# Patient Record
Sex: Female | Born: 1990 | Race: Black or African American | Hispanic: No | Marital: Single | Smoking: Current every day smoker
Health system: Southern US, Community
[De-identification: ages and names within clinical notes are randomized; demographics above are authoritative.]

## PROBLEM LIST (undated history)

## (undated) ENCOUNTER — Inpatient Hospital Stay (HOSPITAL_COMMUNITY): Payer: Self-pay

## (undated) DIAGNOSIS — Z789 Other specified health status: Secondary | ICD-10-CM

## (undated) DIAGNOSIS — D649 Anemia, unspecified: Secondary | ICD-10-CM

## (undated) HISTORY — PX: NO PAST SURGERIES: SHX2092

---
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
## 2012-02-29 ENCOUNTER — Emergency Department (HOSPITAL_COMMUNITY)
Admission: EM | Admit: 2012-02-29 | Discharge: 2012-02-29 | Disposition: A | Discharge status: Home / Self Care | Payer: Medicaid Other | Attending: Emergency Medicine | Admitting: Emergency Medicine

## 2012-02-29 ENCOUNTER — Emergency Department (HOSPITAL_COMMUNITY): Payer: Medicaid Other

## 2012-02-29 ENCOUNTER — Encounter (HOSPITAL_COMMUNITY): Payer: Self-pay | Admitting: Family Medicine

## 2012-02-29 DIAGNOSIS — R05 Cough: Secondary | ICD-10-CM | POA: Insufficient documentation

## 2012-02-29 DIAGNOSIS — IMO0001 Reserved for inherently not codable concepts without codable children: Secondary | ICD-10-CM | POA: Insufficient documentation

## 2012-02-29 DIAGNOSIS — Z79899 Other long term (current) drug therapy: Secondary | ICD-10-CM | POA: Insufficient documentation

## 2012-02-29 DIAGNOSIS — J029 Acute pharyngitis, unspecified: Secondary | ICD-10-CM | POA: Insufficient documentation

## 2012-02-29 DIAGNOSIS — R6889 Other general symptoms and signs: Secondary | ICD-10-CM

## 2012-02-29 DIAGNOSIS — R059 Cough, unspecified: Secondary | ICD-10-CM | POA: Insufficient documentation

## 2012-02-29 LAB — RAPID STREP SCREEN (MED CTR MEBANE ONLY): Streptococcus, Group A Screen (Direct): NEGATIVE

## 2012-02-29 MED ORDER — NAPROXEN 500 MG PO TABS: 500.0000 mg | ORAL_TABLET | Freq: Two times a day (BID) | ORAL | Status: DC

## 2012-02-29 MED ORDER — HYDROCODONE-HOMATROPINE 5-1.5 MG/5ML PO SYRP: 5.0000 mL | ORAL_SOLUTION | Freq: Four times a day (QID) | ORAL | Status: DC | PRN

## 2012-02-29 NOTE — ED Provider Notes (Signed)
History    Scribed for Kristina Cooper III, MD, the patient was seen in room TR06C/TR06C . This chart was scribed by Lewanda Rife.  CSN: [id number]  Arrival date & time 02/29/12  1732   First MD Initiated Contact with Patient 02/29/12 1811      Chief Complaint  Patient presents with  . Cough  . Sore Throat    (Consider location/radiation/quality/duration/timing/severity/associated sxs/prior treatment) HPI Kristina Alvarado is a 22 y.o. female who presents to the Emergency Department complaining of moderate constant generalized myalgias, fatigue, sore throat, non-productive dry cough onset 4 days. Pt denies nausea, vomiting, fever, and diarrhea. Pt denies taking any medications at home to treat symptoms. Pt reports she did not get a flu shot this year.   History reviewed. No pertinent past medical history.  History reviewed. No pertinent past surgical history.  History reviewed. No pertinent family history.  History  Substance Use Topics  . Smoking status: Never Smoker   . Smokeless tobacco: Not on file  . Alcohol Use: No    OB History    Grav Para Term Preterm Abortions TAB SAB Ect Mult Living                  Review of Systems  Constitutional: Negative.  Negative for fever.  HENT: Positive for sore throat. Negative for neck stiffness.   Respiratory: Positive for cough.   Cardiovascular: Negative.  Negative for chest pain.  Gastrointestinal: Negative.  Negative for nausea, vomiting, abdominal pain and diarrhea.  Musculoskeletal: Positive for myalgias (generalized).  Skin: Negative.   Neurological: Negative.   Hematological: Negative.   Psychiatric/Behavioral: Negative.   All other systems reviewed and are negative.    Allergies  Review of patient's allergies indicates no known allergies.  Home Medications   Current Outpatient Rx  Name  Route  Sig  Dispense  Refill  . ACETAMINOPHEN 500 MG PO TABS   Oral   Take 500 mg by mouth every 6 (six) hours as  needed. For pain         . NORGESTIMATE-ETH ESTRADIOL 0.25-35 MG-MCG PO TABS   Oral   Take 1 tablet by mouth daily.         . NYQUIL PO   Oral   Take 30 mLs by mouth daily as needed. For symptoms           BP 121/60  Pulse 69  Temp 98 F (36.7 C)  Resp 18  SpO2 99%  LMP 02/10/2012  Physical Exam  Nursing note and vitals reviewed. Constitutional: She is oriented to person, place, and time. She appears well-developed and well-nourished. No distress.  HENT:  Head: Normocephalic and atraumatic. No trismus in the jaw.  Right Ear: Tympanic membrane, external ear and ear canal normal.  Left Ear: Tympanic membrane, external ear and ear canal normal.  Nose: Nose normal. No rhinorrhea. Right sinus exhibits no maxillary sinus tenderness and no frontal sinus tenderness. Left sinus exhibits no maxillary sinus tenderness and no frontal sinus tenderness.  Mouth/Throat: Uvula is midline and mucous membranes are normal. Normal dentition. No dental abscesses or uvula swelling. Posterior oropharyngeal edema present. No oropharyngeal exudate, posterior oropharyngeal erythema or tonsillar abscesses.       No submental edema, tongue not elevated, no trismus. No impending airway obstruction; Pt able to speak full sentences, swallow intact, no drooling, stridor, or tonsillar/uvula displacement. No palatal petechia  Eyes: Conjunctivae normal are normal.  Neck: Trachea normal, normal range of motion and full passive  range of motion without pain. Neck supple. No rigidity. Normal range of motion present. No Brudzinski's sign noted.       Flexion and extension of neck without pain or difficulty. Able to breath without difficulty in extension.  Cardiovascular: Normal rate and regular rhythm.   Pulmonary/Chest: Effort normal and breath sounds normal. No stridor. No respiratory distress. She has no wheezes.  Abdominal: Soft. There is no tenderness.       No obvious evidence of splenomegaly. Non ttp.     Musculoskeletal: Normal range of motion.  Lymphadenopathy:       Head (right side): No preauricular and no posterior auricular adenopathy present.       Head (left side): No preauricular and no posterior auricular adenopathy present.    She has no cervical adenopathy.  Neurological: She is alert and oriented to person, place, and time.  Skin: Skin is warm and dry. No rash noted. She is not diaphoretic.  Psychiatric: She has a normal mood and affect.    ED Course  Procedures (including critical care time)   Labs Reviewed  RAPID STREP SCREEN   No results found.   No diagnosis found.    MDM  Pt CXR negative for acute infiltrate. Patients symptoms are consistent with URI, likely viral etiology. Discussed that antibiotics are not indicated for viral infections. Pt will be discharged with symptomatic treatment.  Verbalizes understanding and is agreeable with plan. Pt is hemodynamically stable & in NAD prior to dc.       I personally performed the services described in this documentation, which was scribed in my presence. The recorded information has been reviewed and is accurate.      Jaci Carrel, [place] 02/29/12 1914

## 2012-02-29 NOTE — ED Notes (Signed)
Per pt sore throat, achy and cough.

## 2012-03-01 NOTE — ED Provider Notes (Signed)
Medical screening examination/treatment/procedure(s) were performed by non-physician practitioner and as supervising physician I was immediately available for consultation/collaboration.   [name], MD 03/01/12 [phone]

## 2012-09-19 ENCOUNTER — Emergency Department (HOSPITAL_COMMUNITY)
Admission: EM | Admit: 2012-09-19 | Discharge: 2012-09-19 | Disposition: A | Discharge status: Home / Self Care | Payer: Medicaid Other | Attending: Emergency Medicine | Admitting: Emergency Medicine

## 2012-09-19 ENCOUNTER — Encounter (HOSPITAL_COMMUNITY): Payer: Self-pay

## 2012-09-19 ENCOUNTER — Emergency Department (HOSPITAL_COMMUNITY): Payer: Medicaid Other

## 2012-09-19 DIAGNOSIS — M549 Dorsalgia, unspecified: Secondary | ICD-10-CM | POA: Insufficient documentation

## 2012-09-19 DIAGNOSIS — R0602 Shortness of breath: Secondary | ICD-10-CM | POA: Insufficient documentation

## 2012-09-19 DIAGNOSIS — J3489 Other specified disorders of nose and nasal sinuses: Secondary | ICD-10-CM | POA: Insufficient documentation

## 2012-09-19 DIAGNOSIS — R040 Epistaxis: Secondary | ICD-10-CM | POA: Insufficient documentation

## 2012-09-19 DIAGNOSIS — J40 Bronchitis, not specified as acute or chronic: Secondary | ICD-10-CM

## 2012-09-19 DIAGNOSIS — Z79899 Other long term (current) drug therapy: Secondary | ICD-10-CM | POA: Insufficient documentation

## 2012-09-19 DIAGNOSIS — J209 Acute bronchitis, unspecified: Secondary | ICD-10-CM | POA: Insufficient documentation

## 2012-09-19 MED ORDER — AZITHROMYCIN 250 MG PO TABS: 250.0000 mg | ORAL_TABLET | Freq: Every day | ORAL | Status: DC

## 2012-09-19 MED ORDER — BENZONATATE 100 MG PO CAPS: 100.0000 mg | ORAL_CAPSULE | Freq: Three times a day (TID) | ORAL | Status: DC

## 2012-09-19 MED ORDER — AZITHROMYCIN 250 MG PO TABS
500.0000 mg | ORAL_TABLET | Freq: Once | ORAL | Status: AC
  Administered 2012-09-19: 500 mg via ORAL
  Filled 2012-09-19: qty 2

## 2012-09-19 NOTE — ED Provider Notes (Signed)
CSN: [id number]     Arrival date & time 09/19/12  1646 History  This chart was scribed for Tommy Rainwater, non-physician practitioner working with Richardean Canal, MD by Leone Payor, ED Scribe. This patient was seen in room TR08C/TR08C and the patient's care was started at 1646.    Chief Complaint  Patient presents with  . Nasal Congestion  . Cough    The history is provided by the patient. No language interpreter was used.    HPI Comments: Kristina Alvarado is a 22 y.o. female who presents to the Emergency Department complaining of 7 days of gradual onset, gradually worsening, constant cough productive of white/yellow mucus. Pt has associated nasal congestion along with back pain and SOB while coughing. Pt also had a nosebleed that lasted about 3 minutes today. She has taken robitussin with minimal relief. She denies fevers. Pt denies history of asthma but both of her parents do. Pt denies smoking and alcohol use.   History reviewed. No pertinent past medical history. History reviewed. No pertinent past surgical history. History reviewed. No pertinent family history. History  Substance Use Topics  . Smoking status: Never Smoker   . Smokeless tobacco: Not on file  . Alcohol Use: No   OB History   Grav Para Term Preterm Abortions TAB SAB Ect Mult Living                 Review of Systems  Constitutional: Negative for fever.  HENT: Positive for nosebleeds (1 episode) and congestion.   Respiratory: Positive for cough and shortness of breath.   All other systems reviewed and are negative.    Allergies  Review of patient's allergies indicates no known allergies.  Home Medications   Current Outpatient Rx  Name  Route  Sig  Dispense  Refill  . camphor-phenol (CAMPHO-PHENIQUE) 10.8-4.7 % LIQD   Topical   Apply 1 application topically daily as needed (for cold sore).         . cetirizine (ZYRTEC) 10 MG tablet   Oral   Take 10 mg by mouth every other day.         .  ethynodiol-ethinyl estradiol (ZOVIA) 1-50 MG-MCG tablet   Oral   Take 1 tablet by mouth daily.         Marland Kitchen guaiFENesin (ROBITUSSIN) 100 MG/5ML SOLN   Oral   Take 200 mg by mouth every 4 (four) hours as needed (for cough).         Marland Kitchen ibuprofen (ADVIL,MOTRIN) 400 MG tablet   Oral   Take 400 mg by mouth 2 (two) times daily as needed for pain.          BP 143/60  Pulse 71  Temp(Src) 98.7 F (37.1 C) (Oral)  Resp 18  SpO2 100%  LMP 08/19/2012 Physical Exam  Nursing note and vitals reviewed. Constitutional: She is oriented to person, place, and time. She appears well-developed and well-nourished.  HENT:  Head: Normocephalic and atraumatic.  Right Ear: Tympanic membrane normal.  Left Ear: Tympanic membrane normal.  Mouth/Throat: Oropharynx is clear and moist.  Eyes: Conjunctivae and EOM are normal. Pupils are equal, round, and reactive to light.  Neck: Normal range of motion. Neck supple.  Cardiovascular: Normal rate, regular rhythm and normal heart sounds.   Pulmonary/Chest: Effort normal. She has no wheezes. She has rales (Diffuse rales bilaterally. ).  Actively coughing.  Abdominal: Soft. Bowel sounds are normal. She exhibits no distension.  Musculoskeletal: Normal range of motion.  Neurological: She is alert and oriented to person, place, and time.  Skin: Skin is warm and dry.  Psychiatric: She has a normal mood and affect.    ED Course   Procedures (including critical care time)  DIAGNOSTIC STUDIES: Oxygen Saturation is 100% on RA, normal by my interpretation.    COORDINATION OF CARE: 6:02 PM Discussed treatment plan with pt at bedside and pt agreed to plan.   Labs Reviewed - No data to display Dg Chest 2 View  09/19/2012   *RADIOLOGY REPORT*  Clinical Data: Cough, shortness of breath  CHEST - 2 VIEW  Comparison: 02/29/2012  Findings: Cardiomediastinal silhouette is within normal limits. The lungs are clear. No pleural effusion.  No pneumothorax.  No acute osseous  abnormality.  IMPRESSION: Normal chest.   Original Report Authenticated By: Christiana Pellant, M.D.   No diagnosis found. 1. Bronchitis  MDM  Will treat with abx given presentation and duration of symptoms. Otherwise, supportive care.   I personally performed the services described in this documentation, which was scribed in my presence. The recorded information has been reviewed and is accurate.     Arnoldo Hooker, PA-C 09/19/12 2305

## 2012-09-19 NOTE — Discharge Instructions (Signed)
Bronchitis  Bronchitis is a problem of the air tubes leading to your lungs. This problem makes it hard for air to get in and out of the lungs. You may cough a lot because your air tubes are narrow. Going without care can cause lasting (chronic) bronchitis.  HOME CARE    Drink enough fluids to keep your pee (urine) clear or pale yellow.   Use a cool mist humidifier.   Quit smoking if you smoke. If you keep smoking, the bronchitis might not get better.   Only take medicine as told by your doctor.  GET HELP RIGHT AWAY IF:    Coughing keeps you awake.   You start to wheeze.   You become more sick or weak.   You have a hard time breathing or get short of breath.   You cough up blood.   Coughing lasts more than 2 weeks.   You have a fever.   Your baby is older than 3 months with a rectal temperature of 102 F (38.9 C) or higher.   Your baby is 3 months old or younger with a rectal temperature of 100.4 F (38 C) or higher.  MAKE SURE YOU:   Understand these instructions.   Will watch your condition.   Will get help right away if you are not doing well or get worse.  Document Released: 07/16/2007 Document Revised: 04/21/2011 Document Reviewed: 12/29/2008  [employer] Patient Information 2014 [employer].

## 2012-09-19 NOTE — ED Notes (Signed)
Pt c/o productive cough with white/yellow colored mucus, nasal congestion, pian to her back and SOB when coughing, pt reports having a nosebleed today lasting 3 minutes

## 2012-09-20 NOTE — ED Provider Notes (Signed)
Medical screening examination/treatment/procedure(s) were performed by non-physician practitioner and as supervising physician I was immediately available for consultation/collaboration.   Richardean Canal, MD 09/20/12 0001

## 2013-01-13 ENCOUNTER — Ambulatory Visit: Payer: Self-pay | Admitting: Obstetrics & Gynecology

## 2013-01-19 ENCOUNTER — Ambulatory Visit: Payer: Self-pay | Admitting: Obstetrics & Gynecology

## 2014-02-06 ENCOUNTER — Encounter: Payer: Self-pay | Admitting: *Deleted

## 2014-12-03 ENCOUNTER — Emergency Department (HOSPITAL_COMMUNITY)
Admission: EM | Admit: 2014-12-03 | Discharge: 2014-12-03 | Disposition: A | Discharge status: Home / Self Care | Payer: Medicaid Other | Attending: Emergency Medicine | Admitting: Emergency Medicine

## 2014-12-03 ENCOUNTER — Encounter (HOSPITAL_COMMUNITY): Payer: Self-pay | Admitting: Emergency Medicine

## 2014-12-03 DIAGNOSIS — K0889 Other specified disorders of teeth and supporting structures: Secondary | ICD-10-CM

## 2014-12-03 DIAGNOSIS — Z792 Long term (current) use of antibiotics: Secondary | ICD-10-CM | POA: Insufficient documentation

## 2014-12-03 DIAGNOSIS — Z79899 Other long term (current) drug therapy: Secondary | ICD-10-CM | POA: Insufficient documentation

## 2014-12-03 DIAGNOSIS — Z3202 Encounter for pregnancy test, result negative: Secondary | ICD-10-CM | POA: Insufficient documentation

## 2014-12-03 LAB — POC URINE PREG, ED: PREG TEST UR: NEGATIVE

## 2014-12-03 MED ORDER — PENICILLIN V POTASSIUM 250 MG PO TABS: 250.0000 mg | ORAL_TABLET | Freq: Four times a day (QID) | ORAL | Status: AC

## 2014-12-03 NOTE — ED Notes (Signed)
Pt. Sytated, I've had toothache on both sides for 2 months.

## 2014-12-03 NOTE — ED Notes (Signed)
Declined W/C at D/C and was escorted to lobby by RN. 

## 2014-12-03 NOTE — ED Provider Notes (Signed)
CSN: [id number]     Arrival date & time 12/03/14  1118 History  By signing my name below, I, Placido SouLogan Joldersma, attest that this documentation has been prepared under the direction and in the presence of Teressa LowerVrinda Tyja Gortney, NP. Electronically Signed: Placido SouLogan Joldersma, ED Scribe. 12/03/2014. 12:34 PM.   Chief Complaint  Patient presents with  . Dental Problem   The history is provided by the patient. No language interpreter was used.    HPI Comments: Kristina Alvarado is a 24 y.o. female who presents to the Emergency Department complaining of moderate right lower and left upper dental pain with onset 1 month ago and worsening pain beginning yesterday. She notes worsening pain when chewing and denies having seen a dentist for these symptoms. Pt denies any known drug allergies and notes her LNMP was 2-3 months ago which she says is abnormal. She denies fever and trouble swallowing.   History reviewed. No pertinent past medical history. History reviewed. No pertinent past surgical history. No family history on file. Social History  Substance Use Topics  . Smoking status: Never Smoker   . Smokeless tobacco: None  . Alcohol Use: No   OB History    No data available     Review of Systems  HENT: Positive for dental problem.   All other systems reviewed and are negative.  Allergies  Review of patient's allergies indicates no known allergies.  Home Medications   Prior to Admission medications   Medication Sig Start Date End Date Taking? Authorizing Provider  azithromycin (ZITHROMAX Z-PAK) 250 MG tablet Take 1 tablet (250 mg total) by mouth daily. 09/19/12   Elpidio AnisShari Upstill, PA-C  benzonatate (TESSALON) 100 MG capsule Take 1 capsule (100 mg total) by mouth every 8 (eight) hours. 09/19/12   Elpidio AnisShari Upstill, PA-C  camphor-phenol (CAMPHO-PHENIQUE) 10.8-4.7 % LIQD Apply 1 application topically daily as needed (for cold sore).    Historical Provider, MD  cetirizine (ZYRTEC) 10 MG tablet Take 10 mg by  mouth every other day.    Historical Provider, MD  ethynodiol-ethinyl estradiol (ZOVIA) 1-50 MG-MCG tablet Take 1 tablet by mouth daily.    Historical Provider, MD  guaiFENesin (ROBITUSSIN) 100 MG/5ML SOLN Take 200 mg by mouth every 4 (four) hours as needed (for cough).    Historical Provider, MD  ibuprofen (ADVIL,MOTRIN) 400 MG tablet Take 400 mg by mouth 2 (two) times daily as needed for pain.    Historical Provider, MD   BP 122/76 mmHg  Pulse 67  Temp(Src) 98.7 F (37.1 C)  Resp 17  Ht 4' 11.5" (1.511 m)  Wt 201 lb (91.173 kg)  BMI 39.93 kg/m2  SpO2 98%  LMP 10/03/2014 Physical Exam  Constitutional: She is oriented to person, place, and time. She appears well-developed and well-nourished.  HENT:  Head: Normocephalic and atraumatic.  Right Ear: External ear normal.  Left Ear: External ear normal.  Mouth/Throat: No oropharyngeal exudate.  No swelling noted. Pt has multiple decayed or filled teeth  Neck: Normal range of motion. No tracheal deviation present.  Cardiovascular: Normal rate.   Pulmonary/Chest: Effort normal. No respiratory distress.  Abdominal: Soft. There is no tenderness.  Musculoskeletal: Normal range of motion.  Neurological: She is alert and oriented to person, place, and time.  Skin: Skin is warm and dry. She is not diaphoretic.  Psychiatric: She has a normal mood and affect. Her behavior is normal.  Nursing note and vitals reviewed.  ED Course  Procedures  DIAGNOSTIC STUDIES: Oxygen Saturation is 98% on  RA, normal by my interpretation.    COORDINATION OF CARE: 12:32 PM Pt presents today due to worsening right lower and left upper dental pain. Discussed treatment plan with pt at bedside including a UA, abx and a referral to a dental provider. Return precautions noted. Pt agreed to plan.  Labs Review Labs Reviewed - No data to display  Imaging Review No results found.   EKG Interpretation None      MDM   Final diagnoses:  Toothache    Pt  given pcn. No sign of ludwigs angina or abscess.  I personally performed the services described in this documentation, which was scribed in my presence. The recorded information has been reviewed and is accurate.    Teressa Lower, NP 12/03/14 1312  Richardean Canal, MD 12/03/14 1520

## 2015-08-12 ENCOUNTER — Emergency Department (HOSPITAL_COMMUNITY)
Admission: EM | Admit: 2015-08-12 | Discharge: 2015-08-12 | Disposition: A | Discharge status: Home / Self Care | Payer: Medicaid Other | Attending: Emergency Medicine | Admitting: Emergency Medicine

## 2015-08-12 ENCOUNTER — Encounter (HOSPITAL_COMMUNITY): Payer: Self-pay

## 2015-08-12 DIAGNOSIS — N644 Mastodynia: Secondary | ICD-10-CM | POA: Insufficient documentation

## 2015-08-12 LAB — POC URINE PREG, ED: Preg Test, Ur: NEGATIVE

## 2015-08-12 MED ORDER — DICLOFENAC SODIUM 1 % TD GEL: 2.0000 g | Freq: Four times a day (QID) | TRANSDERMAL | Status: DC

## 2015-08-12 MED ORDER — KETOROLAC TROMETHAMINE 60 MG/2ML IM SOLN
60.0000 mg | Freq: Once | INTRAMUSCULAR | Status: AC
  Administered 2015-08-12: 60 mg via INTRAMUSCULAR
  Filled 2015-08-12: qty 2

## 2015-08-12 NOTE — ED Notes (Signed)
Declined W/C at D/C and was escorted to lobby by RN. 

## 2015-08-12 NOTE — Discharge Instructions (Signed)
You have been seen today for breast tenderness. Your pregnancy test was negative. It is very important that you follow-up with OB/GYN as soon as possible. Call the number provided tomorrow to set up an appointment. Ibuprofen or naproxen for pain and inflammation. Alternatively, he may use the topical anti-inflammatory prescribed today. Do not use both topical anti-inflammatory and oral anti-inflammatory medications at the same time. Follow up with PCP as needed.

## 2015-08-12 NOTE — ED Provider Notes (Signed)
CSN: [id number]     Arrival date & time 08/12/15  0844 History   First MD Initiated Contact with Patient 08/12/15 [phone]     Chief Complaint  Patient presents with  . breast pain      (Consider location/radiation/quality/duration/timing/severity/associated sxs/prior Treatment) HPI   Kristina Alvarado is a 25 y.o. female, patient with no pertinent past medical history, presenting to the ED with Bilateral breast tenderness for the last 5 days. Patient states that it started with bilateral nipple sensitivity and spread to the rest of her breasts. Pain with lying prone and palpation. Patient has not tried anything for her symptoms. Denies fever/chills, nausea/vomiting, shortness of breath, nipple discharge, or any other complaints. LMP around May 25. Patient endorses irregular menses.   History reviewed. No pertinent past medical history. History reviewed. No pertinent past surgical history. No family history on file. Social History  Substance Use Topics  . Smoking status: Never Smoker   . Smokeless tobacco: None  . Alcohol Use: No   OB History    No data available     Review of Systems  Constitutional: Negative for fever and chills.  Respiratory: Negative for cough and shortness of breath.   Cardiovascular: Negative for chest pain.  Skin: Negative for color change, rash and wound.       Breast tenderness bilaterally.  All other systems reviewed and are negative.     Allergies  Review of patient's allergies indicates no known allergies.  Home Medications   Prior to Admission medications   Medication Sig Start Date End Date Taking? Authorizing Provider  azithromycin (ZITHROMAX Z-PAK) 250 MG tablet Take 1 tablet (250 mg total) by mouth daily. 09/19/12   Elpidio AnisShari Upstill, PA-C  benzonatate (TESSALON) 100 MG capsule Take 1 capsule (100 mg total) by mouth every 8 (eight) hours. 09/19/12   Elpidio AnisShari Upstill, PA-C  camphor-phenol (CAMPHO-PHENIQUE) 10.8-4.7 % LIQD Apply 1 application  topically daily as needed (for cold sore).    Historical Provider, MD  cetirizine (ZYRTEC) 10 MG tablet Take 10 mg by mouth every other day.    Historical Provider, MD  diclofenac sodium (VOLTAREN) 1 % GEL Apply 2 g topically 4 (four) times daily. 08/12/15   Mariadel Mruk C Abeer Deskins, PA-C  ethynodiol-ethinyl estradiol (ZOVIA) 1-50 MG-MCG tablet Take 1 tablet by mouth daily.    Historical Provider, MD  guaiFENesin (ROBITUSSIN) 100 MG/5ML SOLN Take 200 mg by mouth every 4 (four) hours as needed (for cough).    Historical Provider, MD  ibuprofen (ADVIL,MOTRIN) 400 MG tablet Take 400 mg by mouth 2 (two) times daily as needed for pain.    Historical Provider, MD   BP 129/71 mmHg  Pulse 70  Temp(Src) 99.2 F (37.3 C)  Resp 18  Ht 4\' 11"  (1.499 m)  Wt 87.408 kg  BMI 38.90 kg/m2  SpO2 99%  LMP 07/10/2015 Physical Exam  Constitutional: She appears well-developed and well-nourished. No distress.  HENT:  Head: Normocephalic and atraumatic.  Eyes: Conjunctivae are normal.  Neck: Neck supple.  Cardiovascular: Normal rate, regular rhythm, normal heart sounds and intact distal pulses.   Pulmonary/Chest: Effort normal and breath sounds normal. No respiratory distress.  Large, pendulous breasts. Relatively equal to one another. Diffuse, bilateral, exquisite breast tenderness. Tenderness seems to be limited to the breast tissue. Tenderness is elicited with light or deep palpation.  No obvious masses, areas of entrapment, or dimpling noted. No thickened skin, abnormal texture, or erythema noted. No nipple discharge. No specific area of fluctuance or induration.  Chest wall itself seems to be nontender. No tenderness in the axilla. RN, Magda PaganiniAudrey, served as chaperone during exam.  Abdominal: Soft. There is no tenderness. There is no guarding.  Musculoskeletal: She exhibits no edema.  Lymphadenopathy:    She has no cervical adenopathy.  Neurological: She is alert.  Skin: Skin is warm and dry. She is not diaphoretic.   Psychiatric: She has a normal mood and affect. Her behavior is normal.  Nursing note and vitals reviewed.   ED Course  Procedures (including critical care time) Labs Review Labs Reviewed  POC URINE PREG, ED    I have personally reviewed and evaluated these lab results as part of my medical decision-making.   EKG Interpretation None      MDM   Final diagnoses:  Breast pain    Kristina Alvarado presents with bilateral breast pain for the last 5 days.  Negative pregnancy test. No abnormalities other than tenderness found on exam. Patient advised to follow-up with OB/GYN as soon as possible for further testing. Resources given. Diclofenac gel for symptom control. Return precautions discussed. Patient voiced understanding instructions, accepts the plan, and is comfortable with discharge.      Anselm PancoastShawn C Leila Schuff, PA-C 08/12/15 [id number]  Mancel BaleElliott Wentz, MD 08/12/15 [phone]

## 2015-08-12 NOTE — ED Notes (Signed)
Patient complains of bilateral breast pain x 5 days, denies trauma, NAD

## 2016-07-16 ENCOUNTER — Encounter (HOSPITAL_COMMUNITY): Payer: Self-pay | Admitting: Emergency Medicine

## 2016-07-16 ENCOUNTER — Emergency Department (HOSPITAL_COMMUNITY)
Admission: EM | Admit: 2016-07-16 | Discharge: 2016-07-16 | Disposition: A | Discharge status: Home / Self Care | Payer: Medicaid Other | Attending: Emergency Medicine | Admitting: Emergency Medicine

## 2016-07-16 DIAGNOSIS — Z792 Long term (current) use of antibiotics: Secondary | ICD-10-CM | POA: Insufficient documentation

## 2016-07-16 DIAGNOSIS — Z79899 Other long term (current) drug therapy: Secondary | ICD-10-CM | POA: Insufficient documentation

## 2016-07-16 DIAGNOSIS — Z793 Long term (current) use of hormonal contraceptives: Secondary | ICD-10-CM | POA: Insufficient documentation

## 2016-07-16 DIAGNOSIS — L02411 Cutaneous abscess of right axilla: Secondary | ICD-10-CM | POA: Insufficient documentation

## 2016-07-16 MED ORDER — DOXYCYCLINE HYCLATE 100 MG PO CAPS: 100.0000 mg | ORAL_CAPSULE | Freq: Two times a day (BID) | ORAL | 0 refills | Status: DC

## 2016-07-16 MED ORDER — LIDOCAINE-EPINEPHRINE (PF) 2 %-1:200000 IJ SOLN
10.0000 mL | Freq: Once | INTRAMUSCULAR | Status: AC
  Administered 2016-07-16: 10 mL via INTRADERMAL
  Filled 2016-07-16: qty 20

## 2016-07-16 MED ORDER — IBUPROFEN 600 MG PO TABS: 600.0000 mg | ORAL_TABLET | Freq: Four times a day (QID) | ORAL | 0 refills | Status: DC | PRN

## 2016-07-16 NOTE — ED Provider Notes (Signed)
[facility]-EMERGENCY DEPT Provider Note   CSN: [id number] Arrival date & time: 07/16/16  1356   By signing my name below, I, Kristina Alvarado, attest that this documentation has been prepared under the direction and in the presence of Tank Difiore, PA-C. Electronically Signed: Freida Alvarado, Scribe. 07/16/2016. 2:40 PM.  History   Chief Complaint Chief Complaint  Patient presents with  . Abscess    The history is provided by the patient. No language interpreter was used.     HPI Comments:  Kristina Alvarado is a 26 y.o. female who presents to the Emergency Department complaining of a painful raised area to the right axilla that has been present for 1 week. She reports h/o same ~ 3 weeks ago that was smaller and popped on its own. She has applied warm compresses with minimal improvement.  She denies h/o I&D for this issue. Pt has no other acute complaints or associated symptoms at this time.   History reviewed. No pertinent past medical history.  There are no active problems to display for this patient.   History reviewed. No pertinent surgical history.  OB History    No data available       Home Medications    Prior to Admission medications   Medication Sig Start Date End Date Taking? Authorizing Provider  azithromycin (ZITHROMAX Z-PAK) 250 MG tablet Take 1 tablet (250 mg total) by mouth daily. 09/19/12   Elpidio Anis, PA-C  benzonatate (TESSALON) 100 MG capsule Take 1 capsule (100 mg total) by mouth every 8 (eight) hours. 09/19/12   Elpidio Anis, PA-C  camphor-phenol (CAMPHO-PHENIQUE) 10.8-4.7 % LIQD Apply 1 application topically daily as needed (for cold sore).    [provider]  cetirizine (ZYRTEC) 10 MG tablet Take 10 mg by mouth every other day.    [provider]  diclofenac sodium (VOLTAREN) 1 % GEL Apply 2 g topically 4 (four) times daily. 08/12/15   Joy, Shawn C, PA-C  ethynodiol-ethinyl estradiol (ZOVIA) 1-50 MG-MCG tablet Take 1 tablet by mouth  daily.    [provider]  guaiFENesin (ROBITUSSIN) 100 MG/5ML SOLN Take 200 mg by mouth every 4 (four) hours as needed (for cough).    [provider]  ibuprofen (ADVIL,MOTRIN) 400 MG tablet Take 400 mg by mouth 2 (two) times daily as needed for pain.    [provider]    Family History No family history on file.  Social History Social History  Substance Use Topics  . Smoking status: Never Smoker  . Smokeless tobacco: Not on file  . Alcohol use No     Allergies   Patient has no known allergies.   Review of Systems Review of Systems  Constitutional: Negative for fever.  Skin: Positive for rash.     Physical Exam Updated Vital Signs BP 114/81   Pulse 60   Temp 98.7 F (37.1 C) (Oral)   Resp 16   Ht 5' (1.524 m)   Wt 190 lb (86.2 kg)   LMP  (LMP Unknown)   SpO2 98%   BMI 37.11 kg/m   Physical Exam  Constitutional: She is oriented to person, place, and time. She appears well-developed and well-nourished. No distress.  HENT:  Head: Normocephalic and atraumatic.  Eyes: Conjunctivae are normal.  Cardiovascular: Normal rate.   Pulmonary/Chest: Effort normal.  Abdominal: She exhibits no distension.  Neurological: She is alert and oriented to person, place, and time.  Skin: Skin is warm and dry.  4 x 4 centimeter abscess  to the right axilla, skin is erythematous, fluctuant, tender to palpation. No drainage  Nursing note and vitals reviewed.    ED Treatments / Results  DIAGNOSTIC STUDIES:  Oxygen Saturation is 98% on RA, normal by my interpretation.    COORDINATION OF CARE:  2:39 PM Discussed treatment plan with pt at bedside and pt agreed to plan.  Labs (all labs ordered are listed, but only abnormal results are displayed) Labs Reviewed - No data to display  EKG  EKG Interpretation None       Radiology No results found.  Procedures Procedures (including critical care time)  INCISION AND DRAINAGE Performed by:  Jaynie CrumbleKIRICHENKO, Menachem Urbanek A Consent: Verbal consent obtained. Risks and benefits: risks, benefits and alternatives were discussed Type: abscess  Body area: right axilla  Anesthesia: local infiltration  Incision was made with a scalpel.  Local anesthetic: lidocaine 2% w epinephrine  Anesthetic total: 3 ml  Complexity: complex Blunt dissection to break up loculations  Drainage: purulent  Drainage amount: copious  Packing material: 1/4 in iodoform gauze  Patient tolerance: Patient tolerated the procedure well with no immediate complications.    Medications Ordered in ED Medications - No data to display   Initial Impression / Assessment and Plan / ED Course  I have reviewed the triage vital signs and the nursing notes.  Pertinent labs & imaging results that were available during my care of the patient were reviewed by me and considered in my medical decision making (see chart for details).      Patient in the emergency department with large abscess to the right axilla. Incised and drained. Will start on doxycycline. Ibuprofen for pain. Follow-up as needed. Patient is otherwise afebrile, nontoxic appearing. No evidence of systemic infection.  Vitals:   07/16/16 1412  BP: 114/81  Pulse: 60  Resp: 16  Temp: 98.7 F (37.1 C)  TempSrc: Oral  SpO2: 98%  Weight: 86.2 kg (190 lb)  Height: 5' (1.524 m)      Final Clinical Impressions(s) / ED Diagnoses   Final diagnoses:  Abscess of right axilla    New Prescriptions New Prescriptions   DOXYCYCLINE (VIBRAMYCIN) 100 MG CAPSULE    Take 1 capsule (100 mg total) by mouth 2 (two) times daily.   IBUPROFEN (ADVIL,MOTRIN) 600 MG TABLET    Take 1 tablet (600 mg total) by mouth every 6 (six) hours as needed.   I personally performed the services described in this documentation, which was scribed in my presence. The recorded information has been reviewed and is accurate.    Jaynie CrumbleKirichenko, Bracken Moffa, PA-C 07/16/16 1528      Nira Connardama, Pedro Eduardo, MD 07/19/16 [phone]

## 2016-07-16 NOTE — Discharge Instructions (Signed)
Warm compresses or soaks several times a day. Take ibuprofen for pain. Doxycycline for infection until all gone. Pull packing out in 2 days. Return for recheck or follow up as needed.

## 2016-07-16 NOTE — ED Notes (Signed)
Lidocaine at bedside.

## 2016-07-16 NOTE — ED Triage Notes (Signed)
Pt states shes had two abscess under her R armpit for one month, painful to move her arm. Smaller abscess popped on its own. No fevers.

## 2016-08-14 ENCOUNTER — Encounter (HOSPITAL_COMMUNITY): Payer: Self-pay

## 2016-08-14 ENCOUNTER — Emergency Department (HOSPITAL_COMMUNITY)
Admission: EM | Admit: 2016-08-14 | Discharge: 2016-08-14 | Disposition: A | Discharge status: Home / Self Care | Payer: Medicaid Other | Attending: Emergency Medicine | Admitting: Emergency Medicine

## 2016-08-14 DIAGNOSIS — L02211 Cutaneous abscess of abdominal wall: Secondary | ICD-10-CM | POA: Insufficient documentation

## 2016-08-14 DIAGNOSIS — L0291 Cutaneous abscess, unspecified: Secondary | ICD-10-CM

## 2016-08-14 LAB — POC URINE PREG, ED: Preg Test, Ur: NEGATIVE

## 2016-08-14 MED ORDER — LIDOCAINE HCL 2 % IJ SOLN
20.0000 mL | Freq: Once | INTRAMUSCULAR | Status: AC
  Administered 2016-08-14: 400 mg
  Filled 2016-08-14: qty 20

## 2016-08-14 MED ORDER — SULFAMETHOXAZOLE-TRIMETHOPRIM 800-160 MG PO TABS: 1.0000 | ORAL_TABLET | Freq: Two times a day (BID) | ORAL | 0 refills | Status: AC

## 2016-08-14 NOTE — ED Provider Notes (Signed)
[facility] Provider Note   CSN: [id number] Arrival date & time: 08/14/16  1047  By signing my name below, I, Kristina Alvarado, attest that this documentation has been prepared under the direction and in the presence of [employer], PA-C. Electronically Signed: Linna Alvarado, Scribe. 08/14/2016. 12:34 PM.  History   Chief Complaint Chief Complaint  Patient presents with  . Abscess   The history is provided by the patient. No language interpreter was used.    HPI Comments: Kristina Alvarado is a 26 y.o. female who presents to the Emergency Department complaining of a moderate, gradually worsening area of pain and swelling just above the umbilicus beginning four days ago. Patient believes she may have been bitten by an insect but has not visualized any insects on her person recently. There are no alleviating factors noted. Her pain is worse with applied pressure. Patient had a right axillary abscess lanced about one month ago but otherwise has no prior h/o abscesses. She endorses some possibility of pregnancy and notes that her LMP was last April. No IV drug use or h/o MRSA. NKDA. She denies fevers, chills, or any other associated symptoms.  History reviewed. No pertinent past medical history.  There are no active problems to display for this patient.   History reviewed. No pertinent surgical history.  OB History    No data available       Home Medications    Prior to Admission medications   Medication Sig Start Date End Date Taking? Authorizing Provider  azithromycin (ZITHROMAX Z-PAK) 250 MG tablet Take 1 tablet (250 mg total) by mouth daily. 09/19/12   Elpidio Anis, PA-C  benzonatate (TESSALON) 100 MG capsule Take 1 capsule (100 mg total) by mouth every 8 (eight) hours. 09/19/12   Elpidio Anis, PA-C  camphor-phenol (CAMPHO-PHENIQUE) 10.8-4.7 % LIQD Apply 1 application topically daily as needed (for cold sore).    [provider]  cetirizine (ZYRTEC) 10 MG tablet  Take 10 mg by mouth every other day.    [provider]  diclofenac sodium (VOLTAREN) 1 % GEL Apply 2 g topically 4 (four) times daily. 08/12/15   Joy, Shawn C, PA-C  doxycycline (VIBRAMYCIN) 100 MG capsule Take 1 capsule (100 mg total) by mouth 2 (two) times daily. 07/16/16   Kirichenko, Lemont Fillers, PA-C  ethynodiol-ethinyl estradiol (ZOVIA) 1-50 MG-MCG tablet Take 1 tablet by mouth daily.    [provider]  guaiFENesin (ROBITUSSIN) 100 MG/5ML SOLN Take 200 mg by mouth every 4 (four) hours as needed (for cough).    [provider]  ibuprofen (ADVIL,MOTRIN) 600 MG tablet Take 1 tablet (600 mg total) by mouth every 6 (six) hours as needed. 07/16/16   Kirichenko, Tatyana, PA-C  sulfamethoxazole-trimethoprim (BACTRIM DS,SEPTRA DS) 800-160 MG tablet Take 1 tablet by mouth 2 (two) times daily. 08/14/16 08/21/16  Eyvonne Mechanic, PA-C    Family History No family history on file.  Social History Social History  Substance Use Topics  . Smoking status: Never Smoker  . Smokeless tobacco: Never Used  . Alcohol use No     Allergies   Patient has no known allergies.   Review of Systems Review of Systems  Constitutional: Negative for chills and fever.  Skin: Positive for wound.  All other systems reviewed and are negative.  Physical Exam Updated Vital Signs BP 120/83 (BP Location: Left Arm)   Pulse 81   Temp 98.6 F (37 C) (Oral)   Resp 18   Ht 5' (1.524 m)  Wt 85.3 kg (188 lb)   LMP 05/15/2016   SpO2 100%   BMI 36.72 kg/m   Physical Exam  Constitutional: She is oriented to person, place, and time. She appears well-developed and well-nourished. No distress.  HENT:  Head: Normocephalic and atraumatic.  Eyes: Conjunctivae and EOM are normal.  Neck: Neck supple. No tracheal deviation present.  Cardiovascular: Normal rate.   Pulmonary/Chest: Effort normal. No respiratory distress.  Musculoskeletal: Normal range of motion.  Neurological: She is alert and oriented  to person, place, and time.  Skin: Skin is warm and dry.  1.5 cm abscess to the superior periumbilical region with fluctuance noted. Minor surrounding redness.  Psychiatric: She has a normal mood and affect. Her behavior is normal.  Nursing note and vitals reviewed.  ED Treatments / Results  Labs (all labs ordered are listed, but only abnormal results are displayed) Labs Reviewed  POC URINE PREG, ED    EKG  EKG Interpretation None       Radiology No results found.  Procedures .Marland Kitchen.Incision and Drainage Date/Time: 08/14/2016 1:24 PM Performed by: Curlene DolphinHEDGES, Therisa Mennella Authorized by: Curlene DolphinHEDGES, Fayrene Towner   Consent:    Consent obtained:  Verbal   Consent given by:  Patient Universal protocol:    Procedure explained and questions answered to patient or proxy's satisfaction: yes   Location:    Type:  Abscess   Location:  Trunk   Trunk location:  Abdomen Pre-procedure details:    Skin preparation:  Betadine Anesthesia (see MAR for exact dosages):    Anesthesia method:  Local infiltration   Local anesthetic:  Lidocaine 2% w/o epi Procedure type:    Complexity:  Simple Procedure details:    Incision types:  Single straight   Incision depth:  Dermal   Wound management:  Probed and deloculated   Drainage:  Purulent   Drainage amount:  Copious   Wound treatment:  Wound left open   Packing materials:  None Post-procedure details:    Patient tolerance of procedure:  Tolerated well, no immediate complications   (including critical care time)  EMERGENCY DEPARTMENT US SOFT TISSUE INTERPRETATION "Study: Limited Soft Tissue Ultrasound"  INDICATIONS: Soft tissue infection Multiple views of the body part were obtained in real-time with a multi-frequency linear probe  PERFORMED BY: Myself IMAGES ARCHIVED?: Yes SIDE:Midline BODY PART:Abdominal wall INTERPRETATION:  Abcess present    DIAGNOSTIC STUDIES: Oxygen Saturation is 100% on RA, normal by my interpretation.    COORDINATION  OF CARE: 12:20 PM Discussed treatment plan with pt at bedside and pt agreed to plan.  Medications Ordered in ED Medications  lidocaine (XYLOCAINE) 2 % (with pres) injection 400 mg (400 mg Infiltration Given 08/14/16 1243)     Initial Impression / Assessment and Plan / ED Course  I have reviewed the triage vital signs and the nursing notes.  Pertinent labs & imaging results that were available during my care of the patient were reviewed by me and considered in my medical decision making (see chart for details).     Abscess I&D here without complication.  Small surrounding cellulitis, patient placed on antibiotics, care instructions given return precautions given.  She verbalized understanding and agreement to today's plan.  Final Clinical Impressions(s) / ED Diagnoses   Final diagnoses:  Abscess    New Prescriptions New Prescriptions   SULFAMETHOXAZOLE-TRIMETHOPRIM (BACTRIM DS,SEPTRA DS) 800-160 MG TABLET    Take 1 tablet by mouth 2 (two) times daily.   I personally performed the services described in this documentation,  which was scribed in my presence. The recorded information has been reviewed and is accurate.   Eyvonne Mechanic, PA-C 08/14/16 1347    Jerelyn Scott, MD 08/14/16 [phone]

## 2016-08-14 NOTE — Discharge Instructions (Signed)
Please read attached information. If you experience any new or worsening signs or symptoms please return to the emergency room for evaluation. Please follow-up with your primary care provider or specialist as discussed. Please use medication prescribed only as directed and discontinue taking if you have any concerning signs or symptoms.   °

## 2016-08-14 NOTE — ED Triage Notes (Signed)
Pt reports she has a "spider bite"above her umbilicus that she noticed 4 days ago.

## 2017-02-10 DIAGNOSIS — Z8759 Personal history of other complications of pregnancy, childbirth and the puerperium: Secondary | ICD-10-CM

## 2017-02-10 HISTORY — DX: Personal history of other complications of pregnancy, childbirth and the puerperium: Z87.59

## 2017-09-10 ENCOUNTER — Emergency Department (HOSPITAL_COMMUNITY): Payer: Medicaid Other

## 2017-09-10 ENCOUNTER — Other Ambulatory Visit: Payer: Self-pay

## 2017-09-10 ENCOUNTER — Encounter (HOSPITAL_COMMUNITY): Payer: Self-pay | Admitting: Emergency Medicine

## 2017-09-10 ENCOUNTER — Ambulatory Visit (HOSPITAL_COMMUNITY)
Admission: AD | Admit: 2017-09-10 | Discharge: 2017-09-10 | Disposition: A | Discharge status: Home / Self Care | Payer: Medicaid Other | Attending: Family Medicine | Admitting: Family Medicine

## 2017-09-10 ENCOUNTER — Encounter (HOSPITAL_COMMUNITY): Admission: AD | Disposition: A | Discharge status: Home / Self Care | Payer: Self-pay | Source: Home / Self Care

## 2017-09-10 ENCOUNTER — Inpatient Hospital Stay (HOSPITAL_COMMUNITY): Payer: Medicaid Other | Admitting: Certified Registered Nurse Anesthetist

## 2017-09-10 DIAGNOSIS — O00201 Right ovarian pregnancy without intrauterine pregnancy: Secondary | ICD-10-CM

## 2017-09-10 DIAGNOSIS — Z3A01 Less than 8 weeks gestation of pregnancy: Secondary | ICD-10-CM | POA: Diagnosis not present

## 2017-09-10 DIAGNOSIS — K661 Hemoperitoneum: Secondary | ICD-10-CM | POA: Diagnosis not present

## 2017-09-10 DIAGNOSIS — O00101 Right tubal pregnancy without intrauterine pregnancy: Secondary | ICD-10-CM

## 2017-09-10 DIAGNOSIS — Z6836 Body mass index (BMI) 36.0-36.9, adult: Secondary | ICD-10-CM | POA: Diagnosis not present

## 2017-09-10 DIAGNOSIS — E669 Obesity, unspecified: Secondary | ICD-10-CM | POA: Insufficient documentation

## 2017-09-10 DIAGNOSIS — F17210 Nicotine dependence, cigarettes, uncomplicated: Secondary | ICD-10-CM | POA: Insufficient documentation

## 2017-09-10 DIAGNOSIS — R102 Pelvic and perineal pain: Secondary | ICD-10-CM

## 2017-09-10 DIAGNOSIS — O009 Unspecified ectopic pregnancy without intrauterine pregnancy: Secondary | ICD-10-CM | POA: Diagnosis not present

## 2017-09-10 DIAGNOSIS — O3481 Maternal care for other abnormalities of pelvic organs, first trimester: Secondary | ICD-10-CM | POA: Diagnosis not present

## 2017-09-10 DIAGNOSIS — D649 Anemia, unspecified: Secondary | ICD-10-CM | POA: Insufficient documentation

## 2017-09-10 HISTORY — DX: Anemia, unspecified: D64.9

## 2017-09-10 HISTORY — DX: Other specified health status: Z78.9

## 2017-09-10 HISTORY — PX: DIAGNOSTIC LAPAROSCOPY WITH REMOVAL OF ECTOPIC PREGNANCY: SHX6449

## 2017-09-10 LAB — I-STAT BETA HCG BLOOD, ED (MC, WL, AP ONLY): HCG, QUANTITATIVE: 237.6 m[IU]/mL — AB (ref <5)

## 2017-09-10 LAB — URINALYSIS, ROUTINE W REFLEX MICROSCOPIC
BACTERIA UA: NONE SEEN
Bilirubin Urine: NEGATIVE
Glucose, UA: NEGATIVE mg/dL
Ketones, ur: 5 mg/dL — AB
Leukocytes, UA: NEGATIVE
NITRITE: NEGATIVE
PROTEIN: 30 mg/dL — AB
Specific Gravity, Urine: 1.031 — ABNORMAL HIGH (ref 1.005–1.030)
pH: 7 (ref 5.0–8.0)

## 2017-09-10 LAB — COMPREHENSIVE METABOLIC PANEL
ALK PHOS: 51 U/L (ref 38–126)
ALT: 27 U/L (ref 0–44)
AST: 22 U/L (ref 15–41)
Albumin: 4 g/dL (ref 3.5–5.0)
Anion gap: 9 (ref 5–15)
BILIRUBIN TOTAL: 0.5 mg/dL (ref 0.3–1.2)
BUN: 11 mg/dL (ref 6–20)
CALCIUM: 9.2 mg/dL (ref 8.9–10.3)
CO2: 24 mmol/L (ref 22–32)
CREATININE: 0.91 mg/dL (ref 0.44–1.00)
Chloride: 106 mmol/L (ref 98–111)
GFR calc Af Amer: >60 mL/min (ref >60)
Glucose, Bld: 94 mg/dL (ref 70–99)
Potassium: 3.8 mmol/L (ref 3.5–5.1)
Sodium: 139 mmol/L (ref 135–145)
TOTAL PROTEIN: 7.3 g/dL (ref 6.5–8.1)

## 2017-09-10 LAB — CBC
HEMATOCRIT: 31.9 % — AB (ref 36.0–46.0)
HEMATOCRIT: 35.5 % — AB (ref 36.0–46.0)
HEMOGLOBIN: 10.8 g/dL — AB (ref 12.0–15.0)
Hemoglobin: 11.6 g/dL — ABNORMAL LOW (ref 12.0–15.0)
MCH: 28.4 pg (ref 26.0–34.0)
MCH: 29.2 pg (ref 26.0–34.0)
MCHC: 32.7 g/dL (ref 30.0–36.0)
MCHC: 33.9 g/dL (ref 30.0–36.0)
MCV: 86.8 fL (ref 78.0–100.0)
MCV: 86.2 fL (ref 78.0–100.0)
PLATELETS: 250 10*3/uL (ref 150–400)
Platelets: 218 10*3/uL (ref 150–400)
RBC: 4.09 MIL/uL (ref 3.87–5.11)
RBC: 3.7 MIL/uL — AB (ref 3.87–5.11)
RDW: 13.9 % (ref 11.5–15.5)
RDW: 14.4 % (ref 11.5–15.5)
WBC: 10.5 10*3/uL (ref 4.0–10.5)
WBC: 9.2 10*3/uL (ref 4.0–10.5)

## 2017-09-10 LAB — TYPE AND SCREEN
ABO/RH(D): O POS
ANTIBODY SCREEN: NEGATIVE

## 2017-09-10 LAB — ABO/RH
ABO/RH(D): O POS
ABO/RH(D): O POS

## 2017-09-10 LAB — LIPASE, BLOOD: Lipase: 27 U/L (ref 11–51)

## 2017-09-10 LAB — WET PREP, GENITAL
Sperm: NONE SEEN
Trich, Wet Prep: NONE SEEN
Yeast Wet Prep HPF POC: NONE SEEN

## 2017-09-10 LAB — HCG, QUANTITATIVE, PREGNANCY: HCG, BETA CHAIN, QUANT, S: 240 m[IU]/mL — AB (ref <5)

## 2017-09-10 SURGERY — LAPAROSCOPY, WITH ECTOPIC PREGNANCY SURGICAL TREATMENT
Anesthesia: General

## 2017-09-10 MED ORDER — IBUPROFEN 600 MG PO TABS: 600.0000 mg | ORAL_TABLET | Freq: Four times a day (QID) | ORAL | 2 refills | Status: DC | PRN

## 2017-09-10 MED ORDER — OXYCODONE HCL 5 MG/5ML PO SOLN: 5.0000 mg | Freq: Once | ORAL | Status: DC | PRN

## 2017-09-10 MED ORDER — SUGAMMADEX SODIUM 200 MG/2ML IV SOLN
INTRAVENOUS | Status: DC | PRN
  Administered 2017-09-10: 175 mg via INTRAVENOUS

## 2017-09-10 MED ORDER — LIDOCAINE HCL (CARDIAC) PF 100 MG/5ML IV SOSY
PREFILLED_SYRINGE | INTRAVENOUS | Status: AC
  Filled 2017-09-10: qty 5

## 2017-09-10 MED ORDER — PROMETHAZINE HCL 25 MG/ML IJ SOLN: 6.2500 mg | INTRAMUSCULAR | Status: DC | PRN

## 2017-09-10 MED ORDER — DOCUSATE SODIUM 100 MG PO CAPS: 100.0000 mg | ORAL_CAPSULE | Freq: Two times a day (BID) | ORAL | 2 refills | Status: DC | PRN

## 2017-09-10 MED ORDER — DEXAMETHASONE SODIUM PHOSPHATE 4 MG/ML IJ SOLN
INTRAMUSCULAR | Status: AC
  Filled 2017-09-10: qty 1

## 2017-09-10 MED ORDER — MIDAZOLAM HCL 2 MG/2ML IJ SOLN
INTRAMUSCULAR | Status: DC | PRN
  Administered 2017-09-10: 2 mg via INTRAVENOUS

## 2017-09-10 MED ORDER — LIDOCAINE HCL (CARDIAC) PF 100 MG/5ML IV SOSY
PREFILLED_SYRINGE | INTRAVENOUS | Status: DC | PRN
  Administered 2017-09-10: 100 mg via INTRAVENOUS

## 2017-09-10 MED ORDER — ROCURONIUM BROMIDE 100 MG/10ML IV SOLN
INTRAVENOUS | Status: DC | PRN
  Administered 2017-09-10: 40 mg via INTRAVENOUS

## 2017-09-10 MED ORDER — LACTATED RINGERS IV SOLN
INTRAVENOUS | Status: DC
  Administered 2017-09-10 (×2): via INTRAVENOUS

## 2017-09-10 MED ORDER — PROMETHAZINE HCL 25 MG/ML IJ SOLN
12.5000 mg | Freq: Once | INTRAMUSCULAR | Status: AC
  Administered 2017-09-10: 12.5 mg via INTRAVENOUS
  Filled 2017-09-10: qty 1

## 2017-09-10 MED ORDER — SOD CITRATE-CITRIC ACID 500-334 MG/5ML PO SOLN
ORAL | Status: AC
  Administered 2017-09-10: 30 mL
  Filled 2017-09-10: qty 15

## 2017-09-10 MED ORDER — ONDANSETRON HCL 4 MG/2ML IJ SOLN
INTRAMUSCULAR | Status: DC | PRN
  Administered 2017-09-10: 4 mg via INTRAVENOUS

## 2017-09-10 MED ORDER — KETOROLAC TROMETHAMINE 30 MG/ML IJ SOLN
INTRAMUSCULAR | Status: AC
  Filled 2017-09-10: qty 1

## 2017-09-10 MED ORDER — BUPIVACAINE HCL (PF) 0.5 % IJ SOLN
INTRAMUSCULAR | Status: DC | PRN
  Administered 2017-09-10: 7 mL
  Administered 2017-09-10: 9 mL

## 2017-09-10 MED ORDER — PROPOFOL 10 MG/ML IV BOLUS
INTRAVENOUS | Status: AC
  Filled 2017-09-10: qty 20

## 2017-09-10 MED ORDER — SODIUM CHLORIDE 0.9 % IR SOLN
Status: DC | PRN
  Administered 2017-09-10: 3000 mL

## 2017-09-10 MED ORDER — ACETAMINOPHEN 10 MG/ML IV SOLN: 1000.0000 mg | Freq: Once | INTRAVENOUS | Status: DC | PRN

## 2017-09-10 MED ORDER — OXYCODONE HCL 5 MG PO TABS: 5.0000 mg | ORAL_TABLET | Freq: Once | ORAL | Status: DC | PRN

## 2017-09-10 MED ORDER — FENTANYL CITRATE (PF) 100 MCG/2ML IJ SOLN
INTRAMUSCULAR | Status: DC | PRN
  Administered 2017-09-10 (×2): 100 ug via INTRAVENOUS
  Administered 2017-09-10: 50 ug via INTRAVENOUS

## 2017-09-10 MED ORDER — HYDROMORPHONE HCL 1 MG/ML IJ SOLN: 0.2500 mg | INTRAMUSCULAR | Status: DC | PRN

## 2017-09-10 MED ORDER — OXYCODONE-ACETAMINOPHEN 5-325 MG PO TABS: 1.0000 | ORAL_TABLET | ORAL | 0 refills | Status: DC | PRN

## 2017-09-10 MED ORDER — ONDANSETRON HCL 4 MG/2ML IJ SOLN
INTRAMUSCULAR | Status: AC
  Filled 2017-09-10: qty 2

## 2017-09-10 MED ORDER — SUGAMMADEX SODIUM 200 MG/2ML IV SOLN
INTRAVENOUS | Status: AC
  Filled 2017-09-10: qty 2

## 2017-09-10 MED ORDER — DEXAMETHASONE SODIUM PHOSPHATE 10 MG/ML IJ SOLN
INTRAMUSCULAR | Status: DC | PRN
  Administered 2017-09-10: 4 mg via INTRAVENOUS

## 2017-09-10 MED ORDER — LACTATED RINGERS IV BOLUS: 1000.0000 mL | Freq: Once | INTRAVENOUS | Status: DC

## 2017-09-10 MED ORDER — SUCCINYLCHOLINE CHLORIDE 20 MG/ML IJ SOLN
INTRAMUSCULAR | Status: DC | PRN
  Administered 2017-09-10: 100 mg via INTRAVENOUS

## 2017-09-10 MED ORDER — ROCURONIUM BROMIDE 100 MG/10ML IV SOLN
INTRAVENOUS | Status: AC
  Filled 2017-09-10: qty 1

## 2017-09-10 MED ORDER — PROPOFOL 10 MG/ML IV BOLUS
INTRAVENOUS | Status: DC | PRN
  Administered 2017-09-10: 200 mg via INTRAVENOUS

## 2017-09-10 MED ORDER — SODIUM CHLORIDE 0.9 % IV BOLUS
1000.0000 mL | Freq: Once | INTRAVENOUS | Status: AC
  Administered 2017-09-10: 1000 mL via INTRAVENOUS

## 2017-09-10 MED ORDER — FENTANYL CITRATE (PF) 250 MCG/5ML IJ SOLN
INTRAMUSCULAR | Status: AC
  Filled 2017-09-10: qty 5

## 2017-09-10 MED ORDER — MORPHINE SULFATE (PF) 4 MG/ML IV SOLN
4.0000 mg | Freq: Once | INTRAVENOUS | Status: AC
  Administered 2017-09-10: 4 mg via INTRAVENOUS
  Filled 2017-09-10: qty 1

## 2017-09-10 MED ORDER — MIDAZOLAM HCL 2 MG/2ML IJ SOLN
INTRAMUSCULAR | Status: AC
  Filled 2017-09-10: qty 2

## 2017-09-10 SURGICAL SUPPLY — 27 items
CABLE HIGH FREQUENCY MONO STRZ (ELECTRODE) IMPLANT
CATH ROBINSON RED A/P 16FR (CATHETERS) ×3 IMPLANT
DERMABOND ADVANCED (GAUZE/BANDAGES/DRESSINGS) ×2
DERMABOND ADVANCED .7 DNX12 (GAUZE/BANDAGES/DRESSINGS) ×1 IMPLANT
DURAPREP 26ML APPLICATOR (WOUND CARE) ×3 IMPLANT
GLOVE BIO SURGEON STRL SZ 6.5 (GLOVE) ×2 IMPLANT
GLOVE BIO SURGEONS STRL SZ 6.5 (GLOVE) ×1
GLOVE BIOGEL PI IND STRL 7.0 (GLOVE) ×3 IMPLANT
GLOVE BIOGEL PI INDICATOR 7.0 (GLOVE) ×6
GLOVE ECLIPSE 7.0 STRL STRAW (GLOVE) ×3 IMPLANT
GOWN STRL REUS W/TWL LRG LVL3 (GOWN DISPOSABLE) ×6 IMPLANT
NS IRRIG 1000ML POUR BTL (IV SOLUTION) ×3 IMPLANT
PACK LAPAROSCOPY BASIN (CUSTOM PROCEDURE TRAY) ×3 IMPLANT
PACK TRENDGUARD 450 HYBRID PRO (MISCELLANEOUS) IMPLANT
POUCH SPECIMEN RETRIEVAL 10MM (ENDOMECHANICALS) ×6 IMPLANT
PROTECTOR NERVE ULNAR (MISCELLANEOUS) ×6 IMPLANT
SET IRRIG TUBING LAPAROSCOPIC (IRRIGATION / IRRIGATOR) IMPLANT
SHEARS HARMONIC ACE PLUS 36CM (ENDOMECHANICALS) ×3 IMPLANT
SLEEVE XCEL OPT CAN 5 100 (ENDOMECHANICALS) ×6 IMPLANT
SUT MNCRL AB 4-0 PS2 18 (SUTURE) ×3 IMPLANT
SUT VICRYL 0 UR6 27IN ABS (SUTURE) IMPLANT
TOWEL OR 17X24 6PK STRL BLUE (TOWEL DISPOSABLE) ×6 IMPLANT
TRAY FOLEY W/BAG SLVR 14FR (SET/KITS/TRAYS/PACK) IMPLANT
TRENDGUARD 450 HYBRID PRO PACK (MISCELLANEOUS)
TROCAR XCEL NON-BLD 11X100MML (ENDOMECHANICALS) ×3 IMPLANT
TROCAR XCEL NON-BLD 5MMX100MML (ENDOMECHANICALS) ×3 IMPLANT
TUBING INSUF HEATED (TUBING) ×3 IMPLANT

## 2017-09-10 NOTE — Transfer of Care (Signed)
Immediate Anesthesia Transfer of Care Note  Patient: Kristina Alvarado  Procedure(s) Performed: DIAGNOSTIC LAPAROSCOPY WITH REMOVAL OF ECTOPIC PREGNANCY RIGHT (N/A )  Patient Location: PACU  Anesthesia Type:General  Level of Consciousness: awake, alert  and oriented  Airway & Oxygen Therapy: Patient Spontanous Breathing and Patient connected to nasal cannula oxygen  Post-op Assessment: Report given to RN and Post -op Vital signs reviewed and stable  Post vital signs: Reviewed and stable  Last Vitals:  Vitals Value Taken Time  BP 127/78 09/10/2017 11:17 AM  Temp    Pulse 113 09/10/2017 11:19 AM  Resp 17 09/10/2017 11:19 AM  SpO2 100 % 09/10/2017 11:19 AM  Vitals shown include unvalidated device data.  Last Pain:  Vitals:   09/10/17 0935  TempSrc: Oral  PainSc:          Complications: No apparent anesthesia complications

## 2017-09-10 NOTE — ED Provider Notes (Signed)
[facility] EMERGENCY DEPARTMENT Provider Note   CSN: [id number] Arrival date & time: 09/10/17  0016     History   Chief Complaint Chief Complaint  Patient presents with  . Abdominal Pain    HPI Kristina Alvarado is a 27 y.o. female.  Patient presents with right-sided abdominal pain that has been intermittent for the past 2 weeks.  Pain Has become more constant over the course of today.  She had 2 episodes of vomiting yesterday and 3 today.  Emesis is nonbloody and nonbilious.  Denies any diarrhea.  States she just had some vaginal bleeding started when she came to the hospital.  Her last menstrual cycle was at the end of June.  Denies any diarrhea.  Denies any pain with urination or blood in the urine.  States she had a pregnancy test 2 weeks ago that was negative.  No previous abdominal surgeries.  The history is provided by the patient.  Abdominal Pain   Associated symptoms include nausea and vomiting. Pertinent negatives include diarrhea, dysuria, hematuria, headaches, arthralgias and myalgias.    History reviewed. No pertinent past medical history.  There are no active problems to display for this patient.   History reviewed. No pertinent surgical history.   OB History   None      Home Medications    Prior to Admission medications   Medication Sig Start Date End Date Taking? Authorizing Provider  azithromycin (ZITHROMAX Z-PAK) 250 MG tablet Take 1 tablet (250 mg total) by mouth daily. 09/19/12   Elpidio Anis, PA-C  benzonatate (TESSALON) 100 MG capsule Take 1 capsule (100 mg total) by mouth every 8 (eight) hours. 09/19/12   Elpidio Anis, PA-C  camphor-phenol (CAMPHO-PHENIQUE) 10.8-4.7 % LIQD Apply 1 application topically daily as needed (for cold sore).    [provider]  cetirizine (ZYRTEC) 10 MG tablet Take 10 mg by mouth every other day.    [provider]  diclofenac sodium (VOLTAREN) 1 % GEL Apply 2 g topically 4 (four)  times daily. 08/12/15   Joy, Shawn C, PA-C  doxycycline (VIBRAMYCIN) 100 MG capsule Take 1 capsule (100 mg total) by mouth 2 (two) times daily. 07/16/16   Kirichenko, Lemont Fillers, PA-C  ethynodiol-ethinyl estradiol (ZOVIA) 1-50 MG-MCG tablet Take 1 tablet by mouth daily.    [provider]  guaiFENesin (ROBITUSSIN) 100 MG/5ML SOLN Take 200 mg by mouth every 4 (four) hours as needed (for cough).    [provider]  ibuprofen (ADVIL,MOTRIN) 600 MG tablet Take 1 tablet (600 mg total) by mouth every 6 (six) hours as needed. 07/16/16   Jaynie Crumble, PA-C    Family History No family history on file.  Social History Social History   Tobacco Use  . Smoking status: Current Every Day Smoker    Types: Cigars  . Smokeless tobacco: Never Used  Substance Use Topics  . Alcohol use: No  . Drug use: Never     Allergies   Motrin [ibuprofen]   Review of Systems Review of Systems  Constitutional: Negative for activity change and appetite change.  HENT: Negative for congestion and rhinorrhea.   Eyes: Negative for visual disturbance.  Respiratory: Negative for cough, chest tightness and shortness of breath.   Cardiovascular: Negative for chest pain.  Gastrointestinal: Positive for abdominal pain, nausea and vomiting. Negative for diarrhea.  Genitourinary: Positive for vaginal bleeding. Negative for dysuria and hematuria.  Musculoskeletal: Negative for arthralgias, back pain and myalgias.  Skin: Negative for rash.  Neurological:  Negative for weakness and headaches.    all other systems are negative except as noted in the HPI and PMH.    Physical Exam Updated Vital Signs BP (!) 142/73 (BP Location: Left Arm)   Pulse 98   Temp 99.2 F (37.3 C) (Oral)   Resp 20   Ht 5' (1.524 m)   Wt 85.3 kg (188 lb)   LMP 07/25/2016 (Approximate)   SpO2 100%   BMI 36.72 kg/m   Physical Exam  Constitutional: She is oriented to person, place, and time. She appears well-developed and  well-nourished. No distress.  Tearful and anxious  HENT:  Head: Normocephalic and atraumatic.  Mouth/Throat: Oropharynx is clear and moist. No oropharyngeal exudate.  Eyes: Pupils are equal, round, and reactive to light. Conjunctivae and EOM are normal.  Neck: Normal range of motion. Neck supple.  No meningismus.  Cardiovascular: Normal rate, regular rhythm, normal heart sounds and intact distal pulses.  No murmur heard. Pulmonary/Chest: Effort normal and breath sounds normal. No respiratory distress.  Abdominal: Soft. There is tenderness. There is guarding. There is no rebound.  Suprapubic and right lower quadrant tenderness with voluntary guarding.  Genitourinary:  Genitourinary Comments: Chaperone present.  Normal external genitalia.  Dark blood in vaginal vault.  Cervix is closed.  There is no CMT.  There is midline uterine and right adnexal tenderness.  Musculoskeletal: Normal range of motion. She exhibits no edema or tenderness.  No CVA tenderness  Neurological: She is alert and oriented to person, place, and time. No cranial nerve deficit. She exhibits normal muscle tone. Coordination normal.  No ataxia on finger to nose bilaterally. No pronator drift. 5/5 strength throughout. CN 2-12 intact.Equal grip strength. Sensation intact.   Skin: Skin is warm.  Psychiatric: She has a normal mood and affect. Her behavior is normal.  Nursing note and vitals reviewed.    ED Treatments / Results  Labs (all labs ordered are listed, but only abnormal results are displayed) Labs Reviewed  WET PREP, GENITAL - Abnormal; Notable for the following components:      Result Value   Clue Cells Wet Prep HPF POC PRESENT (*)    WBC, Wet Prep HPF POC MANY (*)    All other components within normal limits  CBC - Abnormal; Notable for the following components:   Hemoglobin 11.6 (*)    HCT 35.5 (*)    All other components within normal limits  URINALYSIS, ROUTINE W REFLEX MICROSCOPIC - Abnormal; Notable  for the following components:   APPearance HAZY (*)    Specific Gravity, Urine 1.031 (*)    Hgb urine dipstick LARGE (*)    Ketones, ur 5 (*)    Protein, ur 30 (*)    All other components within normal limits  HCG, QUANTITATIVE, PREGNANCY - Abnormal; Notable for the following components:   hCG, Beta Chain, Quant, S 240 (*)    All other components within normal limits  I-STAT BETA HCG BLOOD, ED ([facility], [facility], [facility] ONLY) - Abnormal; Notable for the following components:   I-stat hCG, quantitative 237.6 (*)    All other components within normal limits  LIPASE, BLOOD  COMPREHENSIVE METABOLIC PANEL  ABO/RH  GC/CHLAMYDIA PROBE AMP ([place]) NOT AT [facility]    EKG None  Radiology US Ob Comp < 14 Wks  Result Date: 09/10/2017 CLINICAL DATA:  Initial evaluation for acute vaginal bleeding, right lower quadrant pain for 2 weeks. Early pregnancy. EXAM: OBSTETRIC <14 WK [place] AND TRANSVAGINAL OB US DOPPLER ULTRASOUND  OF OVARIES TECHNIQUE: Both transabdominal and transvaginal ultrasound examinations were performed for complete evaluation of the gestation as well as the maternal uterus, adnexal regions, and pelvic cul-de-sac. Transvaginal technique was performed to assess early pregnancy. Color and duplex Doppler ultrasound was utilized to evaluate blood flow to the ovaries. COMPARISON:  None. FINDINGS: Intrauterine gestational sac: None visualized. Endometrial stripe measures 25 mm in thickness. Yolk sac:  Negative. Embryo:  Negative. Cardiac Activity: N/A Heart Rate: N/A bpm Subchorionic hemorrhage:  None visualized. Maternal uterus/adnexae: Both ovaries seen within the adnexa and demonstrate appropriate arterial and venous Doppler flow. On the right, there is a somewhat ill-defined hyperechoic area measuring 3.4 x 2.9 x 3.1 cm, either exophytic from the right ovary or possibly positioned immediately adjacent to the ovary. No significant associated vascularity or internal flow. On the left, complex cystic lesion  measuring 2.0 x 1.4 x 1.8 cm positioned within the left ovary, most consistent with a corpus luteal cyst. Moderate volume free fluid present within the pelvis. Pulsed Doppler evaluation of both ovaries demonstrates normal appearing low-resistance arterial and venous waveforms. IMPRESSION: 1. Early pregnancy with no discrete IUP identified. Finding is consistent with a pregnancy of unknown anatomic location. Differential considerations include IUP to early to visualize, recent SAB, or possibly occult ectopic pregnancy. Close clinical monitoring with serial beta HCGs and close interval follow-up ultrasound recommended as clinically warranted. 2. 3.4 cm hyperechoic lesion positioned at/just adjacent to the right ovary within the right adnexa. Finding is indeterminate, with differential considerations including possible teratoma or hemorrhagic cyst with hyperechoic blood products. This does not have a typical appearance for ectopic pregnancy. A short interval follow-up ultrasound in 6-12 weeks recommended for further evaluation. 3. 2.0 cm left ovarian corpus luteal cyst. 4. No evidence for ovarian torsion. 5. Moderate volume free fluid within the pelvis. Electronically Signed   By: Rise Mu M.D.   On: 09/10/2017 05:45   US Ob Transvaginal  Result Date: 09/10/2017 CLINICAL DATA:  Initial evaluation for acute vaginal bleeding, right lower quadrant pain for 2 weeks. Early pregnancy. EXAM: OBSTETRIC <14 WK [place] AND TRANSVAGINAL OB US DOPPLER ULTRASOUND OF OVARIES TECHNIQUE: Both transabdominal and transvaginal ultrasound examinations were performed for complete evaluation of the gestation as well as the maternal uterus, adnexal regions, and pelvic cul-de-sac. Transvaginal technique was performed to assess early pregnancy. Color and duplex Doppler ultrasound was utilized to evaluate blood flow to the ovaries. COMPARISON:  None. FINDINGS: Intrauterine gestational sac: None visualized. Endometrial stripe measures  25 mm in thickness. Yolk sac:  Negative. Embryo:  Negative. Cardiac Activity: N/A Heart Rate: N/A bpm Subchorionic hemorrhage:  None visualized. Maternal uterus/adnexae: Both ovaries seen within the adnexa and demonstrate appropriate arterial and venous Doppler flow. On the right, there is a somewhat ill-defined hyperechoic area measuring 3.4 x 2.9 x 3.1 cm, either exophytic from the right ovary or possibly positioned immediately adjacent to the ovary. No significant associated vascularity or internal flow. On the left, complex cystic lesion measuring 2.0 x 1.4 x 1.8 cm positioned within the left ovary, most consistent with a corpus luteal cyst. Moderate volume free fluid present within the pelvis. Pulsed Doppler evaluation of both ovaries demonstrates normal appearing low-resistance arterial and venous waveforms. IMPRESSION: 1. Early pregnancy with no discrete IUP identified. Finding is consistent with a pregnancy of unknown anatomic location. Differential considerations include IUP to early to visualize, recent SAB, or possibly occult ectopic pregnancy. Close clinical monitoring with serial beta HCGs and close interval follow-up ultrasound recommended as  clinically warranted. 2. 3.4 cm hyperechoic lesion positioned at/just adjacent to the right ovary within the right adnexa. Finding is indeterminate, with differential considerations including possible teratoma or hemorrhagic cyst with hyperechoic blood products. This does not have a typical appearance for ectopic pregnancy. A short interval follow-up ultrasound in 6-12 weeks recommended for further evaluation. 3. 2.0 cm left ovarian corpus luteal cyst. 4. No evidence for ovarian torsion. 5. Moderate volume free fluid within the pelvis. Electronically Signed   By: Rise Mu M.D.   On: 09/10/2017 05:45   US Pelvic Doppler (torsion R/o Or Mass Arterial Flow)  Result Date: 09/10/2017 CLINICAL DATA:  Initial evaluation for acute vaginal bleeding, right  lower quadrant pain for 2 weeks. Early pregnancy. EXAM: OBSTETRIC <14 WK [place] AND TRANSVAGINAL OB US DOPPLER ULTRASOUND OF OVARIES TECHNIQUE: Both transabdominal and transvaginal ultrasound examinations were performed for complete evaluation of the gestation as well as the maternal uterus, adnexal regions, and pelvic cul-de-sac. Transvaginal technique was performed to assess early pregnancy. Color and duplex Doppler ultrasound was utilized to evaluate blood flow to the ovaries. COMPARISON:  None. FINDINGS: Intrauterine gestational sac: None visualized. Endometrial stripe measures 25 mm in thickness. Yolk sac:  Negative. Embryo:  Negative. Cardiac Activity: N/A Heart Rate: N/A bpm Subchorionic hemorrhage:  None visualized. Maternal uterus/adnexae: Both ovaries seen within the adnexa and demonstrate appropriate arterial and venous Doppler flow. On the right, there is a somewhat ill-defined hyperechoic area measuring 3.4 x 2.9 x 3.1 cm, either exophytic from the right ovary or possibly positioned immediately adjacent to the ovary. No significant associated vascularity or internal flow. On the left, complex cystic lesion measuring 2.0 x 1.4 x 1.8 cm positioned within the left ovary, most consistent with a corpus luteal cyst. Moderate volume free fluid present within the pelvis. Pulsed Doppler evaluation of both ovaries demonstrates normal appearing low-resistance arterial and venous waveforms. IMPRESSION: 1. Early pregnancy with no discrete IUP identified. Finding is consistent with a pregnancy of unknown anatomic location. Differential considerations include IUP to early to visualize, recent SAB, or possibly occult ectopic pregnancy. Close clinical monitoring with serial beta HCGs and close interval follow-up ultrasound recommended as clinically warranted. 2. 3.4 cm hyperechoic lesion positioned at/just adjacent to the right ovary within the right adnexa. Finding is indeterminate, with differential considerations  including possible teratoma or hemorrhagic cyst with hyperechoic blood products. This does not have a typical appearance for ectopic pregnancy. A short interval follow-up ultrasound in 6-12 weeks recommended for further evaluation. 3. 2.0 cm left ovarian corpus luteal cyst. 4. No evidence for ovarian torsion. 5. Moderate volume free fluid within the pelvis. Electronically Signed   By: Rise Mu M.D.   On: 09/10/2017 05:45    Procedures Procedures (including critical care time)  Medications Ordered in ED Medications  morphine 4 MG/ML injection 4 mg (4 mg Intravenous Given 09/10/17 0324)  promethazine (PHENERGAN) injection 12.5 mg (12.5 mg Intravenous Given 09/10/17 0323)     Initial Impression / Assessment and Plan / ED Course  I have reviewed the triage vital signs and the nursing notes.  Pertinent labs & imaging results that were available during my care of the patient were reviewed by me and considered in my medical decision making (see chart for details).    Intermittent right-sided abdominal pain with nausea and vomiting.  Screening pregnancy test in triage is positive.  Patient found to have new vaginal bleeding with severe right-sided pelvic and suprapubic pain. Hemoglobin and blood pressure are stable.  Ultrasound  findings discussed with Dr. Shawnie PonsPratt of OB.  There is no IUP seen.  There is a large irregularity to the right ovary that is concerning for teratoma versus cyst versus possible ectopic pregnancy.  There is free fluid in the pelvis.  Cannot rule out ectopic pregnancy at this point.    Dr. Shawnie PonsPratt accepts patient to [facility].  Patient remains hemodynamically stable.  She is Rh+.  No RhoGam indicated.  Patient updated.  Vital signs remained stable.  She is awaiting transfer to [facility]  CRITICAL CARE Performed by: Glynn OctaveANCOUR, Robt Okuda Total critical care time: 45 minutes Critical care time was exclusive of separately billable procedures and treating other patients. Critical  care was necessary to treat or prevent imminent or life-threatening deterioration. Critical care was time spent personally by me on the following activities: development of treatment plan with patient and/or surrogate as well as nursing, discussions with consultants, evaluation of patient's response to treatment, examination of patient, obtaining history from patient or surrogate, ordering and performing treatments and interventions, ordering and review of laboratory studies, ordering and review of radiographic studies, pulse oximetry and re-evaluation of patient's condition.   Final Clinical Impressions(s) / ED Diagnoses   Final diagnoses:  Pelvic pain  Right ovarian pregnancy without intrauterine pregnancy    ED Discharge Orders    None       Masha Orbach, Jeannett SeniorStephen, MD 09/10/17 [phone]

## 2017-09-10 NOTE — Anesthesia Preprocedure Evaluation (Addendum)
Anesthesia Evaluation  Patient identified by MRN, date of birth, ID band Patient awake    Reviewed: Allergy & Precautions, NPO status , Patient's Chart, lab work & pertinent test results  Airway Mallampati: II  TM Distance: >3 FB Neck ROM: Full    Dental no notable dental hx.    Pulmonary Current Smoker,    Pulmonary exam normal breath sounds clear to auscultation       Cardiovascular negative cardio ROS Normal cardiovascular exam Rhythm:Regular Rate:Normal     Neuro/Psych negative neurological ROS  negative psych ROS   GI/Hepatic negative GI ROS, Neg liver ROS,   Endo/Other  negative endocrine ROS  Renal/GU negative Renal ROS     Musculoskeletal negative musculoskeletal ROS (+)   Abdominal (+) + obese,   Peds  Hematology  (+) anemia ,   Anesthesia Other Findings Ectopic Pregnancy  Reproductive/Obstetrics                            Anesthesia Physical Anesthesia Plan  ASA: II and emergent  Anesthesia Plan: General   Post-op Pain Management:    Induction: Intravenous  PONV Risk Score and Plan: 3 and Midazolam, Dexamethasone, Ondansetron, Treatment may vary due to age or medical condition and Scopolamine patch - Pre-op  Airway Management Planned: Oral ETT  Additional Equipment:   Intra-op Plan:   Post-operative Plan: Extubation in OR  Informed Consent: I have reviewed the patients History and Physical, chart, labs and discussed the procedure including the risks, benefits and alternatives for the proposed anesthesia with the patient or authorized representative who has indicated his/her understanding and acceptance.   Dental advisory given  Plan Discussed with: CRNA  Anesthesia Plan Comments:         Anesthesia Quick Evaluation

## 2017-09-10 NOTE — [facility]U Note (Signed)
anes at bedside

## 2017-09-10 NOTE — Discharge Instructions (Signed)
DISCHARGE INSTRUCTIONS: Laparoscopy  The following instructions have been prepared to help you care for yourself upon your return home today.  Wound care:  Do not get the incision wet for the first 24 hours. The incision should be kept clean and dry.  The Band-Aids or dressings may be removed the day after surgery.  Should the incision become sore, red, and swollen after the first week, check with your doctor.  Personal hygiene:  Shower the day after your procedure.  Activity and limitations:  Do NOT drive or operate any equipment today.  Do NOT lift anything more than 15 pounds for 2-3 weeks after surgery.  Do NOT rest in bed all day.  Walking is encouraged. Walk each day, starting slowly with 5-minute walks 3 or 4 times a day. Slowly increase the length of your walks.  Walk up and down stairs slowly.  Do NOT do strenuous activities, such as golfing, playing tennis, bowling, running, biking, weight lifting, gardening, mowing, or vacuuming for 2-4 weeks. Ask your doctor when it is okay to start.  Diet: Eat a light meal as desired this evening. You may resume your usual diet tomorrow.  Return to work: This is dependent on the type of work you do. For the most part you can return to a desk job within a week of surgery. If you are more active at work, please discuss this with your doctor.  What to expect after your surgery: You may have a slight burning sensation when you urinate on the first day. You may have a very small amount of blood in the urine. Expect to have a small amount of vaginal discharge/light bleeding for 1-2 weeks. It is not unusual to have abdominal soreness and bruising for up to 2 weeks. You may be tired and need more rest for about 1 week. You may experience shoulder pain for 24-72 hours. Lying flat in bed may relieve it.  Call your doctor for any of the following:  Develop a fever of 100.4 or greater  Inability to urinate 6 hours after discharge from  hospital  Severe pain not relieved by pain medications  Persistent of heavy bleeding at incision site  Redness or swelling around incision site after a week  Increasing nausea or vomiting  Patient Signature________________________________________ Nurse Signature_________________________________________Laparoscopic Surgery - Care After Laparoscopy is a surgical procedure. It is used to diagnose and treat diseases inside the belly(abdomen). It is usually a brief, common, and relatively simple procedure. The laparoscopeis a thin, lighted, pencil-sized instrument. It is like a telescope. It is inserted into your abdomen through a small cut (incision). Your caregiver can look at the organs inside your body through this instrument.  She can see if there is anything abnormal. Laparoscopy can be done either in a hospital or outpatient clinic. You may be given a mild sedative to help you relax before the procedure. Once in the operating room, you will be given a drug to make you sleep (general anesthesia). Laparoscopy usually lasts about 1 hour. After the procedure, you will be monitored in a recovery area until you are stable and doing well. Once you are home, it may take 3 to 7 days to fully recover.   Laparoscopy has relatively few risks. Your caregiver will discuss the risks with you before the procedure. Some problems that can occur include: RISKS AND COMPLICATIONS   Allergies to medicines.  Difficulty breathing.  Bleeding.  Infection.  Damage to other surrounding structures HOME CARE INSTRUCTIONS   Infection.  Bleeding.  Damage to other organs.  Anesthetic side effects.   Need for additional procedures such as open procedures/laparotomy PROCEDURE Once you receive anesthesia, your surgeon inflates the abdomen with a harmless gas (carbon dioxide). This makes the organs easier to see. The laparoscope is inserted into the abdomen through a small incision. This allows your surgeon to  see into the abdomen. Other small instruments are also inserted into the abdomen through other small openings. Many surgeons attach a video camera to the laparoscope to enlarge the view. During a laparoscopy, the surgeon may be looking for inflammation, infection, or cancer.  The surgeon may also need to take out certain organs or take tissue samples (biopsies). The specimens are sent to a specialist in looking at cells and tissue samples (pathologist). The pathologist examines them under a microscope to help to diagnose or confirm a disease. AFTER THE PROCEDURE   The incisions are closed with stitches (sutures) and Dermabond. Because these incisions are small (usually less than 1/2 inch), there is usually minimal discomfort after the procedure. There may also be discomfort from the instrument placement incisions in the abdomen. You will be given pain medicine to ease any discomfort.  You will rest in a recovery room for 1-2 hours until you are stable and doing well.  You may have some mild discomfort in the throat. This is from the tube placed in your throat while you were sleeping.  You may experience discomfort in the shoulder area from some trapped air between the liver and diaphragm. This sensation is normal and will slowly go away on its own.  The recovery time is shortened as long as there are no complications.  You will rest in a recovery room until stable and doing well. As long as there are no complications, you may be allowed to go home. Someone will need to drive you home and be with you for at least 24 hours once home. FINDING OUT THE RESULTS You will be called with the results of the pathology and will discuss these results with  your caregiver during your postoperative appointment. Do not assume everything is normal if you have not heard from your caregiver or the medical facility. It is important for you to follow up on all of your results. HOME CARE INSTRUCTIONS   Take all  medicines as directed.  Only take over-the-counter or prescription medicines for pain, discomfort, or fever as directed by your caregiver.  Resume daily activities as directed.  Showers are preferred over baths.  You may resume sexual activities in 1 week or as directed.  Do not drive while taking narcotics. SEEK MEDICAL CARE IF:  There is increasing abdominal pain.  You feel lightheaded or faint.  You have the chills.  You have an oral temperature above 102 F (38.9 C).  There is pus-like (purulent) drainage from any of the wounds.  You are unable to pass gas or have a bowel movement.  You feel sick to your stomach (nauseous) or throw up (vomit). MAKE SURE YOU:   Understand these instructions.  Will watch your condition.  Will get help right away if you are not doing well or get worse.  ExitCare Patient Information 2013 [employer] [place].     Salpingectomy, Care After This sheet gives you information about how to care for yourself after your procedure. Your health care provider may also give you more specific instructions. If you have problems or questions, contact your health care provider. What can I expect after  the procedure? After your procedure, it is common to have:  Pain in your abdomen.  Some occasional vaginal bleeding (spotting).  Tiredness.  Follow these instructions at home: Incision care   Keep your incision area and your bandage (dressing) clean and dry.  Follow instructions from your health care provider about how to take care of your incision. Make sure you: ? Wash your hands with soap and water before you change your dressing. If soap and water are not available, use hand sanitizer. ? Change your dressing astold by your health care provider. ? Leave stitches (sutures), staples, skin glue, or adhesive strips in place. These skin closures may need to stay in place for 2 weeks or longer. If adhesive strip edges start to loosen and curl up, you  may trim the loose edges. Do not remove adhesive strips completely unless your health care provider tells you to do that.  Check your incision area every day for signs of infection. Check for: ? More redness, swelling, or pain. ? More fluid or blood. ? Warmth. ? Pus or a bad smell. Activity   Do not drive or use heavy machinery while taking prescription pain medicine.  Do not drive for 24 hours if you received a medicine to help you relax (sedative).  Rest as directed by your health care provider. Ask your health care provider what activities are safe for you. You should avoid: ? Lifting anything that is heavier than 10 lb (4.5 kg) until your health care provider approves. ? Activities that require a lot of energy.  Until your health care provider approves: ? Do not douche. ? Do not use tampons. ? Do not have sexual intercourse. General instructions  Take over-the-counter and prescription medicines only as told by your health care provider.  To prevent or treat constipation while you are taking prescription pain medicine, your health care provider may recommend that you: ? Drink enough fluid to keep your urine clear or pale yellow. ? Take over-the-counter or prescription medicines. ? Eat foods that are high in fiber, such as fresh fruits and vegetables, whole grains, and beans. ? Limit foods that are high in fat and processed sugars, such as fried and sweet foods.  Do not take baths, swim, or use a hot tub until your health care provider approves. You may take showers.  Wear compression stockings as told by your health care provider. These stockings help to prevent blood clots and reduce swelling in your legs.  Keep all follow-up visits as told by your health care provider. This is important. Contact a health care provider if:  You have: ? Pain when you urinate. ? More redness, swelling, or pain around your incision. ? More fluid or blood coming from your incision. ? Pus or  a bad smell coming from your incision. ? A fever. ? Abdominal pain that gets worse or does not get better with medicine.  Your incision feels warm to the touch.  Your incision starts to break open.  You develop a rash.  You develop nausea and vomiting.  You feel light-headed. Get help right away if:  You develop pain in your chest or leg.  You develop shortness of breath.  You faint.  You have increased vaginal bleeding. This information is not intended to replace advice given to you by your health care provider. Make sure you discuss any questions you have with your health care provider. Document Released: 05/03/2010 Document Revised: 09/26/2015 Document Reviewed: 09/27/2015 Elsevier Interactive Patient Education  2018 [employer].

## 2017-09-10 NOTE — Anesthesia Postprocedure Evaluation (Signed)
Anesthesia Post Note  Patient: Kristina Alvarado  Procedure(s) Performed: DIAGNOSTIC LAPAROSCOPY WITH REMOVAL OF ECTOPIC PREGNANCY RIGHT SALPENGECTOMY RIGHT (N/A )     Patient location during evaluation: PACU Anesthesia Type: General Level of consciousness: awake and alert Pain management: pain level controlled Vital Signs Assessment: post-procedure vital signs reviewed and stable Respiratory status: spontaneous breathing, nonlabored ventilation, respiratory function stable and patient connected to nasal cannula oxygen Cardiovascular status: blood pressure returned to baseline and stable Postop Assessment: no apparent nausea or vomiting Anesthetic complications: no    Last Vitals:  Vitals:   09/10/17 1236 09/10/17 1315  BP:  132/77  Pulse: (!) 101 87  Resp: (!) 21 18  Temp: 37.2 C   SpO2: 100% 100%    Last Pain:  Vitals:   09/10/17 1236  TempSrc: Oral  PainSc:    Pain Goal:                 Catheryn Baconyan P Ellender

## 2017-09-10 NOTE — ED Triage Notes (Signed)
Pt endorses lower R side abdominal pain for 2 weeks, pt does have appendix. Denies N/V/D.

## 2017-09-10 NOTE — MAU Note (Signed)
Saline lock flushed, good blood return, LR infusion started.  Pt being prepped for OR.  [name] explaining reasons and risks for surgery.

## 2017-09-10 NOTE — Op Note (Signed)
Sandria ManlyQutisha Bouska PROCEDURE DATE: 09/10/2017  PREOPERATIVE DIAGNOSIS: Ruptured ectopic pregnancy POSTOPERATIVE DIAGNOSIS: Ruptured right fallopian tube ectopic pregnancy PROCEDURE: Laparoscopic right salpingectomy and removal of ectopic pregnancy SURGEON:  Dr. Jaynie CollinsUgonna Brandye Inthavong ANESTHESIOLOGY TEAM: Anesthesiologist: Leonides GrillsEllender, Ryan P, MD CRNA: Junious SilkGilbert, Melinda, CRNA; Elgie CongoMalinova, Nataliya H, CRNA  INDICATIONS: 27 y.o. G1P0 here with the preoperative diagnoses as listed above.  Please refer to preoperative notes for more details. Patient was counseled regarding need for laparoscopic salpingectomy. Risks of surgery including bleeding which may require transfusion or reoperation, infection, injury to bowel or other surrounding organs, need for additional procedures including laparotomy and other postoperative/anesthesia complications were explained to patient.  Written informed consent was obtained.  FINDINGS: Moderatel amount of hemoperitoneum estimated to be about 100 ml of blood and clots.  Dilated right fallopian tube containing ectopic gestation. Small normal appearing uterus, normal left fallopian tube, right ovary and left ovary.  ANESTHESIA: General ESTIMATED BLOOD LOSS: 100 ml from hemoperitoneum SPECIMENS: Right fallopian tube containing ectopic gestation COMPLICATIONS: None immediate  PROCEDURE IN DETAIL:  The patient was taken to the operating room where general anesthesia was administered and was found to be adequate.  She was placed in the dorsal lithotomy position, and was prepped and draped in a sterile manner.  A Foley catheter was inserted into her bladder and attached to constant drainage and a uterine manipulator was then advanced into the uterus .    After an adequate timeout was performed, attention was turned to the abdomen where an umbilical incision was made with the scalpel.  The Optiview 11-mm trocar and sleeve were then advanced without difficulty with the laparoscope under  direct visualization into the abdomen.  The abdomen was then insufflated with carbon dioxide gas and adequate pneumoperitoneum was obtained.  A survey of the patient's pelvis and abdomen revealed the findings above.  Bilateral 5-mm lower quadrant ports were then placed under direct visualization.  The Nezhat suction irrigator was then used to suction the hemoperitoneum and irrigate the pelvis.  Attention was then turned to the right fallopian tube which was grasped and ligated from the underlying mesosalpinx and uterine attachment using the Harmonic instrument.  Good hemostasis was noted.  The specimen was placed in an EndoCatch bag and removed from the abdomen intact.  The abdomen was desufflated, and all instruments were removed.  The fascial incision of the 11-mm site was reapproximated with a 0 Vicryl figure-of-eight stitch; and all skin incisions were closed with 4-0 Monocryl and Dermabond. The patient tolerated the procedure well.  Sponge, lap, and needle counts were correct times two.  The patient was then taken to the recovery room awake, extubated and in stable condition.   The patient will be discharged to home as per PACU criteria.  Routine postoperative instructions given.  She was prescribed Percocet, Ibuprofen and Colace.  She will follow up in the clinic in about 3 weeks for postoperative evaluation.  Jaynie CollinsUGONNA  Jaysiah Marchetta, MD, FACOG Obstetrician & Gynecologist, [facility], [facility]

## 2017-09-10 NOTE — MAU Note (Signed)
House coverage and anes notified of OR decision

## 2017-09-10 NOTE — MAU Note (Signed)
[facility].  Pain in RLQ started 2 wks ago.  Neg preg test ~1.5wks ago.  [facility]  [facility]

## 2017-09-10 NOTE — ED Notes (Signed)
[facility] called @ [phone]-per Katie, RN-called by Marylene LandAngela

## 2017-09-10 NOTE — H&P (Signed)
Preoperative History and Physical  Kristina Alvarado is a 27 y.o. G1P0 here for surgical evaluation and possible management of suspected ectopic pregnancy.  Presented to [facility] ED early this morning with right sided abdominal pain. Was noted to be pregnant with HCG of 240, Rh+, Hgb of 11.6, but ultrasound showed possible right adnexal ectopic pregnancy and moderate free fluid in pelvis.  She was hemodynamically stable, but there was concern about possible ruptured ectopic pregnancy.  The decision was made to bring her to [facility] for surgical evaluation. Has been NPO since 1900 last night. On arrival here, patient reported minimal pain s/p morphine administration at [facility] ER. Understands need for surgical evaluation.   No significant preoperative concerns.  Proposed surgery:  Laparoscopy, possible salpingectomy and removal of ectopic pregnancy.  Past Medical History:  Diagnosis Date  . Anemia   . Medical history non-contributory    Past Surgical History:  Procedure Laterality Date  . NO PAST SURGERIES     OB History  Gravida Para Term Preterm AB Living  1            SAB TAB Ectopic Multiple Live Births               # Outcome Date GA Lbr Len/2nd Weight Sex Delivery Anes PTL Lv  1 Current           Patient denies any other pertinent gynecologic issues.   No current facility-administered medications on file prior to encounter.    Current Outpatient Medications on File Prior to Encounter  Medication Sig Dispense Refill  . azithromycin (ZITHROMAX Z-PAK) 250 MG tablet Take 1 tablet (250 mg total) by mouth daily. 4 tablet 0  . benzonatate (TESSALON) 100 MG capsule Take 1 capsule (100 mg total) by mouth every 8 (eight) hours. 21 capsule 0  . camphor-phenol (CAMPHO-PHENIQUE) 10.8-4.7 % LIQD Apply 1 application topically daily as needed (for cold sore).    . cetirizine (ZYRTEC) 10 MG tablet Take 10 mg by mouth every other day.    . diclofenac sodium (VOLTAREN) 1 % GEL Apply 2 g topically 4 (four) times  daily. 100 g 0  . doxycycline (VIBRAMYCIN) 100 MG capsule Take 1 capsule (100 mg total) by mouth 2 (two) times daily. 20 capsule 0  . ethynodiol-ethinyl estradiol (ZOVIA) 1-50 MG-MCG tablet Take 1 tablet by mouth daily.    Marland Kitchen guaiFENesin (ROBITUSSIN) 100 MG/5ML SOLN Take 200 mg by mouth every 4 (four) hours as needed (for cough).    Marland Kitchen ibuprofen (ADVIL,MOTRIN) 600 MG tablet Take 1 tablet (600 mg total) by mouth every 6 (six) hours as needed. 30 tablet 0   Allergies  Allergen Reactions  . Motrin [Ibuprofen] Hives    As child, has taken since without problems    Social History:   reports that she has been smoking cigars.  She has never used smokeless tobacco. She reports that she does not drink alcohol or use drugs.  Family History  Problem Relation Age of Onset  . Asthma Mother   . Hypertension Mother   . Diabetes Mother   . Heart disease Maternal Aunt   . Heart disease Maternal Grandmother     Review of Systems: Pertinent items noted in HPI and remainder of comprehensive ROS otherwise negative.  PHYSICAL EXAM: Blood pressure 118/68, pulse 61, temperature 98.6 F (37 C), temperature source Oral, resp. rate 16, height 5' (1.524 m), weight 188 lb (85.3 kg), last menstrual period 07/25/2016, SpO2 100 %. CONSTITUTIONAL: Well-developed, well-nourished female in  no acute distress.  HENT:  Normocephalic, atraumatic, External right and left ear normal. Oropharynx is clear and moist EYES: Conjunctivae and EOM are normal. Pupils are equal, round, and reactive to light. No scleral icterus.  NECK: Normal range of motion, supple, no masses SKIN: Skin is warm and dry. No rash noted. Not diaphoretic. No erythema. No pallor. NEUROLOGIC: Alert and oriented to person, place, and time. Normal reflexes, muscle tone coordination. No cranial nerve deficit noted. PSYCHIATRIC: Normal mood and affect. Normal behavior. Normal judgment and thought content. CARDIOVASCULAR: Normal heart rate noted, regular  rhythm RESPIRATORY: Effort and breath sounds normal, no problems with respiration noted ABDOMEN: Soft, mild diffuse tenderness, nondistended. PELVIC: Deferred MUSCULOSKELETAL: Normal range of motion. No edema and no tenderness. 2+ distal pulses.  Labs: Results for orders placed or performed during the hospital encounter of 09/10/17 (from the past 336 hour(s))  ABO/Rh   Collection Time: 09/10/17 12:16 AM  Result Value Ref Range   ABO/RH(D) O POS    No rh immune globuloin      NOT A RH IMMUNE GLOBULIN CANDIDATE, PT RH POSITIVE Performed at [facility], [address] [place], [place] [address]   Urinalysis, Routine w reflex microscopic   Collection Time: 09/10/17 12:35 AM  Result Value Ref Range   Color, Urine YELLOW YELLOW   APPearance HAZY (A) CLEAR   Specific Gravity, Urine 1.031 (H) 1.005 - 1.030   pH 7.0 5.0 - 8.0   Glucose, UA NEGATIVE NEGATIVE mg/dL   Hgb urine dipstick LARGE (A) NEGATIVE   Bilirubin Urine NEGATIVE NEGATIVE   Ketones, ur 5 (A) NEGATIVE mg/dL   Protein, ur 30 (A) NEGATIVE mg/dL   Nitrite NEGATIVE NEGATIVE   Leukocytes, UA NEGATIVE NEGATIVE   RBC / HPF 0-5 0 - 5 RBC/hpf   WBC, UA 0-5 0 - 5 WBC/hpf   Bacteria, UA NONE SEEN NONE SEEN   Squamous Epithelial / LPF 21-50 0 - 5   Mucus PRESENT   Lipase, blood   Collection Time: 09/10/17 12:40 AM  Result Value Ref Range   Lipase 27 11 - 51 U/L  Comprehensive metabolic panel   Collection Time: 09/10/17 12:40 AM  Result Value Ref Range   Sodium 139 135 - 145 mmol/L   Potassium 3.8 3.5 - 5.1 mmol/L   Chloride 106 98 - 111 mmol/L   CO2 24 22 - 32 mmol/L   Glucose, Bld 94 70 - 99 mg/dL   BUN 11 6 - 20 mg/dL   Creatinine, Ser 6.04 0.44 - 1.00 mg/dL   Calcium 9.2 8.9 - 54.0 mg/dL   Total Protein 7.3 6.5 - 8.1 g/dL   Albumin 4.0 3.5 - 5.0 g/dL   AST 22 15 - 41 U/L   ALT 27 0 - 44 U/L   Alkaline Phosphatase 51 38 - 126 U/L   Total Bilirubin 0.5 0.3 - 1.2 mg/dL   GFR calc non Af Amer >60 >60 mL/min    GFR calc Af Amer >60 >60 mL/min   Anion gap 9 5 - 15  CBC   Collection Time: 09/10/17 12:40 AM  Result Value Ref Range   WBC 10.5 4.0 - 10.5 K/uL   RBC 4.09 3.87 - 5.11 MIL/uL   Hemoglobin 11.6 (L) 12.0 - 15.0 g/dL   HCT 98.1 (L) 19.1 - 47.8 %   MCV 86.8 78.0 - 100.0 fL   MCH 28.4 26.0 - 34.0 pg   MCHC 32.7 30.0 - 36.0 g/dL   RDW  13.9 11.5 - 15.5 %   Platelets 250 150 - 400 K/uL  I-Stat beta hCG blood, ED   Collection Time: 09/10/17 12:53 AM  Result Value Ref Range   I-stat hCG, quantitative 237.6 (H) <5 mIU/mL   Comment 3          hCG, quantitative, pregnancy   Collection Time: 09/10/17  3:13 AM  Result Value Ref Range   hCG, Beta Chain, Quant, S 240 (H) <5 mIU/mL  Wet prep, genital   Collection Time: 09/10/17  5:33 AM  Result Value Ref Range   Yeast Wet Prep HPF POC NONE SEEN NONE SEEN   Trich, Wet Prep NONE SEEN NONE SEEN   Clue Cells Wet Prep HPF POC PRESENT (A) NONE SEEN   WBC, Wet Prep HPF POC MANY (A) NONE SEEN   Sperm NONE SEEN   CBC   Collection Time: 09/10/17  9:29 AM  Result Value Ref Range   WBC 9.2 4.0 - 10.5 K/uL   RBC 3.70 (L) 3.87 - 5.11 MIL/uL   Hemoglobin 10.8 (L) 12.0 - 15.0 g/dL   HCT 40.3 (L) 47.4 - 25.9 %   MCV 86.2 78.0 - 100.0 fL   MCH 29.2 26.0 - 34.0 pg   MCHC 33.9 30.0 - 36.0 g/dL   RDW 56.3 87.5 - 64.3 %   Platelets 218 150 - 400 K/uL    Imaging Studies: US Ob Comp < 14 Wks  Result Date: 09/10/2017 CLINICAL DATA:  Initial evaluation for acute vaginal bleeding, right lower quadrant pain for 2 weeks. Early pregnancy. EXAM: OBSTETRIC <14 WK [place] AND TRANSVAGINAL OB US DOPPLER ULTRASOUND OF OVARIES TECHNIQUE: Both transabdominal and transvaginal ultrasound examinations were performed for complete evaluation of the gestation as well as the maternal uterus, adnexal regions, and pelvic cul-de-sac. Transvaginal technique was performed to assess early pregnancy. Color and duplex Doppler ultrasound was utilized to evaluate blood flow to the ovaries.  COMPARISON:  None. FINDINGS: Intrauterine gestational sac: None visualized. Endometrial stripe measures 25 mm in thickness. Yolk sac:  Negative. Embryo:  Negative. Cardiac Activity: N/A Heart Rate: N/A bpm Subchorionic hemorrhage:  None visualized. Maternal uterus/adnexae: Both ovaries seen within the adnexa and demonstrate appropriate arterial and venous Doppler flow. On the right, there is a somewhat ill-defined hyperechoic area measuring 3.4 x 2.9 x 3.1 cm, either exophytic from the right ovary or possibly positioned immediately adjacent to the ovary. No significant associated vascularity or internal flow. On the left, complex cystic lesion measuring 2.0 x 1.4 x 1.8 cm positioned within the left ovary, most consistent with a corpus luteal cyst. Moderate volume free fluid present within the pelvis. Pulsed Doppler evaluation of both ovaries demonstrates normal appearing low-resistance arterial and venous waveforms. IMPRESSION: 1. Early pregnancy with no discrete IUP identified. Finding is consistent with a pregnancy of unknown anatomic location. Differential considerations include IUP to early to visualize, recent SAB, or possibly occult ectopic pregnancy. Close clinical monitoring with serial beta HCGs and close interval follow-up ultrasound recommended as clinically warranted. 2. 3.4 cm hyperechoic lesion positioned at/just adjacent to the right ovary within the right adnexa. Finding is indeterminate, with differential considerations including possible teratoma or hemorrhagic cyst with hyperechoic blood products. This does not have a typical appearance for ectopic pregnancy. A short interval follow-up ultrasound in 6-12 weeks recommended for further evaluation. 3. 2.0 cm left ovarian corpus luteal cyst. 4. No evidence for ovarian torsion. 5. Moderate volume free fluid within the pelvis. Electronically Signed   By: Sharlet Salina  Phill MyronMcClintock M.D.   On: 09/10/2017 05:45   [place] Ob Transvaginal  Result Date:  09/10/2017 CLINICAL DATA:  Initial evaluation for acute vaginal bleeding, right lower quadrant pain for 2 weeks. Early pregnancy. EXAM: OBSTETRIC <14 WK US AND TRANSVAGINAL OB US DOPPLER ULTRASOUND OF OVARIES TECHNIQUE: Both transabdominal and transvaginal ultrasound examinations were performed for complete evaluation of the gestation as well as the maternal uterus, adnexal regions, and pelvic cul-de-sac. Transvaginal technique was performed to assess early pregnancy. Color and duplex Doppler ultrasound was utilized to evaluate blood flow to the ovaries. COMPARISON:  None. FINDINGS: Intrauterine gestational sac: None visualized. Endometrial stripe measures 25 mm in thickness. Yolk sac:  Negative. Embryo:  Negative. Cardiac Activity: N/A Heart Rate: N/A bpm Subchorionic hemorrhage:  None visualized. Maternal uterus/adnexae: Both ovaries seen within the adnexa and demonstrate appropriate arterial and venous Doppler flow. On the right, there is a somewhat ill-defined hyperechoic area measuring 3.4 x 2.9 x 3.1 cm, either exophytic from the right ovary or possibly positioned immediately adjacent to the ovary. No significant associated vascularity or internal flow. On the left, complex cystic lesion measuring 2.0 x 1.4 x 1.8 cm positioned within the left ovary, most consistent with a corpus luteal cyst. Moderate volume free fluid present within the pelvis. Pulsed Doppler evaluation of both ovaries demonstrates normal appearing low-resistance arterial and venous waveforms. IMPRESSION: 1. Early pregnancy with no discrete IUP identified. Finding is consistent with a pregnancy of unknown anatomic location. Differential considerations include IUP to early to visualize, recent SAB, or possibly occult ectopic pregnancy. Close clinical monitoring with serial beta HCGs and close interval follow-up ultrasound recommended as clinically warranted. 2. 3.4 cm hyperechoic lesion positioned at/just adjacent to the right ovary within the  right adnexa. Finding is indeterminate, with differential considerations including possible teratoma or hemorrhagic cyst with hyperechoic blood products. This does not have a typical appearance for ectopic pregnancy. A short interval follow-up ultrasound in 6-12 weeks recommended for further evaluation. 3. 2.0 cm left ovarian corpus luteal cyst. 4. No evidence for ovarian torsion. 5. Moderate volume free fluid within the pelvis. Electronically Signed   By: Rise MuBenjamin  McClintock M.D.   On: 09/10/2017 05:45   [place] Pelvic Doppler (torsion R/o Or Mass Arterial Flow)  Result Date: 09/10/2017 CLINICAL DATA:  Initial evaluation for acute vaginal bleeding, right lower quadrant pain for 2 weeks. Early pregnancy. EXAM: OBSTETRIC <14 WK US AND TRANSVAGINAL OB US DOPPLER ULTRASOUND OF OVARIES TECHNIQUE: Both transabdominal and transvaginal ultrasound examinations were performed for complete evaluation of the gestation as well as the maternal uterus, adnexal regions, and pelvic cul-de-sac. Transvaginal technique was performed to assess early pregnancy. Color and duplex Doppler ultrasound was utilized to evaluate blood flow to the ovaries. COMPARISON:  None. FINDINGS: Intrauterine gestational sac: None visualized. Endometrial stripe measures 25 mm in thickness. Yolk sac:  Negative. Embryo:  Negative. Cardiac Activity: N/A Heart Rate: N/A bpm Subchorionic hemorrhage:  None visualized. Maternal uterus/adnexae: Both ovaries seen within the adnexa and demonstrate appropriate arterial and venous Doppler flow. On the right, there is a somewhat ill-defined hyperechoic area measuring 3.4 x 2.9 x 3.1 cm, either exophytic from the right ovary or possibly positioned immediately adjacent to the ovary. No significant associated vascularity or internal flow. On the left, complex cystic lesion measuring 2.0 x 1.4 x 1.8 cm positioned within the left ovary, most consistent with a corpus luteal cyst. Moderate volume free fluid present within the  pelvis. Pulsed Doppler evaluation of both ovaries demonstrates normal appearing low-resistance  arterial and venous waveforms. IMPRESSION: 1. Early pregnancy with no discrete IUP identified. Finding is consistent with a pregnancy of unknown anatomic location. Differential considerations include IUP to early to visualize, recent SAB, or possibly occult ectopic pregnancy. Close clinical monitoring with serial beta HCGs and close interval follow-up ultrasound recommended as clinically warranted. 2. 3.4 cm hyperechoic lesion positioned at/just adjacent to the right ovary within the right adnexa. Finding is indeterminate, with differential considerations including possible teratoma or hemorrhagic cyst with hyperechoic blood products. This does not have a typical appearance for ectopic pregnancy. A short interval follow-up ultrasound in 6-12 weeks recommended for further evaluation. 3. 2.0 cm left ovarian corpus luteal cyst. 4. No evidence for ovarian torsion. 5. Moderate volume free fluid within the pelvis. Electronically Signed   By: Rise Mu M.D.   On: 09/10/2017 05:45    Assessment: Patient Active Problem List   Diagnosis Date Noted  . Encounter for assessment for suspected ectopic pregnancy 09/10/2017    Plan: Patient will undergo surgical management with laparoscopy, possible salpingectomy and removal of ectopic pregnancy.   The risks of surgery were discussed in detail with the patient including but not limited to: bleeding which may require transfusion or reoperation; infection which may require antibiotics; injury to surrounding organs which may involve bowel, bladder, ureters ; need for additional procedures including laparotomy; thromboembolic phenomenon, surgical site problems and other postoperative/anesthesia complications. Likelihood of success in alleviating the patient's condition was discussed. She was counseled about likelihood of not seeing anything that can be removed and  continued concern about ectopic pregnancy; patient declines methotrexate in this case as she desires pregnancy. Understands need for close surveillance (HCG checks in office every 2 days, follow up ultrasounds etc) if there is no surgical pathology. Routine postoperative instructions will be reviewed with the patient and her family in detail after surgery.  The patient concurred with the proposed plan, giving informed written consent for the surgery.  Patient has been NPO since last night and she will remain NPO for procedure.  Anesthesia and OR aware.  Preoperative prophylactic antibiotics and SCDs ordered on call to the OR.  To OR when ready.    Jaynie Collins, MD, FACOG Obstetrician & Gynecologist, [facility] for [employer], [facility]

## 2017-09-10 NOTE — Anesthesia Procedure Notes (Signed)
Procedure Name: Intubation Date/Time: 09/10/2017 10:03 AM Performed by: Hewitt Blade, CRNA Pre-anesthesia Checklist: Patient identified, Emergency Drugs available, Suction available and Patient being monitored Patient Re-evaluated:Patient Re-evaluated prior to induction Oxygen Delivery Method: Circle system utilized Preoxygenation: Pre-oxygenation with 100% oxygen Induction Type: IV induction, Rapid sequence and Cricoid Pressure applied Laryngoscope Size: Mac and 3 Grade View: Grade I Tube type: Oral Tube size: 7.0 mm Number of attempts: 1 Airway Equipment and Method: Stylet Placement Confirmation: ETT inserted through vocal cords under direct vision,  positive ETCO2 and breath sounds checked- equal and bilateral Secured at: 21 cm Tube secured with: Tape Dental Injury: Teeth and Oropharynx as per pre-operative assessment

## 2017-09-10 NOTE — ED Notes (Signed)
[facility] called @ [phone]-per Maggie, RN-called by Marylene LandAngela

## 2017-09-10 NOTE — ED Notes (Signed)
[facility] at bedside to transport patient. 

## 2017-09-11 ENCOUNTER — Encounter (HOSPITAL_COMMUNITY): Payer: Self-pay | Admitting: Obstetrics & Gynecology

## 2017-09-11 LAB — GC/CHLAMYDIA PROBE AMP (~~LOC~~) NOT AT ARMC
Chlamydia: POSITIVE — AB
NEISSERIA GONORRHEA: NEGATIVE

## 2017-09-14 ENCOUNTER — Other Ambulatory Visit: Payer: Self-pay | Admitting: Obstetrics & Gynecology

## 2017-09-14 DIAGNOSIS — K661 Hemoperitoneum: Secondary | ICD-10-CM

## 2017-09-14 DIAGNOSIS — O00101 Right tubal pregnancy without intrauterine pregnancy: Principal | ICD-10-CM

## 2017-09-14 NOTE — Progress Notes (Signed)
09/10/17 Pathology Diagnosis Fallopian tube, ectopic pregnancy, right - HEMATOSALPINX - NO CHORIONIC VILLI IDENTIFIED Gross Received in formalin is a 6 cm in length x up to 1.2 cm in diameter fimbriated fallopian tube, with separately received 5 x 3.5 x 2.5 cm aggregate of gelatinous red brown soft tissue within the container. The serosa is smooth to hyperemic, focally roughened. Sectioning reveals dilated cut surfaces partially filled with gelatinous red brown soft tissue. Discrete villi are not seen Comment: Dr. Macon LargeAnyanwu was notified of these results on September 11, 2017.  Patient was called with these results.  During recent surgery for presumed right tubal ectopic ectopic pregancy, there was a large clot surrounding the right fallopian tube, it is likely the pregnancy was extruded into this. Also tube contained clot and blood, and was markedly dilated compared to left tube.  Patient was advised of need to come in soon for HCG check to confirm trend. She will come [facility]15/17 [facility], appointment booked with registration staff. Order placed. Will follow up results and manage accordingly.   Kristina CollinsUGONNA  [name], MD, FACOG Obstetrician & Gynecologist, [facility] [employer], [facility]

## 2017-09-15 ENCOUNTER — Other Ambulatory Visit: Payer: Medicaid Other

## 2017-09-15 DIAGNOSIS — O00101 Right tubal pregnancy without intrauterine pregnancy: Principal | ICD-10-CM

## 2017-09-15 DIAGNOSIS — K661 Hemoperitoneum: Secondary | ICD-10-CM

## 2017-09-16 LAB — BETA HCG QUANT (REF LAB): HCG QUANT: 14 m[IU]/mL

## 2017-09-21 ENCOUNTER — Telehealth: Payer: Self-pay | Admitting: *Deleted

## 2017-09-21 NOTE — Telephone Encounter (Signed)
-----   Message from Tereso NewcomerUgonna A [name], MD sent at 09/16/2017  4:55 PM EDT ----- HCG has decreased very well. Needs another draw in 1-2 weeks to ensure resolution. Please call to inform patient of results and recommendations.

## 2017-09-21 NOTE — Telephone Encounter (Signed)
I called Sandria ManlyQutisha and notified her of results and recommendations per Dr. Macon LargeAnyanwu and she agreed to bhcg on 09/29/17.

## 2017-09-28 ENCOUNTER — Other Ambulatory Visit: Payer: Self-pay

## 2017-09-28 DIAGNOSIS — O00101 Right tubal pregnancy without intrauterine pregnancy: Principal | ICD-10-CM

## 2017-09-28 DIAGNOSIS — K661 Hemoperitoneum: Secondary | ICD-10-CM

## 2017-09-29 ENCOUNTER — Other Ambulatory Visit: Payer: Medicaid Other

## 2017-09-29 ENCOUNTER — Ambulatory Visit (INDEPENDENT_AMBULATORY_CARE_PROVIDER_SITE_OTHER): Payer: Self-pay

## 2017-09-29 DIAGNOSIS — Z5189 Encounter for other specified aftercare: Secondary | ICD-10-CM

## 2017-09-29 DIAGNOSIS — O00101 Right tubal pregnancy without intrauterine pregnancy: Secondary | ICD-10-CM | POA: Diagnosis not present

## 2017-09-29 DIAGNOSIS — K661 Hemoperitoneum: Secondary | ICD-10-CM | POA: Diagnosis not present

## 2017-09-29 NOTE — Progress Notes (Signed)
I have reviewed the chart and agree with nursing staff's documentation of this patient's encounter.  Catalina AntiguaPeggy Senon Nixon, MD 09/29/2017 5:04 PM

## 2017-09-29 NOTE — Progress Notes (Signed)
Pt was seen for a wound check, no swelling, nor d/c, no redness, no odor. Advised pt to make sure nothing is rubbing against incisions. If she has any of the above to go to [facility]U.Pt verbalized understanding.

## 2017-09-30 ENCOUNTER — Telehealth: Payer: Self-pay

## 2017-09-30 LAB — BETA HCG QUANT (REF LAB): hCG Quant: <1 m[IU]/mL

## 2017-09-30 NOTE — Telephone Encounter (Addendum)
-----   Message from Catalina AntiguaPeggy Constant, MD sent at 09/30/2017  6:16 AM EDT ----- Please inform patient of complete resolution of pregnancy. She should keep her follow appointment on 8/29  Notified pt providers advisement.  Pt stated thank you with no further questions.

## 2017-10-08 ENCOUNTER — Ambulatory Visit (INDEPENDENT_AMBULATORY_CARE_PROVIDER_SITE_OTHER): Payer: Medicaid Other | Admitting: Obstetrics and Gynecology

## 2017-10-08 ENCOUNTER — Encounter: Payer: Self-pay | Admitting: Obstetrics and Gynecology

## 2017-10-08 VITALS — BP 114/71 | HR 59 | Ht 60.0 in | Wt 180.3 lb

## 2017-10-08 DIAGNOSIS — A749 Chlamydial infection, unspecified: Secondary | ICD-10-CM

## 2017-10-08 DIAGNOSIS — K661 Hemoperitoneum: Secondary | ICD-10-CM

## 2017-10-08 DIAGNOSIS — O00101 Right tubal pregnancy without intrauterine pregnancy: Secondary | ICD-10-CM

## 2017-10-08 MED ORDER — AZITHROMYCIN 500 MG PO TABS: 1000.0000 mg | ORAL_TABLET | Freq: Once | ORAL | 1 refills | Status: AC

## 2017-10-08 NOTE — Patient Instructions (Addendum)
Health Maintenance, Female Adopting a healthy lifestyle and getting preventive care can go a long way to promote health and wellness. Talk with your health care provider about what schedule of regular examinations is right for you. This is a good chance for you to check in with your provider about disease prevention and staying healthy. In between checkups, there are plenty of things you can do on your own. Experts have done a lot of research about which lifestyle changes and preventive measures are most likely to keep you healthy. Ask your health care provider for more information. Weight and diet Eat a healthy diet  Be sure to include plenty of vegetables, fruits, low-fat dairy products, and lean protein.  Do not eat a lot of foods high in solid fats, added sugars, or salt.  Get regular exercise. This is one of the most important things you can do for your health. ? Most adults should exercise for at least 150 minutes each week. The exercise should increase your heart rate and make you sweat (moderate-intensity exercise). ? Most adults should also do strengthening exercises at least twice a week. This is in addition to the moderate-intensity exercise.  Maintain a healthy weight  Body mass index (BMI) is a measurement that can be used to identify possible weight problems. It estimates body fat based on height and weight. Your health care provider can help determine your BMI and help you achieve or maintain a healthy weight.  For females 20 years of age and older: ? A BMI below 18.5 is considered underweight. ? A BMI of 18.5 to 24.9 is normal. ? A BMI of 25 to 29.9 is considered overweight. ? A BMI of 30 and above is considered obese.  Watch levels of cholesterol and blood lipids  You should start having your blood tested for lipids and cholesterol at 25 years of age, then have this test every 5 years.  You may need to have your cholesterol levels checked more often if: ? Your lipid or  cholesterol levels are high. ? You are older than 25 years of age. ? You are at high risk for heart disease.  Cancer screening Lung Cancer  Lung cancer screening is recommended for adults 55-80 years old who are at high risk for lung cancer because of a history of smoking.  A yearly low-dose CT scan of the lungs is recommended for people who: ? Currently smoke. ? Have quit within the past 15 years. ? Have at least a 30-pack-year history of smoking. A pack year is smoking an average of one pack of cigarettes a day for 1 year.  Yearly screening should continue until it has been 15 years since you quit.  Yearly screening should stop if you develop a health problem that would prevent you from having lung cancer treatment.  Breast Cancer  Practice breast self-awareness. This means understanding how your breasts normally appear and feel.  It also means doing regular breast self-exams. Let your health care provider know about any changes, no matter how small.  If you are in your 20s or 30s, you should have a clinical breast exam (CBE) by a health care provider every 1-3 years as part of a regular health exam.  If you are 40 or older, have a CBE every year. Also consider having a breast X-ray (mammogram) every year.  If you have a family history of breast cancer, talk to your health care provider about genetic screening.  If you are at high risk   for breast cancer, talk to your health care provider about having an MRI and a mammogram every year.  Breast cancer gene (BRCA) assessment is recommended for women who have family members with BRCA-related cancers. BRCA-related cancers include: ? Breast. ? Ovarian. ? Tubal. ? Peritoneal cancers.  Results of the assessment will determine the need for genetic counseling and BRCA1 and BRCA2 testing.  Cervical Cancer Your health care provider may recommend that you be screened regularly for cancer of the pelvic organs (ovaries, uterus, and  vagina). This screening involves a pelvic examination, including checking for microscopic changes to the surface of your cervix (Pap test). You may be encouraged to have this screening done every 3 years, beginning at age 22.  For women ages 56-65, health care providers may recommend pelvic exams and Pap testing every 3 years, or they may recommend the Pap and pelvic exam, combined with testing for human papilloma virus (HPV), every 5 years. Some types of HPV increase your risk of cervical cancer. Testing for HPV may also be done on women of any age with unclear Pap test results.  Other health care providers may not recommend any screening for nonpregnant women who are considered low risk for pelvic cancer and who do not have symptoms. Ask your health care provider if a screening pelvic exam is right for you.  If you have had past treatment for cervical cancer or a condition that could lead to cancer, you need Pap tests and screening for cancer for at least 20 years after your treatment. If Pap tests have been discontinued, your risk factors (such as having a new sexual partner) need to be reassessed to determine if screening should resume. Some women have medical problems that increase the chance of getting cervical cancer. In these cases, your health care provider may recommend more frequent screening and Pap tests.  Colorectal Cancer  This type of cancer can be detected and often prevented.  Routine colorectal cancer screening usually begins at 25 years of age and continues through 25 years of age.  Your health care provider may recommend screening at an earlier age if you have risk factors for colon cancer.  Your health care provider may also recommend using home test kits to check for hidden blood in the stool.  A small camera at the end of a tube can be used to examine your colon directly (sigmoidoscopy or colonoscopy). This is done to check for the earliest forms of colorectal  cancer.  Routine screening usually begins at age 33.  Direct examination of the colon should be repeated every 5-10 years through 25 years of age. However, you may need to be screened more often if early forms of precancerous polyps or small growths are found.  Skin Cancer  Check your skin from head to toe regularly.  Tell your health care provider about any new moles or changes in moles, especially if there is a change in a mole's shape or color.  Also tell your health care provider if you have a mole that is larger than the size of a pencil eraser.  Always use sunscreen. Apply sunscreen liberally and repeatedly throughout the day.  Protect yourself by wearing long sleeves, pants, a wide-brimmed hat, and sunglasses whenever you are outside.  Heart disease, diabetes, and high blood pressure  High blood pressure causes heart disease and increases the risk of stroke. High blood pressure is more likely to develop in: ? People who have blood pressure in the high end of  the normal range (130-139/85-89 mm Hg). ? People who are overweight or obese. ? People who are African American.  If you are 21-29 years of age, have your blood pressure checked every 3-5 years. If you are 3 years of age or older, have your blood pressure checked every year. You should have your blood pressure measured twice-once when you are at a hospital or clinic, and once when you are not at a hospital or clinic. Record the average of the two measurements. To check your blood pressure when you are not at a hospital or clinic, you can use: ? An automated blood pressure machine at a pharmacy. ? A home blood pressure monitor.  If you are between 17 years and 37 years old, ask your health care provider if you should take aspirin to prevent strokes.  Have regular diabetes screenings. This involves taking a blood sample to check your fasting blood sugar level. ? If you are at a normal weight and have a low risk for diabetes,  have this test once every three years after 25 years of age. ? If you are overweight and have a high risk for diabetes, consider being tested at a younger age or more often. Preventing infection Hepatitis B  If you have a higher risk for hepatitis B, you should be screened for this virus. You are considered at high risk for hepatitis B if: ? You were born in a country where hepatitis B is common. Ask your health care provider which countries are considered high risk. ? Your parents were born in a high-risk country, and you have not been immunized against hepatitis B (hepatitis B vaccine). ? You have HIV or AIDS. ? You use needles to inject street drugs. ? You live with someone who has hepatitis B. ? You have had sex with someone who has hepatitis B. ? You get hemodialysis treatment. ? You take certain medicines for conditions, including cancer, organ transplantation, and autoimmune conditions.  Hepatitis C  Blood testing is recommended for: ? Everyone born from [birth year] through 1963. ? Anyone with known risk factors for hepatitis C.  Sexually transmitted infections (STIs)  You should be screened for sexually transmitted infections (STIs) including gonorrhea and chlamydia if: ? You are sexually active and are younger than 25 years of age. ? You are older than 25 years of age and your health care provider tells you that you are at risk for this type of infection. ? Your sexual activity has changed since you were last screened and you are at an increased risk for chlamydia or gonorrhea. Ask your health care provider if you are at risk.  If you do not have HIV, but are at risk, it may be recommended that you take a prescription medicine daily to prevent HIV infection. This is called pre-exposure prophylaxis (PrEP). You are considered at risk if: ? You are sexually active and do not regularly use condoms or know the HIV status of your partner(s). ? You take drugs by injection. ? You are  sexually active with a partner who has HIV.  Talk with your health care provider about whether you are at high risk of being infected with HIV. If you choose to begin PrEP, you should first be tested for HIV. You should then be tested every 3 months for as long as you are taking PrEP. Pregnancy  If you are premenopausal and you may become pregnant, ask your health care provider about preconception counseling.  If you may become  pregnant, take 400 to 800 micrograms (mcg) of folic acid every day.  If you want to prevent pregnancy, talk to your health care provider about birth control (contraception). Osteoporosis and menopause  Osteoporosis is a disease in which the bones lose minerals and strength with aging. This can result in serious bone fractures. Your risk for osteoporosis can be identified using a bone density scan.  If you are 10 years of age or older, or if you are at risk for osteoporosis and fractures, ask your health care provider if you should be screened.  Ask your health care provider whether you should take a calcium or vitamin D supplement to lower your risk for osteoporosis.  Menopause may have certain physical symptoms and risks.  Hormone replacement therapy may reduce some of these symptoms and risks. Talk to your health care provider about whether hormone replacement therapy is right for you. Follow these instructions at home:  Schedule regular health, dental, and eye exams.  Stay current with your immunizations.  Do not use any tobacco products including cigarettes, chewing tobacco, or electronic cigarettes.  If you are pregnant, do not drink alcohol.  If you are breastfeeding, limit how much and how often you drink alcohol.  Limit alcohol intake to no more than 1 drink per day for nonpregnant women. One drink equals 12 ounces of beer, 5 ounces of wine, or 1 ounces of hard liquor.  Do not use street drugs.  Do not share needles.  Ask your health care  provider for help if you need support or information about quitting drugs.  Tell your health care provider if you often feel depressed.  Tell your health care provider if you have ever been abused or do not feel safe at home. This information is not intended to replace advice given to you by your health care provider. Make sure you discuss any questions you have with your health care provider. Document Released: 08/12/2010 Document Revised: 07/05/2015 Document Reviewed: 10/31/2014 Elsevier Interactive Patient Education  2018 [employer].  Chlamydia, Female Chlamydia is an STD (sexually transmitted disease). This is an infection that spreads through sexual contact. If it is not treated, it can cause serious problems. It must be treated with antibiotic medicine. Sometimes, you may not have symptoms (asymptomatic). When you have symptoms, they can include:  Burning when you pee (urinate).  Peeing often.  Fluid (discharge) coming from the vagina.  Redness, soreness, and swelling (inflammation) of the butt (rectum).  Bleeding or fluid coming from the butt.  Belly (abdominal) pain.  Pain during sex.  Bleeding between periods.  Itching, burning, or redness in the eyes.  Fluid coming from the eyes.  Follow these instructions at home: Medicines  Take over-the-counter and prescription medicines only as told by your doctor.  Take your antibiotic medicine as told by your doctor. Do not stop taking the antibiotic even if you start to feel better. Sexual activity  Tell sex partners about your infection. Sex partners are people you had oral, anal, or vaginal sex with within 60 days of when you started getting sick. They need treatment, too.  Do not have sex until: ? You and your sex partners have been treated. ? Your doctor says it is okay.  If you have a single dose treatment, wait 7 days before having sex. General instructions  It is up to you to get your test results. Ask your  doctor when your results will be ready.  Get a lot of rest.  Eat  healthy foods.  Drink enough fluid to keep your pee (urine) clear or pale yellow.  Keep all follow-up visits as told by your doctor. You may need tests after 3 months. Preventing chlamydia  The only way to prevent chlamydia is not to have sex. To lower your risk: ? Use latex condoms correctly. Do this every time you have sex. ? Avoid having many sex partners. ? Ask if your partner has been tested for STDs and if he or she had negative results. Contact a doctor if:  You get new symptoms.  You do not get better with treatment.  You have a fever or chills.  You have pain during sex. Get help right away if:  Your pain gets worse and does not get better with medicine.  You get flu-like symptoms, such as: ? Night sweats. ? Sore throat. ? Muscle aches.  You feel sick to your stomach (nauseous).  You throw up (vomit).  You have trouble swallowing.  You have bleeding: ? Between periods. ? After sex.  You have irregular periods.  You have belly pain that does not get better with medicine.  You have lower back pain that does not get better with medicine.  You feel weak or dizzy.  You pass out (faint).  You are pregnant and you get symptoms of chlamydia. Summary  Chlamydia is an infection that spreads through sexual contact.  Sometimes, chlamydia can cause no symptoms (asymptomatic).  Do not have sex until your doctor says it is okay.  All sex partners will have to be treated for chlamydia. This information is not intended to replace advice given to you by your health care provider. Make sure you discuss any questions you have with your health care provider. Document Released: 11/06/2007 Document Revised: 01/17/2016 Document Reviewed: 01/17/2016 Elsevier Interactive Patient Education  2017 [employer].

## 2017-10-08 NOTE — Progress Notes (Signed)
Ms Kristina Alvarado presents for post op follow up from Dx laparoscopy for ruptured ectopic on 09/10/17. BHCG < 1 on 09/29/17. Denies any bowel or bladder dysfunction. Undecided about contraception Last pap unknown.  PE AF VSS Lungs clear Heart RRR Abd soft + BS incisions well healed  A/P Post op visit        + Chlamydia Pt informed of + chlamydia. Medication sent to pharmacy. Pt advised to reframe for IC until TOC. Partner present and advised to be evaluated as well. Return to normal ADL's. Condoms for contraception. F/U in 3-4 weeks for yearly GYN exam and TOC.

## 2017-11-02 ENCOUNTER — Telehealth: Payer: Self-pay | Admitting: General Practice

## 2017-11-02 NOTE — Telephone Encounter (Signed)
Patient called and left message on nurse voicemail line stating her cycle stopped last Monday and yesterday she started bleeding again almost like a period. Called patient and confirmed this is her first period post surgery. Patient has had recent chlamydia infection but has been treated. Patient reports she has not started birth control yet. Discussed with patient the irregular bleeding is likely her body trying to get back on track after everything that has happened with the surgery & infection. Reassured patient that hopefully the bleeding will stop in a few days and we will follow up with her at 10/17 visit. Discussed she may call us back if this continues to concern her or if she has other questions. Patient verbalized understanding and had no other questions.

## 2017-11-26 ENCOUNTER — Other Ambulatory Visit: Payer: Self-pay | Admitting: *Deleted

## 2017-11-26 ENCOUNTER — Ambulatory Visit: Payer: Medicaid Other | Admitting: Obstetrics and Gynecology

## 2017-11-26 ENCOUNTER — Other Ambulatory Visit: Payer: Self-pay | Admitting: Obstetrics and Gynecology

## 2017-11-26 DIAGNOSIS — Z30011 Encounter for initial prescription of contraceptive pills: Secondary | ICD-10-CM

## 2017-11-26 MED ORDER — DESOGESTREL-ETHINYL ESTRADIOL 0.15-30 MG-MCG PO TABS: 1.0000 | ORAL_TABLET | Freq: Every day | ORAL | 11 refills | Status: DC

## 2017-12-17 ENCOUNTER — Other Ambulatory Visit (HOSPITAL_COMMUNITY)
Admission: RE | Admit: 2017-12-17 | Discharge: 2017-12-17 | Disposition: A | Discharge status: Home / Self Care | Payer: Medicaid Other | Source: Ambulatory Visit | Attending: Obstetrics and Gynecology | Admitting: Obstetrics and Gynecology

## 2017-12-17 ENCOUNTER — Encounter: Payer: Self-pay | Admitting: Obstetrics and Gynecology

## 2017-12-17 ENCOUNTER — Ambulatory Visit (INDEPENDENT_AMBULATORY_CARE_PROVIDER_SITE_OTHER): Payer: Medicaid Other | Admitting: Obstetrics and Gynecology

## 2017-12-17 VITALS — BP 113/69 | HR 64 | Ht 60.0 in | Wt 176.2 lb

## 2017-12-17 DIAGNOSIS — Z309 Encounter for contraceptive management, unspecified: Secondary | ICD-10-CM | POA: Insufficient documentation

## 2017-12-17 DIAGNOSIS — Z01419 Encounter for gynecological examination (general) (routine) without abnormal findings: Secondary | ICD-10-CM | POA: Diagnosis not present

## 2017-12-17 DIAGNOSIS — Z3041 Encounter for surveillance of contraceptive pills: Secondary | ICD-10-CM

## 2017-12-17 DIAGNOSIS — Z0000 Encounter for general adult medical examination without abnormal findings: Secondary | ICD-10-CM

## 2017-12-17 DIAGNOSIS — Z113 Encounter for screening for infections with a predominantly sexual mode of transmission: Secondary | ICD-10-CM | POA: Insufficient documentation

## 2017-12-17 DIAGNOSIS — A749 Chlamydial infection, unspecified: Secondary | ICD-10-CM

## 2017-12-17 NOTE — Patient Instructions (Signed)

## 2017-12-17 NOTE — Progress Notes (Signed)
Kristina Alvarado is a 27 y.o. [id number] female here for a routine annual gynecologic exam and TOC for chlamydia.  Has not started her OCP's yet.  Denies abnormal vaginal bleeding, discharge, pelvic pain, problems with intercourse or other gynecologic concerns.    Gynecologic History Patient's last menstrual period was 11/24/2017 (exact date). Contraception: OCP (estrogen/progesterone) Last Pap: NA. Results were: NA Last mammogram: NA. Results were: NA  Obstetric History OB History  Gravida Para Term Preterm AB Living  2       1    SAB TAB Ectopic Multiple Live Births      1        # Outcome Date GA Lbr Len/2nd Weight Sex Delivery Anes PTL Lv  2 Gravida           1 Ectopic             Past Medical History:  Diagnosis Date  . Anemia   . Medical history non-contributory     Past Surgical History:  Procedure Laterality Date  . DIAGNOSTIC LAPAROSCOPY WITH REMOVAL OF ECTOPIC PREGNANCY N/A 09/10/2017   Procedure: DIAGNOSTIC LAPAROSCOPY WITH REMOVAL OF ECTOPIC PREGNANCY RIGHT SALPENGECTOMY RIGHT;  Surgeon: Tereso Newcomer, MD;  Location: [facility];  Service: Gynecology;  Laterality: N/A;  . NO PAST SURGERIES      Current Outpatient Medications on File Prior to Visit  Medication Sig Dispense Refill  . desogestrel-ethinyl estradiol (APRI) 0.15-30 MG-MCG tablet Take 1 tablet by mouth daily. (Patient not taking: Reported on 12/17/2017) 1 Package 11   No current facility-administered medications on file prior to visit.     No Known Allergies  Social History   Socioeconomic History  . Marital status: Single    Spouse name: Not on file  . Number of children: Not on file  . Years of education: Not on file  . Highest education level: Not on file  Occupational History  . Not on file  Social Needs  . Financial resource strain: Not on file  . Food insecurity:    Worry: Not on file    Inability: Not on file  . Transportation needs:    Medical: Not on file    Non-medical: Not on  file  Tobacco Use  . Smoking status: Current Every Day Smoker    Types: Cigars  . Smokeless tobacco: Never Used  . Tobacco comment: 1/d  Substance and Sexual Activity  . Alcohol use: No  . Drug use: Never  . Sexual activity: Not Currently    Birth control/protection: None  Lifestyle  . Physical activity:    Days per week: Not on file    Minutes per session: Not on file  . Stress: Not on file  Relationships  . Social connections:    Talks on phone: Not on file    Gets together: Not on file    Attends religious service: Not on file    Active member of club or organization: Not on file    Attends meetings of clubs or organizations: Not on file    Relationship status: Not on file  . Intimate partner violence:    Fear of current or ex partner: Not on file    Emotionally abused: Not on file    Physically abused: Not on file    Forced sexual activity: Not on file  Other Topics Concern  . Not on file  Social History Narrative  . Not on file    Family History  Problem Relation Age of Onset  .  Asthma Mother   . Hypertension Mother   . Diabetes Mother   . Heart disease Maternal Aunt   . Heart disease Maternal Grandmother     The following portions of the patient's history were reviewed and updated as appropriate: allergies, current medications, past family history, past medical history, past social history, past surgical history and problem list.  Review of Systems Pertinent items noted in HPI and remainder of comprehensive ROS otherwise negative.   Objective:  BP 113/69   Pulse 64   Ht 5' (1.524 m)   Wt 176 lb 3.2 oz (79.9 kg)   LMP 11/24/2017 (Exact Date)   Breastfeeding? Unknown   BMI 34.41 kg/m  CONSTITUTIONAL: Well-developed, well-nourished female in no acute distress.  HENT:  Normocephalic, atraumatic, External right and left ear normal. Oropharynx is clear and moist EYES: Conjunctivae and EOM are normal. Pupils are equal, round, and reactive to light. No  scleral icterus.  NECK: Normal range of motion, supple, no masses.  Normal thyroid.  SKIN: Skin is warm and dry. No rash noted. Not diaphoretic. No erythema. No pallor. NEUROLGIC: Alert and oriented to person, place, and time. Normal reflexes, muscle tone coordination. No cranial nerve deficit noted. PSYCHIATRIC: Normal mood and affect. Normal behavior. Normal judgment and thought content. CARDIOVASCULAR: Normal heart rate noted, regular rhythm RESPIRATORY: Clear to auscultation bilaterally. Effort and breath sounds normal, no problems with respiration noted. BREASTS: Symmetric in size. No masses, skin changes, nipple drainage, or lymphadenopathy. ABDOMEN: Soft, normal bowel sounds, no distention noted.  No tenderness, rebound or guarding.  PELVIC: Normal appearing external genitalia; normal appearing vaginal mucosa and cervix.  No abnormal discharge noted.  Pap smear obtained.  Normal uterine size, no other palpable masses, no uterine or adnexal tenderness. MUSCULOSKELETAL: Normal range of motion. No tenderness.  No cyanosis, clubbing, or edema.  2+ distal pulses.   Assessment:  Annual gynecologic examination with pap smear Contraceptive management Plan:  Will follow up results of pap smear and manage accordingly. Pt instructed on OCP use. R/B/Back up method reviewed with pt Routine preventative health maintenance measures emphasized. Please refer to After Visit Summary for other counseling recommendations.    Hermina Staggers, MD, FACOG Attending Obstetrician & Gynecologist Center for [facility], [facility]

## 2017-12-22 LAB — CYTOLOGY - PAP
Chlamydia: NEGATIVE
Diagnosis: NEGATIVE
Neisseria Gonorrhea: NEGATIVE

## 2018-02-10 ENCOUNTER — Emergency Department (HOSPITAL_COMMUNITY): Payer: Self-pay

## 2018-02-10 ENCOUNTER — Encounter: Payer: Self-pay | Admitting: Emergency Medicine

## 2018-02-10 ENCOUNTER — Emergency Department (HOSPITAL_COMMUNITY)
Admission: EM | Admit: 2018-02-10 | Discharge: 2018-02-10 | Disposition: A | Discharge status: Home / Self Care | Payer: Self-pay | Attending: Emergency Medicine | Admitting: Emergency Medicine

## 2018-02-10 ENCOUNTER — Other Ambulatory Visit: Payer: Self-pay

## 2018-02-10 DIAGNOSIS — F17290 Nicotine dependence, other tobacco product, uncomplicated: Secondary | ICD-10-CM | POA: Insufficient documentation

## 2018-02-10 DIAGNOSIS — J111 Influenza due to unidentified influenza virus with other respiratory manifestations: Secondary | ICD-10-CM | POA: Insufficient documentation

## 2018-02-10 MED ORDER — IBUPROFEN 200 MG PO TABS
600.0000 mg | ORAL_TABLET | Freq: Once | ORAL | Status: AC
  Administered 2018-02-10: 600 mg via ORAL
  Filled 2018-02-10: qty 1

## 2018-02-10 NOTE — ED Provider Notes (Addendum)
[facility]   CSN: [id number] Arrival date & time: 02/10/18  1254     History   Chief Complaint Chief Complaint  Patient presents with  . Influenza    HPI Kristina Alvarado is a 28 y.o. female.  Patient is a 28 year old female who presents with flulike symptoms. She started having coughing and cold symptoms about 5 days ago.  She has had runny nose congestion and coughing with subjective fevers and chills.  She states over the last 2 days she has had increased myalgias and reports some chest pain which is worse with coughing.  It is in the center of her chest.  She also reports some shortness of breath today.  She was at work and sent here for further evaluation.  She has had some associated nausea and vomiting but she says she is able to keep down fluids.     Past Medical History:  Diagnosis Date  . Anemia   . Medical history non-contributory     Patient Active Problem List   Diagnosis Date Noted  . Visit for routine gyn exam 12/17/2017  . Contraception management 12/17/2017  . Chlamydia 10/08/2017    Past Surgical History:  Procedure Laterality Date  . DIAGNOSTIC LAPAROSCOPY WITH REMOVAL OF ECTOPIC PREGNANCY N/A 09/10/2017   Procedure: DIAGNOSTIC LAPAROSCOPY WITH REMOVAL OF ECTOPIC PREGNANCY RIGHT SALPENGECTOMY RIGHT;  Surgeon: Tereso NewcomerAnyanwu, Ugonna A, MD;  Location: [facility];  Service: Gynecology;  Laterality: N/A;  . NO PAST SURGERIES       OB History    Gravida  2   Para      Term      Preterm      AB  1   Living        SAB      TAB      Ectopic  1   Multiple      Live Births               Home Medications    Prior to Admission medications   Medication Sig Start Date End Date Taking? Authorizing Provider  desogestrel-ethinyl estradiol (APRI) 0.15-30 MG-MCG tablet Take 1 tablet by mouth daily. Patient not taking: Reported on 12/17/2017 11/26/17   Hermina StaggersErvin, Michael L, MD    Family  History Family History  Problem Relation Age of Onset  . Asthma Mother   . Hypertension Mother   . Diabetes Mother   . Heart disease Maternal Aunt   . Heart disease Maternal Grandmother     Social History Social History   Tobacco Use  . Smoking status: Current Every Day Smoker    Types: Cigars  . Smokeless tobacco: Never Used  . Tobacco comment: 1/d  Substance Use Topics  . Alcohol use: No  . Drug use: Never     Allergies   Patient has no known allergies.   Review of Systems Review of Systems  Constitutional: Positive for chills, fatigue and fever. Negative for diaphoresis.  HENT: Positive for congestion and rhinorrhea. Negative for sneezing.   Eyes: Negative.   Respiratory: Positive for cough and shortness of breath. Negative for chest tightness.   Cardiovascular: Positive for chest pain. Negative for leg swelling.  Gastrointestinal: Positive for nausea and vomiting. Negative for abdominal pain, blood in stool and diarrhea.  Genitourinary: Negative for difficulty urinating, flank pain, frequency and hematuria.  Musculoskeletal: Positive for myalgias. Negative for arthralgias and back pain.  Skin: Negative for rash.  Neurological: Positive for  headaches. Negative for dizziness, speech difficulty, weakness and numbness.     Physical Exam Updated Vital Signs BP 123/74   Pulse 94   Temp 98.1 F (36.7 C)   Resp (!) 24   Ht 5' (1.524 m)   Wt 79.8 kg   LMP 01/16/2018 (Exact Date)   SpO2 100%   BMI 34.37 kg/m   Physical Exam Constitutional:      Appearance: She is well-developed.  HENT:     Head: Normocephalic and atraumatic.     Right Ear: Tympanic membrane normal.     Left Ear: Tympanic membrane normal.     Mouth/Throat:     Mouth: Mucous membranes are moist.     Pharynx: No oropharyngeal exudate or posterior oropharyngeal erythema.  Eyes:     Pupils: Pupils are equal, round, and reactive to light.  Neck:     Musculoskeletal: Normal range of motion and  neck supple.  Cardiovascular:     Rate and Rhythm: Normal rate and regular rhythm.     Heart sounds: Normal heart sounds.  Pulmonary:     Effort: Pulmonary effort is normal. No respiratory distress.     Breath sounds: Normal breath sounds. No wheezing or rales.  Chest:     Chest wall: No tenderness.  Abdominal:     General: Bowel sounds are normal.     Palpations: Abdomen is soft.     Tenderness: There is no abdominal tenderness. There is no guarding or rebound.  Musculoskeletal: Normal range of motion.  Lymphadenopathy:     Cervical: No cervical adenopathy.  Skin:    General: Skin is warm and dry.     Findings: No rash.  Neurological:     Mental Status: She is alert and oriented to person, place, and time.      ED Treatments / Results  Labs (all labs ordered are listed, but only abnormal results are displayed) Labs Reviewed - No data to display  EKG None  ED ECG REPORT   Date: 02/10/2018  Rate: 78  Rhythm: normal sinus rhythm  QRS Axis: normal  Intervals: normal  ST/T Wave abnormalities: normal  Conduction Disutrbances:none  Narrative Interpretation:   Old EKG Reviewed: none available  I have personally reviewed the EKG tracing and agree with the computerized printout as noted.   Radiology Dg Chest 2 View  Result Date: 02/10/2018 CLINICAL DATA:  Cough and shortness of breath. EXAM: CHEST - 2 VIEW COMPARISON:  09/19/2012 FINDINGS: Both lungs are clear. Negative for a pneumothorax. Heart and mediastinum are within normal limits. Trachea is midline. Bone structures are unremarkable. IMPRESSION: No active cardiopulmonary disease. Electronically Signed   By: Richarda OverlieAdam  Henn M.D.   On: 02/10/2018 13:58    Procedures Procedures (including critical care time)  Medications Ordered in ED Medications  ibuprofen (ADVIL,MOTRIN) tablet 600 mg (600 mg Oral Given 02/10/18 1329)     Initial Impression / Assessment and Plan / ED Course  I have reviewed the triage vital signs and  the nursing notes.  Pertinent labs & imaging results that were available during my care of the patient were reviewed by me and considered in my medical decision making (see chart for details).     Patient is a 28 year old female who presents with flulike symptoms.  She is out of the window for Tamiflu.  Chest x-ray is clear without evidence of pneumonia.  She has no hypoxia.  No increased work of breathing.  Her lungs are clear on exam.  She  feels much better after ibuprofen.  She was discharged home in good condition.  Symptomatic care instructions were given.  Return precautions were given.  Final Clinical Impressions(s) / ED Diagnoses   Final diagnoses:  Influenza    ED Discharge Orders    None       Rolan Bucco, MD 02/10/18 1437    Rolan Bucco, MD 02/10/18 1438

## 2018-02-10 NOTE — ED Notes (Signed)
Patient transported to X-ray 

## 2018-02-10 NOTE — ED Triage Notes (Signed)
Pt arrived by POV for flu like symptoms since Friday.  Pt is tearful. Crying and making tears.

## 2018-02-10 NOTE — ED Triage Notes (Signed)
Pt here from work with c/o flu like symptoms times 24 hrs

## 2018-04-29 ENCOUNTER — Encounter (HOSPITAL_COMMUNITY): Payer: Self-pay

## 2018-04-29 ENCOUNTER — Ambulatory Visit (HOSPITAL_COMMUNITY)
Admission: EM | Admit: 2018-04-29 | Discharge: 2018-04-29 | Disposition: A | Discharge status: Home / Self Care | Payer: Self-pay | Attending: Family Medicine | Admitting: Family Medicine

## 2018-04-29 ENCOUNTER — Other Ambulatory Visit: Payer: Self-pay

## 2018-04-29 DIAGNOSIS — R220 Localized swelling, mass and lump, head: Secondary | ICD-10-CM

## 2018-04-29 DIAGNOSIS — K047 Periapical abscess without sinus: Secondary | ICD-10-CM

## 2018-04-29 MED ORDER — AMOXICILLIN-POT CLAVULANATE 875-125 MG PO TABS: 1.0000 | ORAL_TABLET | Freq: Two times a day (BID) | ORAL | 0 refills | Status: DC

## 2018-04-29 MED ORDER — TRAMADOL HCL 50 MG PO TABS: 50.0000 mg | ORAL_TABLET | Freq: Four times a day (QID) | ORAL | 0 refills | Status: DC | PRN

## 2018-04-29 MED ORDER — IBUPROFEN 800 MG PO TABS: 800.0000 mg | ORAL_TABLET | Freq: Three times a day (TID) | ORAL | 0 refills | Status: DC

## 2018-04-29 NOTE — ED Triage Notes (Signed)
Patient presents to Urgent Care with complaints of right lower jaw pain and swelling since last night. Patient states she has had problems with this area swelling in the past but the tooth has since fallen out, this was about 2 years ago. Pt denies difficulty swallowing or breathing.

## 2018-04-29 NOTE — Discharge Instructions (Addendum)
We are treating you for a dental infection Augmentin twice a day for 7 days for infection Ibuprofen 800 mg every 8 hours for mild to moderate pain Tramadol every 6 hours as needed for moderate to severe pain Dental resources handout with discharge instructions Strict return precautions given  Follow up as needed for continued or worsening symptoms

## 2018-04-29 NOTE — ED Provider Notes (Signed)
[facility]    CSN: [id number] Arrival date & time: 04/29/18  1251     History   Chief Complaint Chief Complaint  Patient presents with  . Dental Pain    HPI Kristina Alvarado is a 28 y.o. female.   Pt is a 28 year old female that presents with right lower dental pain, gingival and facial swelling. This has been present and worsening over the past 3 days. Describes the pain as throbbing and aching.  She has been taking ibuprofen without much relief. She has had some drainage from the tooth.  She denies any fever, chills, trouble swallowing, breathing or trismus. She has had issues with tooth before. She does not have a dentist.   ROS per HPI      Past Medical History:  Diagnosis Date  . Anemia   . Medical history non-contributory     Patient Active Problem List   Diagnosis Date Noted  . Visit for routine gyn exam 12/17/2017  . Contraception management 12/17/2017  . Chlamydia 10/08/2017    Past Surgical History:  Procedure Laterality Date  . DIAGNOSTIC LAPAROSCOPY WITH REMOVAL OF ECTOPIC PREGNANCY N/A 09/10/2017   Procedure: DIAGNOSTIC LAPAROSCOPY WITH REMOVAL OF ECTOPIC PREGNANCY RIGHT SALPENGECTOMY RIGHT;  Surgeon: Tereso Newcomer, MD;  Location: [facility];  Service: Gynecology;  Laterality: N/A;  . NO PAST SURGERIES      OB History    Gravida  2   Para      Term      Preterm      AB  1   Living        SAB      TAB      Ectopic  1   Multiple      Live Births               Home Medications    Prior to Admission medications   Medication Sig Start Date End Date Taking? Authorizing Provider  amoxicillin-clavulanate (AUGMENTIN) 875-125 MG tablet Take 1 tablet by mouth every 12 (twelve) hours. 04/29/18   Dahlia Byes A, NP  ibuprofen (ADVIL,MOTRIN) 800 MG tablet Take 1 tablet (800 mg total) by mouth 3 (three) times daily. 04/29/18   Dahlia Byes A, NP  traMADol (ULTRAM) 50 MG tablet Take 1 tablet (50 mg total) by mouth every 6 (six)  hours as needed. 04/29/18   Janace Aris, NP    Family History Family History  Problem Relation Age of Onset  . Asthma Mother   . Hypertension Mother   . Diabetes Mother   . Heart disease Maternal Aunt   . Heart disease Maternal Grandmother     Social History Social History   Tobacco Use  . Smoking status: Current Every Day Smoker    Types: Cigars  . Smokeless tobacco: Never Used  . Tobacco comment: 1/d  Substance Use Topics  . Alcohol use: No  . Drug use: Never     Allergies   Patient has no known allergies.   Review of Systems Review of Systems   Physical Exam Triage Vital Signs ED Triage Vitals  Enc Vitals Group     BP 04/29/18 1316 118/70     Pulse Rate 04/29/18 1316 67     Resp 04/29/18 1316 17     Temp 04/29/18 1316 98.5 F (36.9 C)     Temp Source 04/29/18 1316 Oral     SpO2 04/29/18 1316 100 %     Weight 04/29/18 1318  174 lb (78.9 kg)     Height 04/29/18 1318 5' (1.524 m)     Head Circumference --      Peak Flow --      Pain Score 04/29/18 1318 10     Pain Loc --      Pain Edu? --      Excl. in GC? --    No data found.  Updated Vital Signs BP 118/70 (BP Location: Left Arm)   Pulse 67   Temp 98.5 F (36.9 C) (Oral)   Resp 17   Ht 5' (1.524 m)   Wt 174 lb (78.9 kg)   SpO2 100%   BMI 33.98 kg/m   Visual Acuity Right Eye Distance:   Left Eye Distance:   Bilateral Distance:    Right Eye Near:   Left Eye Near:    Bilateral Near:     Physical Exam Vitals signs and nursing note reviewed.  Constitutional:      Comments: Appears in pain   HENT:     Head: Normocephalic and atraumatic.     Comments: Right sided facial swelling Right lower gingival swelling  Tender to touch.  Missing tooth and one tooth with caries.  No trismus     Nose: Nose normal.     Mouth/Throat:     Pharynx: Oropharynx is clear.   Eyes:     Conjunctiva/sclera: Conjunctivae normal.  Neck:     Musculoskeletal: Normal range of motion.  Pulmonary:      Effort: Pulmonary effort is normal.  Musculoskeletal: Normal range of motion.  Skin:    General: Skin is warm and dry.  Neurological:     Mental Status: She is alert.  Psychiatric:        Mood and Affect: Mood normal.      UC Treatments / Results  Labs (all labs ordered are listed, but only abnormal results are displayed) Labs Reviewed - No data to display  EKG None  Radiology No results found.  Procedures Procedures (including critical care time)  Medications Ordered in UC Medications - No data to display  Initial Impression / Assessment and Plan / UC Course  I have reviewed the triage vital signs and the nursing notes.  Pertinent labs & imaging results that were available during my care of the patient were reviewed by me and considered in my medical decision making (see chart for details).     Treating for dental infection Augmentin twice a day for 7 days Ibuprofen every 8 hours for mild to moderate pain Tramadol every 6 hours for moderate to severe pain Dental resources given Strict return precautions given Follow up as needed for continued or worsening symptoms   Final Clinical Impressions(s) / UC Diagnoses   Final diagnoses:  Dental abscess     Discharge Instructions     We are treating you for a dental infection Augmentin twice a day for 7 days for infection Ibuprofen 800 mg every 8 hours for mild to moderate pain Tramadol every 6 hours as needed for moderate to severe pain Dental resources handout with discharge instructions Strict return precautions given  Follow up as needed for continued or worsening symptoms      ED Prescriptions    Medication Sig Dispense Auth. Provider   amoxicillin-clavulanate (AUGMENTIN) 875-125 MG tablet Take 1 tablet by mouth every 12 (twelve) hours. 14 tablet Bronte Sabado A, NP   traMADol (ULTRAM) 50 MG tablet Take 1 tablet (50 mg total) by mouth every 6 (  six) hours as needed. 15 tablet Verle Brillhart A, NP    ibuprofen (ADVIL,MOTRIN) 800 MG tablet Take 1 tablet (800 mg total) by mouth 3 (three) times daily. 21 tablet Dahlia Byes A, NP     Controlled Substance Prescriptions [place] Controlled Substance Registry consulted? Yes, I have consulted the [place] Controlled Substances Registry for this patient, and feel the risk/benefit ratio today is favorable for proceeding with this prescription for a controlled substance.   Dahlia Byes A, NP 04/30/18 [phone]

## 2018-05-20 ENCOUNTER — Other Ambulatory Visit: Payer: Self-pay

## 2018-05-20 DIAGNOSIS — N898 Other specified noninflammatory disorders of vagina: Secondary | ICD-10-CM

## 2018-05-20 MED ORDER — TERCONAZOLE 0.8 % VA CREA: 1.0000 | TOPICAL_CREAM | Freq: Every day | VAGINAL | 0 refills | Status: DC

## 2018-05-20 NOTE — Progress Notes (Signed)
Terazol rx ordered per protocol

## 2018-07-17 ENCOUNTER — Ambulatory Visit (HOSPITAL_COMMUNITY)
Admission: EM | Admit: 2018-07-17 | Discharge: 2018-07-17 | Disposition: A | Discharge status: Home / Self Care | Payer: Medicaid Other | Attending: Family Medicine | Admitting: Family Medicine

## 2018-07-17 ENCOUNTER — Other Ambulatory Visit: Payer: Self-pay

## 2018-07-17 ENCOUNTER — Encounter (HOSPITAL_COMMUNITY): Payer: Self-pay | Admitting: Emergency Medicine

## 2018-07-17 DIAGNOSIS — Z8759 Personal history of other complications of pregnancy, childbirth and the puerperium: Secondary | ICD-10-CM | POA: Diagnosis not present

## 2018-07-17 DIAGNOSIS — Z3201 Encounter for pregnancy test, result positive: Secondary | ICD-10-CM | POA: Diagnosis not present

## 2018-07-17 LAB — POCT URINALYSIS DIP (DEVICE)
Bilirubin Urine: NEGATIVE
Glucose, UA: NEGATIVE mg/dL
Leukocytes,Ua: NEGATIVE
Nitrite: NEGATIVE
Protein, ur: NEGATIVE mg/dL
Specific Gravity, Urine: >=1.03 (ref 1.005–1.030)
Urobilinogen, UA: 0.2 mg/dL (ref 0.0–1.0)
pH: 6.5 (ref 5.0–8.0)

## 2018-07-17 LAB — POCT PREGNANCY, URINE: Preg Test, Ur: POSITIVE — AB

## 2018-07-17 NOTE — Discharge Instructions (Addendum)
Start prenatal vitamin daily. Follow up with women's.

## 2018-07-17 NOTE — ED Triage Notes (Signed)
Reports pregnancy tests at home were positive.  lmp was 4/10 and periods are irregular

## 2018-07-17 NOTE — ED Provider Notes (Signed)
[facility]    CSN: [id number] Arrival date & time: 07/17/18  1559     History   Chief Complaint Chief Complaint  Patient presents with  . Possible Pregnancy    HPI Kristina Alvarado is a 28 y.o. female with history of anemia and ectopic pregnancy presenting for concern of pregnancy.  Patient states she took home pregnancy test, was positive.  LMP 05/21/2018 sites irregular periods at baseline.  Patient unsure of how far along she may be.  Patient looking to establish care with OB/GYN.  Patient not currently taking prenatal vitamin.  Patient denies fever, chills, malaise, fatigue, chest pain, shortness of breath, cough, abdominal or pelvic pain.  Denies vaginal discomfort, discharge, bleeding.    Past Medical History:  Diagnosis Date  . Anemia   . Medical history non-contributory     Patient Active Problem List   Diagnosis Date Noted  . Visit for routine gyn exam 12/17/2017  . Contraception management 12/17/2017  . Chlamydia 10/08/2017    Past Surgical History:  Procedure Laterality Date  . DIAGNOSTIC LAPAROSCOPY WITH REMOVAL OF ECTOPIC PREGNANCY N/A 09/10/2017   Procedure: DIAGNOSTIC LAPAROSCOPY WITH REMOVAL OF ECTOPIC PREGNANCY RIGHT SALPENGECTOMY RIGHT;  Surgeon: Tereso NewcomerAnyanwu, Ugonna A, MD;  Location: [facility];  Service: Gynecology;  Laterality: N/A;  . NO PAST SURGERIES      OB History    Gravida  2   Para      Term      Preterm      AB  1   Living        SAB      TAB      Ectopic  1   Multiple      Live Births               Home Medications    Prior to Admission medications   Medication Sig Start Date End Date Taking? Authorizing Provider  ibuprofen (ADVIL,MOTRIN) 800 MG tablet Take 1 tablet (800 mg total) by mouth 3 (three) times daily. 04/29/18   Janace ArisBast, Traci A, NP    Family History Family History  Problem Relation Age of Onset  . Asthma Mother   . Hypertension Mother   . Diabetes Mother   . Heart disease Maternal Aunt   .  Heart disease Maternal Grandmother     Social History Social History   Tobacco Use  . Smoking status: Current Every Day Smoker    Types: Cigars  . Smokeless tobacco: Never Used  . Tobacco comment: 1/d  Substance Use Topics  . Alcohol use: No  . Drug use: Never     Allergies   Patient has no known allergies.   Review of Systems As per HPI   Physical Exam Triage Vital Signs ED Triage Vitals  Enc Vitals Group     BP 07/17/18 1657 106/63     Pulse Rate 07/17/18 1657 67     Resp 07/17/18 1657 18     Temp 07/17/18 1657 99.1 F (37.3 C)     Temp Source 07/17/18 1657 Oral     SpO2 07/17/18 1657 100 %     Weight --      Height --      Head Circumference --      Peak Flow --      Pain Score 07/17/18 1655 0     Pain Loc --      Pain Edu? --      Excl. in GC? --  No data found.  Updated Vital Signs BP 106/63 (BP Location: Right Arm)   Pulse 67   Temp 99.1 F (37.3 C) (Oral)   Resp 18   LMP 05/21/2018   SpO2 100%   Visual Acuity Right Eye Distance:   Left Eye Distance:   Bilateral Distance:    Right Eye Near:   Left Eye Near:    Bilateral Near:     Physical Exam Vitals signs reviewed.  Constitutional:      General: She is not in acute distress. HENT:     Head: Normocephalic and atraumatic.  Eyes:     General: No scleral icterus.    Pupils: Pupils are equal, round, and reactive to light.  Cardiovascular:     Rate and Rhythm: Normal rate.  Pulmonary:     Effort: Pulmonary effort is normal.  Skin:    Coloration: Skin is not jaundiced or pale.  Neurological:     Mental Status: She is alert and oriented to person, place, and time.      UC Treatments / Results  Labs (all labs ordered are listed, but only abnormal results are displayed) Labs Reviewed  POCT URINALYSIS DIP (DEVICE) - Abnormal; Notable for the following components:      Result Value   Ketones, ur TRACE (*)    Hgb urine dipstick TRACE (*)    All other components within normal  limits  POCT PREGNANCY, URINE - Abnormal; Notable for the following components:   Preg Test, Ur POSITIVE (*)    All other components within normal limits  POC URINE PREG, ED    EKG None  Radiology No results found.  Procedures Procedures (including critical care time)  Medications Ordered in UC Medications - No data to display  Initial Impression / Assessment and Plan / UC Course  I have reviewed the triage vital signs and the nursing notes.  Pertinent labs & imaging results that were available during my care of the patient were reviewed by me and considered in my medical decision making (see chart for details).     28 year old female with history of ectopic pregnancy presenting for positive pregnancy test.  Patient asymptomatic in office, urine pregnancy positive.  Patient denies urinary symptoms, abdominal or pelvic pain.  We will follow-up with OB/GYN begin prenatal vitamin today. Final Clinical Impressions(s) / UC Diagnoses   Final diagnoses:  Positive pregnancy test     Discharge Instructions     Start prenatal vitamin daily. Follow up with women's.    ED Prescriptions    None     Controlled Substance Prescriptions [place] Controlled Substance Registry consulted? Not Applicable   Quincy Sheehan, [place] 07/17/18 1847

## 2018-07-20 ENCOUNTER — Other Ambulatory Visit: Payer: Self-pay

## 2018-07-20 ENCOUNTER — Inpatient Hospital Stay (HOSPITAL_COMMUNITY): Payer: Medicaid Other

## 2018-07-20 ENCOUNTER — Encounter (HOSPITAL_COMMUNITY): Payer: Self-pay | Admitting: *Deleted

## 2018-07-20 ENCOUNTER — Inpatient Hospital Stay (HOSPITAL_COMMUNITY)
Admission: AD | Admit: 2018-07-20 | Discharge: 2018-07-20 | Disposition: A | Discharge status: Home / Self Care | Payer: Medicaid Other | Attending: Obstetrics & Gynecology | Admitting: Obstetrics & Gynecology

## 2018-07-20 DIAGNOSIS — Z3A08 8 weeks gestation of pregnancy: Secondary | ICD-10-CM | POA: Diagnosis not present

## 2018-07-20 DIAGNOSIS — F17290 Nicotine dependence, other tobacco product, uncomplicated: Secondary | ICD-10-CM | POA: Diagnosis not present

## 2018-07-20 DIAGNOSIS — IMO0001 Reserved for inherently not codable concepts without codable children: Secondary | ICD-10-CM

## 2018-07-20 DIAGNOSIS — O26899 Other specified pregnancy related conditions, unspecified trimester: Secondary | ICD-10-CM

## 2018-07-20 DIAGNOSIS — O418X10 Other specified disorders of amniotic fluid and membranes, first trimester, not applicable or unspecified: Secondary | ICD-10-CM

## 2018-07-20 DIAGNOSIS — O99331 Smoking (tobacco) complicating pregnancy, first trimester: Secondary | ICD-10-CM | POA: Insufficient documentation

## 2018-07-20 DIAGNOSIS — O468X1 Other antepartum hemorrhage, first trimester: Secondary | ICD-10-CM | POA: Diagnosis not present

## 2018-07-20 DIAGNOSIS — O26891 Other specified pregnancy related conditions, first trimester: Secondary | ICD-10-CM | POA: Diagnosis not present

## 2018-07-20 DIAGNOSIS — R109 Unspecified abdominal pain: Secondary | ICD-10-CM

## 2018-07-20 LAB — COMPREHENSIVE METABOLIC PANEL
ALT: 21 U/L (ref 0–44)
AST: 16 U/L (ref 15–41)
Albumin: 3.4 g/dL — ABNORMAL LOW (ref 3.5–5.0)
Alkaline Phosphatase: 41 U/L (ref 38–126)
Anion gap: 9 (ref 5–15)
BUN: 11 mg/dL (ref 6–20)
CO2: 21 mmol/L — ABNORMAL LOW (ref 22–32)
Calcium: 9 mg/dL (ref 8.9–10.3)
Chloride: 105 mmol/L (ref 98–111)
Creatinine, Ser: 0.71 mg/dL (ref 0.44–1.00)
GFR calc Af Amer: >60 mL/min (ref >60)
GFR calc non Af Amer: >60 mL/min (ref >60)
Glucose, Bld: 78 mg/dL (ref 70–99)
Potassium: 3.5 mmol/L (ref 3.5–5.1)
Sodium: 135 mmol/L (ref 135–145)
Total Bilirubin: 0.3 mg/dL (ref 0.3–1.2)
Total Protein: 6.2 g/dL — ABNORMAL LOW (ref 6.5–8.1)

## 2018-07-20 LAB — CBC
HCT: 31.3 % — ABNORMAL LOW (ref 36.0–46.0)
Hemoglobin: 10.4 g/dL — ABNORMAL LOW (ref 12.0–15.0)
MCH: 28.1 pg (ref 26.0–34.0)
MCHC: 33.2 g/dL (ref 30.0–36.0)
MCV: 84.6 fL (ref 80.0–100.0)
Platelets: 195 10*3/uL (ref 150–400)
RBC: 3.7 MIL/uL — ABNORMAL LOW (ref 3.87–5.11)
RDW: 14.2 % (ref 11.5–15.5)
WBC: 9.2 10*3/uL (ref 4.0–10.5)
nRBC: 0 % (ref 0.0–0.2)

## 2018-07-20 LAB — URINALYSIS, ROUTINE W REFLEX MICROSCOPIC
Bilirubin Urine: NEGATIVE
Glucose, UA: NEGATIVE mg/dL
Hgb urine dipstick: NEGATIVE
Ketones, ur: NEGATIVE mg/dL
Leukocytes,Ua: NEGATIVE
Nitrite: NEGATIVE
Protein, ur: NEGATIVE mg/dL
Specific Gravity, Urine: 1.032 — ABNORMAL HIGH (ref 1.005–1.030)
pH: 5 (ref 5.0–8.0)

## 2018-07-20 LAB — WET PREP, GENITAL
Clue Cells Wet Prep HPF POC: NONE SEEN
Sperm: NONE SEEN
Trich, Wet Prep: NONE SEEN

## 2018-07-20 LAB — HCG, QUANTITATIVE, PREGNANCY: hCG, Beta Chain, Quant, S: 75138 m[IU]/mL — ABNORMAL HIGH (ref <5)

## 2018-07-20 MED ORDER — DOCUSATE SODIUM 100 MG PO CAPS: 100.0000 mg | ORAL_CAPSULE | Freq: Two times a day (BID) | ORAL | 0 refills | Status: DC

## 2018-07-20 MED ORDER — PROMETHAZINE HCL 25 MG RE SUPP: RECTAL | 1 refills | Status: DC

## 2018-07-20 MED ORDER — ONDANSETRON HCL 8 MG PO TABS: 8.0000 mg | ORAL_TABLET | Freq: Three times a day (TID) | ORAL | 0 refills | Status: DC | PRN

## 2018-07-20 NOTE — Discharge Instructions (Signed)
[facility] Providers    [facility]       Phone: [phone]  [facility]   Phone: [phone]  [facility]  Phone: [phone]  [facility] [place]  Phone: [phone]  [facility] [place]  Phone: [phone]  [facility]   Phone: [phone]  [facility]       Phone: [phone]  [facility]    Phone: [phone]   [facility]    Phone: [phone]  [facility]    Phone: [phone]  [facility]    Phone: [phone]  [facility]       Phone: [phone]   [facility]  Phone: [phone]  [facility]    Phone: [phone]  [facility]    Phone: [phone]  [facility]      Phone: [phone]  [facility]    Phone: [phone]   

## 2018-07-20 NOTE — MAU Provider Note (Addendum)
Patient Kristina Alvarado is a 28 y.o. G2P0010 At 5220w4d by certain LMP here with complaints of abdominal pain that started this morning. She denies abnormal discharge, vaginal bleeding, constipation, dysuria. She endorses NV every morning.   She is concerned because she has a history of ruptured  ectopic pregnancy with right salpingectomy in August, 2019.   History     CSN: [id number]  Arrival date and time: 07/20/18 1619   None     Chief Complaint  Patient presents with  . Abdominal Pain   Abdominal Pain  This is a new problem. The current episode started today. The onset quality is sudden. The problem occurs intermittently. The problem has been unchanged. The pain is located in the suprapubic region. The pain is at a severity of 7/10. The abdominal pain does not radiate. Associated symptoms include nausea and vomiting. Pertinent negatives include no constipation, diarrhea or fever. Exacerbated by: standing. The pain is relieved by being still.    OB History    Gravida  2   Para      Term      Preterm      AB  1   Living        SAB      TAB      Ectopic  1   Multiple      Live Births              Past Medical History:  Diagnosis Date  . Anemia   . Medical history non-contributory     Past Surgical History:  Procedure Laterality Date  . DIAGNOSTIC LAPAROSCOPY WITH REMOVAL OF ECTOPIC PREGNANCY N/A 09/10/2017   Procedure: DIAGNOSTIC LAPAROSCOPY WITH REMOVAL OF ECTOPIC PREGNANCY RIGHT SALPENGECTOMY RIGHT;  Surgeon: Tereso NewcomerAnyanwu, Ugonna A, MD;  Location: [facility];  Service: Gynecology;  Laterality: N/A;  . NO PAST SURGERIES      Family History  Problem Relation Age of Onset  . Asthma Mother   . Hypertension Mother   . Diabetes Mother   . Heart disease Maternal Aunt   . Heart disease Maternal Grandmother     Social History   Tobacco Use  . Smoking status: Current Every Day Smoker    Types: Cigars  . Smokeless tobacco: Never Used  . Tobacco comment: 1/d   Substance Use Topics  . Alcohol use: No  . Drug use: Never    Allergies: No Known Allergies  Medications Prior to Admission  Medication Sig Dispense Refill Last Dose  . Prenatal Vit-Fe Fumarate-FA (PRENATAL MULTIVITAMIN) TABS tablet Take 1 tablet by mouth daily at 12 noon.     Marland Kitchen. ibuprofen (ADVIL,MOTRIN) 800 MG tablet Take 1 tablet (800 mg total) by mouth 3 (three) times daily. 21 tablet 0     Review of Systems  Constitutional: Negative for fever.  Gastrointestinal: Positive for abdominal pain, nausea and vomiting. Negative for constipation and diarrhea.  Genitourinary: Negative for decreased urine volume, vaginal bleeding, vaginal discharge and vaginal pain.  Neurological: Negative.   Psychiatric/Behavioral: Negative.    Physical Exam   Blood pressure 125/64, pulse 71, temperature 98.6 F (37 C), temperature source Oral, resp. rate 18, height 5' (1.524 m), weight 83.3 kg, last menstrual period 05/21/2018, SpO2 100 %, unknown if currently breastfeeding.  Physical Exam  Constitutional: She appears well-developed.  HENT:  Head: Normocephalic.  Eyes: Pupils are equal, round, and reactive to light.  Neck: Normal range of motion.  Respiratory: Effort normal.  GI: Soft.  Genitourinary:    Genitourinary  Comments: NEFG; cervix is soft, non-tender, slight tenderness on bimanual but no masses in adnexa palpated.    Musculoskeletal: Normal range of motion.  Neurological: She is alert.  Skin: Skin is warm and dry.    MAU Course  Procedures  MDM -ectopic work up performed, included beta HCG, CBC, CMP, [place].  -US shows living IUP at 6 weeks 5 days, 129 bpm.  -UA negative for ketones, discharged home with CMP pending  -CBC normal  Assessment and Plan   1. Abdominal pain during intrauterine pregnancy   2. Abdominal pain   3. Subchorionic hemorrhage of placenta in first trimester, single or unspecified fetus     -Reassured patient of living [place] intake appt on  6-10 -Explained how to take anti-nausea medicine and importance of maintaining her regimen as well as need for docusate sodium for constipation -Explained [facility] [place] and warning signs of miscarriage vs. typical bleeding from [facility] -Reviewed warning signs and when to return to MAU, patient verbalized understanding.   Mervyn Skeeters Irvin Lizama 07/20/2018, 5:24 PM

## 2018-07-20 NOTE — MAU Note (Signed)
+  HPT last wk. Sat she went to [facility] just for a preg test. Has been having morning sickness, hasn't thrown up since this morning. Started having pain in lower abd around noon. Emailed her dr, has e appt tomorrow.

## 2018-07-21 ENCOUNTER — Telehealth (INDEPENDENT_AMBULATORY_CARE_PROVIDER_SITE_OTHER): Payer: Self-pay | Admitting: *Deleted

## 2018-07-21 ENCOUNTER — Encounter: Payer: Self-pay | Admitting: *Deleted

## 2018-07-21 ENCOUNTER — Other Ambulatory Visit: Payer: Self-pay

## 2018-07-21 ENCOUNTER — Telehealth: Payer: Self-pay | Admitting: Obstetrics & Gynecology

## 2018-07-21 DIAGNOSIS — Z3490 Encounter for supervision of normal pregnancy, unspecified, unspecified trimester: Secondary | ICD-10-CM

## 2018-07-21 DIAGNOSIS — O0910 Supervision of pregnancy with history of ectopic or molar pregnancy, unspecified trimester: Secondary | ICD-10-CM | POA: Insufficient documentation

## 2018-07-21 DIAGNOSIS — O0911 Supervision of pregnancy with history of ectopic or molar pregnancy, first trimester: Secondary | ICD-10-CM

## 2018-07-21 NOTE — Progress Notes (Signed)
I connected with  Kristina Alvarado on 07/21/18 at  3:15 PM EDT by telephone and verified that I am speaking with the correct person using two identifiers.   I discussed the limitations, risks, security and privacy concerns of performing an evaluation and management service by telephone and the availability of in person appointments. I also discussed with the patient that there may be a patient responsible charge related to this service. The patient expressed understanding and agreed to proceed.  New Ob intake completed via telephone. Pt was unable to connect to MyChart video due to poor signal. She was advised that her prenatal care will consist of a combination of virtual visits as well as face to face visits in office. Pt states she conceived while taking OCP's. She reports having morning sickness daily upon waking up. She currently does not have insurance and was unable to afford Zofran as previously prescribed. I advised she may try OTC Unisom. Pt requests to be tested for diabetes due to family history (mother). I advised that she will be and this will be discussed further @ initial visit on 7/6. [id number] visitation policy discussed. Pt agrees to check BP once weekly and enter results to the [facility] App. She has access to a BP cuff from her mother and plans to use this. Pt voiced understanding of all information and instructions given.   Kristina Alvarado, Ronnell Freshwater, RN 07/21/2018  4:33 PM

## 2018-07-21 NOTE — Telephone Encounter (Signed)
Spoke with patient about her appointment on 6/10 @ 3:15 to confirm that it is a mychart visit and that she has the app downloaded on the device that she will be using for the appointment. Patient verbalized that she had the app downloaded and has had no issues using it. Patient was not screened for covid questions since it is a virtual visit.

## 2018-07-22 LAB — GC/CHLAMYDIA PROBE AMP (~~LOC~~) NOT AT ARMC
Chlamydia: NEGATIVE
Neisseria Gonorrhea: NEGATIVE

## 2018-07-26 NOTE — Progress Notes (Signed)
I have reviewed this chart and agree with the RN/CMA assessment and management.    Jakera Beaupre C Isidore Margraf, MD, FACOG Attending Physician, [facility]  

## 2018-08-16 ENCOUNTER — Other Ambulatory Visit: Payer: Self-pay

## 2018-08-16 ENCOUNTER — Ambulatory Visit (INDEPENDENT_AMBULATORY_CARE_PROVIDER_SITE_OTHER): Payer: Medicaid Other | Admitting: Obstetrics & Gynecology

## 2018-08-16 VITALS — BP 107/56 | HR 66 | Wt 183.6 lb

## 2018-08-16 DIAGNOSIS — O99211 Obesity complicating pregnancy, first trimester: Secondary | ICD-10-CM

## 2018-08-16 DIAGNOSIS — Z3490 Encounter for supervision of normal pregnancy, unspecified, unspecified trimester: Secondary | ICD-10-CM

## 2018-08-16 DIAGNOSIS — O99210 Obesity complicating pregnancy, unspecified trimester: Secondary | ICD-10-CM | POA: Diagnosis not present

## 2018-08-16 DIAGNOSIS — Z3A10 10 weeks gestation of pregnancy: Secondary | ICD-10-CM

## 2018-08-16 MED ORDER — AMBULATORY NON FORMULARY MEDICATION: 1.0000 | 0 refills | Status: DC

## 2018-08-16 NOTE — Progress Notes (Signed)
  Subjective:    Kristina Alvarado is being seen today for her first obstetrical visit. G2P)ectopic1 (only has left tube in situ)  This is not a planned pregnancy. She was on OCPs when she conceived. She is at [redacted]w[redacted]d gestation. Her obstetrical history is significant for obesity. Relationship with FOB: significant other, not living together. Patient does intend to breast feed. Pregnancy history fully reviewed.  Patient reports no complaints.  Review of Systems:   Review of Systems  She works at [employer].  Objective:     BP (!) 107/56   Pulse 66   Wt 183 lb 9.6 oz (83.3 kg)   LMP 05/21/2018 (Exact Date)   BMI 35.86 kg/m  Physical Exam  Exam  Breathing, conversing, and ambulating normally Well nourished, well hydrated Black female, no apparent distress Heart-rrr Lungs- CTAB Abd-benign    Assessment:    Pregnancy: G2P0010 Patient Active Problem List   Diagnosis Date Noted  . Supervision of low-risk pregnancy 07/21/2018  . Pregnancy with history of ectopic pregnancy 07/21/2018  . Visit for routine gyn exam 12/17/2017  . Contraception management 12/17/2017  . Chlamydia 10/08/2017       Plan:     Initial labs drawn. Prenatal vitamins. Problem list reviewed and updated. NIPS ordered Role of ultrasound in pregnancy discussed; fetal survey: ordered. Amniocentesis discussed: not indicated. Follow up in 4 weeks, virtual Check hba1c, TSH, CMP, pr/cr Rec less than 20 pounds weight gain Already on baby scripts and My Chart   Emily Filbert 08/16/2018

## 2018-08-16 NOTE — Patient Instructions (Signed)
AREA PEDIATRIC/FAMILY PRACTICE PHYSICIANS  Central/Southeast [place] [phone]) . [facility] Davy Pique, MD; Gwendlyn Deutscher, MD; Walker Kehr, MD; Andria Frames, MD; McDiarmid, MD; Dutch Quint, MD; Nori Riis, MD; Mingo Amber, [place]., [place], [place] [address] o [phone] o Mon-Fri 8:30-12:30, 1:30-5:00 o Providers come to see babies at [facility] o Accepting Medicaid . [place] [facility] [place] providers who accept newborns: Dorthy Cooler, MD; Orland Mustard, MD; Stephanie Acre, MD o [place], [place], [place] [address] o [phone] o Mon-Fri 8:00-5:30 o Babies seen by providers at [facility] o Does NOT accept Medicaid o Please call early in hospitalization for appointment (limited availability)  . Mustard [place]. John, MD o [address], [place], [place] [address] o [phone] o Mon, Tue, Thur, Fri 8:30-5:00, Wed 10:00-7:00 (closed 1-2pm) o Babies seen by [facility] providers o Accepting Medicaid . [place], MD o [place], [place], [place] [address] o [phone] o Mon-Fri 8:30-5:00, Sat 8:30-12:00 o Provider comes to see babies at [place] Medicaid o Must have been referred from current patients or contacted office prior to delivery . [place] [facility] ([place] for [facility]) Franne Forts, MD; Tamera Punt, MD; Doneen Poisson, MD; Fatima Sanger, MD; Wynetta Emery, MD; Jess Barters, MD; Tami Ribas, MD; Herbert Moors, MD; Derrell Lolling, MD; Dorothyann Peng, MD; Lucious Groves, NP; Baldo Ash, NP o [address], [place], [place] [address] o [phone] o Mon, Tue, Thur, Fri 8:30-5:30, Wed 9:30-5:30, Sat 8:30-12:30 o Babies seen by [facility] providers o Accepting Medicaid o Only accepting infants of first-time parents or siblings of current patients [facility] discharge coordinator will make follow-up appointment . Baltazar Najjar o [place] [address],  [place], [place]  [address] o [phone]   Fax - [phone] . [facility] o [address], [place], [place]  [address] o Phone - [phone]   Fax [phone] . [place], [place], [place], [place]  [address] o [phone]  East/Northeast [place] [phone]) . [facility] Reginal Lutes, MD; Jacklynn Ganong, MD; Torrie Mayers, MD; MD; Rosana Hoes, MD; Servando Salina, MD; Rose Fillers, MD; Rex Kras, MD; Corinna Capra, MD; Volney American, MD; Trilby Drummer, MD; Janann Colonel, MD; Jimmye [place], [place], [place], [place] [address] o [phone] o Mon-Fri 8:30-5:00 (extended evenings Mon-Thur as needed), Sat-Sun 10:00-1:00 o Providers come to see babies at [place] Medicaid for families of first-time babies and families with all children in the household age 72 and under. Must register with office prior to making appointment (M-F only). . [place], NP; Tomi Bamberger, MD; Redmond School, MD; [place], [place] [address] [place], [place] [address] o [phone] o Mon-Fri 8:00-5:00 o Babies seen by providers at [facility] o Does NOT accept Medicaid/Commercial Insurance Only . [facility])  Marnee Guarneri, MD; Drema Dallas, MD; Montine Circle, MD; Vilma Prader, MD; Vanita Panda, MD; Alfonso Ramus, MD; Ruthann Cancer, MD; Roxanne Mins, MD; Rosalva Ferron, MD; Polly Cobia, MD o [address] [place], [place] [address] o [phone] o Mon-Fri 8:30-5:30, Sat (Oct.-Mar.) 9:00-1:00 o Babies seen by providers at [place] [phone]) . [facility] Peers, MD; Suzan Slick, MD o [address], [place], [place] [address] o [phone] o Mon-Fri 8:30-5:00, Sat 8:30-12:00 o Providers come to see babies at [facility] o Does NOT accept Medicaid . [facility] Ricci Barker, PA; Mannie Stabile, MD; [place], [place]; Nancy Fetter, MD; Moreen Fowler, [place],  [place], [place] [address] o [phone] o Mon-Fri 8:00-5:00 o Babies seen by providers at [facility] o Does NOT accept Medicaid o Only accepting babies of parents who are patients o Please call early in hospitalization for appointment (limited availability) . [facility] Pediatricians Blanca Friend, MD; Sharlene Motts, MD; Rod Can, MD; Warner Mccreedy, NP; Sabra Heck, MD; Ermalinda Memos, MD; Sharlett Iles, NP; Aurther Loft, MD; Jerrye Beavers, MD; Marcello Moores, MD; Berline Lopes, MD; Charolette Forward, MD o [address] [place], [place], [place] [address] o [phone] o Mon-Fri 8:00-5:00, Sat 9:00-12:00 o Providers come to see babies at [facility] o Does NOT accept [facility] [phone]) . [place] [facility] [place] providers accepting new patients: Dayna Ramus, NP; [place], [place], [place], [place] [address] o [phone] o Mon-Fri 8:00-5:00 o Babies seen by providers at [facility] o Does NOT accept Medicaid o Only accepting babies of parents who are patients o Please call early in hospitalization for appointment (limited availability) . [facility] Oswaldo Conroy, MD; Sheran Lawless, MD o [address] [place], [place] [address] o [phone] (press 1 to schedule appointment) o Mon-Fri 8:00-5:00 o Providers come to see babies at [facility] o Does NOT accept Medicaid . [facility] Jodi Mourning, MD o [address], [place], [place] [address] o [phone] o Mon-Fri 8:30-5:00 (lunch 12:30-1:00), extended hours by appointment only Wed 5:00-6:30 o Babies seen by [facility] providers o Accepting Medicaid . [place] at Evalyn Casco, MD; [place], MD; Ethlyn Gallery, MD o [place], [place], [place] [address] o [phone] o Mon-Fri 8:00-5:00 o Babies seen by [facility] providers o Does NOT accept Medicaid . Therapist, music at [place], MD; Yong Channel, MD; [place], [place] [address] [place], [place]  [address] o [phone] o Mon-Fri 8:00-5:00 o Babies seen by [facility] providers o Does NOT accept Medicaid . [place], [place]; [place], [place]; [place], NP; Albertina Parr, MD; Frederic Jericho, MD; Ronney Lion, MD; Carlos Levering, NP; Jerelene Redden, NP; Tomasita Crumble, NP; Ronelle Nigh, NP; Corinna Lines, MD; [place], MD o [address]., [place], [place] [address] o [phone] o Mon-Fri 8:30-5:00, Sat 10:00-1:00 o Providers come to see babies at [facility] o Does NOT accept Medicaid o Free prenatal information session Tuesdays at 4:45pm . [facility] Porfirio Oar, MD; [place], [place]; [place], [place]; [place], [place]., [place] [address] o [phone] o Mon-Fri 7:30-5:30 o Babies seen by [facility] providers . [facility] o [address] [place], [place], [place]  [address] o [phone]   Fax - [phone]  [place] [phone] & [phone]) . [place], MD o [address] [place], [place] [address] o [phone] o Mon-Thur 8:00-6:00 o Providers come to see babies at [place] Medicaid . [place], NP; Melford Aase, MD; [place], [place]; [place], [place]., [place],  [address] o [phone] o Mon-Thur 7:30-7:30, Fri 7:30-4:30 o Babies seen by [facility] providers o Accepting Medicaid . [facility] Nyra Jabs, MD; Cristino Martes, NP; Gertie Baron, MD o [place] [address], [place],  [address] o [phone] o Mon-Fri 8:30-5:00, Sat 8:30-12:00 o Providers come to see babies at [place] Medicaid o Must have "Meet & Greet" appointment at office prior to delivery . [place] ([place]) o [place],  MD; Juleen China, MD; Clydene Laming, [place] [address], [place], [place] [address] o [phone] o Mon-Wed 8:00-6:00, Thur-Fri 8:00-5:00, Sat 9:00-12:00 o Providers come to  see babies at [facility] o Does NOT accept Medicaid o Only accepting siblings of current patients . [facility] [place], [place], [place], [place]  [address] o [phone]   Fax [phone] . [place] at [place] [address], [place], [place]  [address] o [phone]   Fax - Morton Gorman [phone] & [phone]) . Therapist, music at [place], DO; [place], [place]., [place], [place] [address] o [phone] o Mon-Fri 7:00-5:00 o Babies seen by [facility] providers o Does NOT accept Medicaid . [place], MD; [place], [place]; [place], [place], [place], [place] [address] o [phone] o Mon-Fri 8:00-5:00 o Babies seen by [facility] providers o Accepting Medicaid . [place], MD; [place], [place]; [place], NP; [place], [place], [place], [place] [address] o [phone] o Mon-Fri 8:00-5:00 o Babies seen by providers at [place] [phone]) . [facility] [place], [place] o [address] [place], [place] [address] o [phone] o Mon-Fri 8:00-5:00 o Babies seen by [facility] providers o Does NOT accept Medicaid o Limited availability, please call early in hospitalization to schedule follow-up . [facility] Leilani Merl, PA; Maisie Fus, MD; [place], MD; [place], [place]; Jeannine Kitten, MD; [place], [place] [facility] [address], [place], [place] [address] o [phone] o Mon-Fri 8:30-5:00, Sat 9:00-12:00 o Babies seen by providers at [facility] o Accepting Medicaid o Please register online then schedule online or call office o [email or web address] . [place] ([place] at  [place]) Kristian Covey, NP; Dwyane Dee, MD; Leonidas Romberg, PA o [address] [place], [place], [place] [address] o [phone] o Mon-Fri 8:00-5:00 o Babies seen by providers at [place] o Accepting Medicaid . [place] ([facility] [employer]) Dairl Ponder, MD; Rayvon Char, NP; Melina Modena, MD o [address] [place], [place], [place] [address] o [phone] o Mon-Fri 8:00-5:30, Sat&Sun by appointment (phones open at 8:30) o Babies seen by [facility] providers o Accepting Medicaid o Must be a first-time baby or sibling of current patient . [place], [address], [place], [place]  [address] o [phone]   Fax - [phone]  [place] [phone] & [phone]) . [place], [place]; [place], [place]; Benjamine Mola, MD; [place], [place]; Harrell Lark, MD o [address] [place], [place] [address] o [phone] o Mon-Thur 8:00-7:00, Fri 8:00-5:00, Sat 8:00-12:00, Sun 9:00-12:00 o Babies seen by [facility] providers o Accepting Medicaid . [facility] at [facility], MD; Ruthann Cancer, MD; [facility], MD o [address], [place], [place] [address] o [phone] o Mon-Thur 8:00-5:00 o Babies seen by providers at [facility] o Accepting Medicaid . [facility] at [place], MD; Coe[place], MD; Amedeo Plenty, MD; Bobby Rumpf, MD; List, MD; Lavonia Drafts, MD; Ruthann Cancer, MD; Selinda Eon, MD; Audie Box, MD; Jim Like, MD; Christie Nottingham, MD; Hubbard Hartshorn, MD; [place] Nunnery, MD o [address] [place], [place]  [address] o [phone] o Mon-Fri 8:00-5:30, Sat (Oct.-Mar.) 9:00-1:00 o Babies seen by providers at [facility] o Accepting Medicaid o Must fill out new patient packet, available online at [email or web address] . [place] ([facility]) Barnabas Lister, NP; Kenton Kingfisher, NP; Claiborne Billings, NP; Rolla Plate, MD;  [place], [place]; [place]ola Rhine, MD; Tyron Russell, MD; Delia Chimes, NP o [address], [place], [place] [address] o [phone] o Mon-Thur 8:00-5:30, Fri 8:00-5:00 o Babies seen by providers at [place] [phone]) . [place], [place]; [place], MD; Dennard Schaumann, MD; [place], [place] o [address] [place], [place] [address] o [phone] o Mon-Fri 8:00-5:00 o Babies seen by providers at [place] [phone]) . [place] at [place], [place]; Olen Pel, MD; [place], [place], [place], [place] [address] o [phone] o Mon-Fri 8:00-5:00 o Babies seen by providers at [facility] o Does NOT accept Medicaid o Limited appointment availability, please call early in hospitalization  . Therapist, music at [place], [place]; [place], [address], [place], [place] [address] o [phone] o Mon-Fri 8:00-5:00 o Babies seen by [facility] providers o Does NOT accept Medicaid . [facility] Su Grand, MD; Guy Sandifer, MD; [place], [place]; [place], [place] [address], [place], [place] [address] o [phone] o Mon-Fri 8:00-5:00 o After hours clinic [facility] [place], [place] [address]) [phone] Mon-Fri 5:00-8:00, Sat 12:00-6:00, Sun 10:00-4:00 o Babies seen by [facility] providers o Accepting Medicaid . [place] [facility] o [address] [place], [place]  [address] o [phone]   Fax - [phone]  [place] [phone]) . Therapist, music at [facility], MD o [address] [place], [place], [place] [address] o [phone] o Mon-Fri 8:00-5:00 o Babies seen by [facility] providers o Does NOT accept Medicaid . [place] ([place] [facility] [place]) Bing Neighbors, MD o [address] [place], [place], [place]  [address] o [phone] o Mon-Thur 8:00-7:00, Fri 8:00-5:00, Sat 8:00-12:00 o Babies seen by providers at [facility] o Accepting Medicaid - but does not have vaccinations in office (must be received elsewhere) o Limited availability, please call early in hospitalization  [place] ([id number]) . [place], MD o [address], [place] [address] o [phone]  Fax [phone] Childbirth Education Options: [facility] Department Classes:  Childbirth education classes can help you get ready for a positive parenting experience. You can also meet other expectant parents and get free stuff for your baby. Each class runs for five weeks on the same night and costs $45 for the mother-to-be and her support person. Medicaid covers the cost if you are eligible. Call [phone] to register. [facility] Childbirth Education:  [phone] or [phone] or [email or web address]  Baby & Me Class: Discuss newborn & infant parenting and family adjustment issues with other new mothers in a relaxed environment. Each week brings a new speaker or baby-centered activity. We encourage new mothers to join [place] every Thursday at 11:00am. Babies birth until crawling. No registration or fee. Daddy [employer]: This course offers Dads-to-be the tools and knowledge needed to feel confident on their journey to becoming new fathers. Experienced dads, who have been trained as coaches, teach dads-to-be how to hold,  comfort, diaper, swaddle and play with their infant while being able to support the new mom as well. A class for men taught by men. $25/dad Big Brother/Big Sister: Let your children share in the joy of a new brother or sister in this special class designed just for them. Class includes discussion about how families care for babies: swaddling, holding, diapering, safety as well as how they can be helpful in their new role. This class is designed for  children ages 2 to 6, but any age is welcome. Please register each child individually. $5/child  Mom Talk: This mom-led group offers support and connection to mothers as they journey through the adjustments and struggles of that sometimes overwhelming first year after the birth of a child. Tuesdays at 10:00am and Thursdays at 6:00pm. Babies welcome. No registration or fee. Breastfeeding Support Group: This group is a mother-to-mother support circle where moms have the opportunity to share their breastfeeding experiences. A Lactation Consultant is present for questions and concerns. Meets each Tuesday at 11:00am. No fee or registration. Breastfeeding Your Baby: Learn what to expect in the first days of breastfeeding your newborn.  This class will help you feel more confident with the skills needed to begin your breastfeeding experience. Many new mothers are concerned about breastfeeding after leaving the hospital. This class will also address the most common fears and challenges about breastfeeding during the first few weeks, months and beyond. (call for fee) Comfort Techniques and Tour: This 2 hour interactive class will provide you the opportunity to learn & practice hands-on techniques that can help relieve some of the discomfort of labor and encourage your baby to rotate toward the best position for birth. You and your partner will be able to try a variety of labor positions with birth balls and rebozos as well as practice breathing, relaxation, and visualization techniques. A tour of the [facility] is included with this class. $20 per registrant and support person Childbirth Class- Weekend Option: This class is a Weekend version of our Birth & Baby series. It is designed for parents who have a difficult time fitting several weeks of classes into their schedule. It covers the care of your newborn and the basics of labor and childbirth. It also includes a Maternity Care Center Tour  of [facility] and lunch. The class is held two consecutive days: beginning on Friday evening from 6:30 - 8:30 p.m. and the next day, Saturday from 9 a.m. - 4 p.m. (call for fee) Waterbirth Class: Interested in a waterbirth?  This informational class will help you discover whether waterbirth is the right fit for you. Education about waterbirth itself, supplies you would need and how to assemble your support team is what you can expect from this class. Some obstetrical practices require this class in order to pursue a waterbirth. (Not all obstetrical practices offer waterbirth-check with your healthcare provider.) Register only the expectant mom, but you are encouraged to bring your partner to class! Required if planning waterbirth, no fee. Infant/Child CPR: Parents, grandparents, babysitters, and friends learn Cardio-Pulmonary Resuscitation skills for infants and children. You will also learn how to treat both conscious and unconscious choking in infants and children. This Family & Friends program does not offer certification. Register each participant individually to ensure that enough mannequins are available. (Call for fee) Grandparent Love: Expecting a grandbaby? This class is for you! Learn about the latest infant care and safety recommendations and ways to support your own child as he   or she transitions into the parenting role. Taught by Registered Nurses who are childbirth instructors, but most importantly...they are grandmothers too! $10/person. Childbirth Class- Natural Childbirth: This series of 5 weekly classes is for expectant parents who want to learn and practice natural methods of coping with the process of labor and childbirth. Relaxation, breathing, massage, visualization, role of the partner, and helpful positioning are highlighted. Participants learn how to be confident in their body's ability to give birth. This class will empower and help parents make informed decisions about their own  care. Includes discussion that will help new parents transition into the immediate postpartum period. [facility] is included. We suggest taking this class between 25-32 weeks, but it's only a recommendation. $75 per registrant and one support person or $30 Medicaid. Childbirth Class- 3 week Series: This option of 3 weekly classes helps you and your labor partner prepare for childbirth. Newborn care, labor & birth, cesarean birth, pain management, and comfort techniques are discussed and a [facility] is included. The class meets at the same time, on the same day of the week for 3 consecutive weeks beginning with the starting date you choose. $60 for registrant and one support person.  Marvelous Multiples: Expecting twins, triplets, or more? This class covers the differences in labor, birth, parenting, and breastfeeding issues that face multiples' parents. NICU tour is included. Led by a Certified Childbirth Educator who is the mother of twins. No fee. Caring for Baby: This class is for expectant and adoptive parents who want to learn and practice the most up-to-date newborn care for their babies. Focus is on birth through the first six weeks of life. Topics include feeding, bathing, diapering, crying, umbilical cord care, circumcision care and safe sleep. Parents learn to recognize symptoms of illness and when to call the pediatrician. Register only the mom-to-be and your partner or support person can plan to come with you! $10 per registrant and support person Childbirth Class- online option: This online class offers you the freedom to complete a Birth and Baby series in the comfort of your own home. The flexibility of this option allows you to review sections at your own pace, at times convenient to you and your support people. It includes additional video information, animations, quizzes, and extended activities. Get organized with helpful  eClass tools, checklists, and trackers. Once you register online for the class, you will receive an email within a few days to accept the invitation and begin the class when the time is right for you. The content will be available to you for 60 days. $60 for 60 days of online access for you and your support people.  Local Doulas: Natural Baby Doulas [email or web address] Tel: [phone] [email or web address] [employer] [phone] [email or web address] .[email or web address] The Labor Hassell Halim  (also do waterbirth tub rental) [phone] [email or web address] .[email or web address] Triad Birth Doula [phone] [email or web address] NotebookDistributors.fi Sacred Rhythms  [phone] https://[email or web address]-[email or web address]/ [employer] (PADA) [email or web address]://www.[email or web address]/ [place] Birth and Baby  [email or web address] Considering Waterbirth? Guide for patients at Center for [employer]  Why consider waterbirth?  . Gentle birth for babies . Less pain medicine used in labor . May allow for passive descent/less pushing . May reduce perineal tears  . More mobility and instinctive maternal position changes . Increased maternal relaxation . Reduced blood pressure in labor  Is waterbirth safe? What are the risks of infection, drowning or other complications?  . Infection: o Very low risk (3.7 % for tub  vs 4.8% for bed) o 7 in 8000 waterbirths with documented infection o Poorly cleaned equipment most common cause o Slightly lower group B strep transmission rate  . Drowning o Maternal:  - Very low risk   - Related to seizures or fainting o Newborn:  - Very low risk. No evidence of increased risk of respiratory problems in multiple large studies - Physiological protection from breathing under water - Avoid underwater birth if there are any fetal  complications - Once baby's head is out of the water, keep it out.  . Birth complication o Some reports of cord trauma, but risk decreased by bringing baby to surface gradually o No evidence of increased risk of shoulder dystocia. Mothers can usually change positions faster in water than in a bed, possibly aiding the maneuvers to free the shoulder.   You must attend a Waterbirth class at [facility]  3rd Wednesday of every month from 7-9pm  Free  Register by calling [phone] or online at [email or web address]  Bring us the certificate from the class to your prenatal appointment  Meet with a midwife at 36 weeks to see if you can still plan a waterbirth and to sign the consent.   Purchase or rent the following supplies:   Water Birth Pool (Birth Pool in a Box or [employer] for instance)  (Tubs start ~$125)  Single-use disposable tub liner designed for your brand of tub  New garden hose labeled "lead-free", "suitable for drinking water",  Electric drain pump to remove water (We recommend 792 gallon per hour or greater pump.)   Separate garden hose to remove the dirty water  Fish net  Bathing suit top (optional)  Long-handled mirror (optional)  Places to purchase or rent supplies  Yourwaterbirth.com for tub purchases and supplies  Waterbirthsolutions.com for tub purchases and supplies  [employer] (www.thelaborladies.com) $275 for tub rental/set-up & take down/kit   [employer] [employer] (http://www.padanc.org/MeetUs.htm) Information regarding doulas (labor support) who provide pool rentals  Our practice has a Birth Pool in a Box tub at the hospital that you may borrow on a first-come-first-served basis. It is your responsibility to to set up, clean and break down the tub. We cannot guarantee the availability of this tub in advance. You are responsible for bringing all accessories listed above. If you do not have all necessary supplies you cannot  have a waterbirth.    Things that would prevent you from having a waterbirth:  Premature, <37wks  Previous cesarean birth  Presence of thick meconium-stained fluid  Multiple gestation (Twins, triplets, etc.)  Uncontrolled diabetes or gestational diabetes requiring medication  Hypertension requiring medication or diagnosis of pre-eclampsia  Heavy vaginal bleeding  Non-reassuring fetal heart rate  Active infection (MRSA, etc.). Group B Strep is NOT a contraindication for  waterbirth.  If your labor has to be induced and induction method requires continuous  monitoring of the baby's heart rate  Other risks/issues identified by your obstetrical provider  Please remember that birth is unpredictable. Under certain unforeseeable circumstances your provider may advise against giving birth in the tub. These decisions will be made on a case-by-case basis and with the safety of you and your baby as our highest priority.     

## 2018-08-16 NOTE — Progress Notes (Signed)
BP cuff ordered and message sent to front office staff to fax order to pharmacy. Pt already has babyscripts downloaded and will enter in the blood pressure from today's visit into the app.

## 2018-08-17 ENCOUNTER — Encounter: Payer: Self-pay | Admitting: *Deleted

## 2018-08-17 ENCOUNTER — Telehealth: Payer: Self-pay | Admitting: *Deleted

## 2018-08-17 LAB — OBSTETRIC PANEL, INCLUDING HIV
Antibody Screen: NEGATIVE
Basophils Absolute: 0 10*3/uL (ref 0.0–0.2)
Basos: 0 %
EOS (ABSOLUTE): 0.1 10*3/uL (ref 0.0–0.4)
Eos: 1 %
HIV Screen 4th Generation wRfx: NONREACTIVE
Hematocrit: 30.1 % — ABNORMAL LOW (ref 34.0–46.6)
Hemoglobin: 10 g/dL — ABNORMAL LOW (ref 11.1–15.9)
Hepatitis B Surface Ag: NEGATIVE
Immature Grans (Abs): 0 10*3/uL (ref 0.0–0.1)
Immature Granulocytes: 0 %
Lymphocytes Absolute: 1.9 10*3/uL (ref 0.7–3.1)
Lymphs: 28 %
MCH: 27.7 pg (ref 26.6–33.0)
MCHC: 33.2 g/dL (ref 31.5–35.7)
MCV: 83 fL (ref 79–97)
Monocytes Absolute: 0.6 10*3/uL (ref 0.1–0.9)
Monocytes: 10 %
Neutrophils Absolute: 4.1 10*3/uL (ref 1.4–7.0)
Neutrophils: 61 %
Platelets: 204 10*3/uL (ref 150–450)
RBC: 3.61 x10E6/uL — ABNORMAL LOW (ref 3.77–5.28)
RDW: 13.9 % (ref 11.7–15.4)
RPR Ser Ql: NONREACTIVE
Rh Factor: POSITIVE
Rubella Antibodies, IGG: 2.01 index (ref >0.99)
WBC: 6.7 10*3/uL (ref 3.4–10.8)

## 2018-08-17 LAB — COMPREHENSIVE METABOLIC PANEL
ALT: 15 IU/L (ref 0–32)
AST: 14 IU/L (ref 0–40)
Albumin/Globulin Ratio: 1.6 (ref 1.2–2.2)
Albumin: 3.9 g/dL (ref 3.9–5.0)
Alkaline Phosphatase: 38 IU/L — ABNORMAL LOW (ref 39–117)
BUN/Creatinine Ratio: 16 (ref 9–23)
BUN: 12 mg/dL (ref 6–20)
Bilirubin Total: <0.2 mg/dL (ref 0.0–1.2)
CO2: 20 mmol/L (ref 20–29)
Calcium: 9 mg/dL (ref 8.7–10.2)
Chloride: 102 mmol/L (ref 96–106)
Creatinine, Ser: 0.75 mg/dL (ref 0.57–1.00)
GFR calc Af Amer: 128 mL/min/{1.73_m2} (ref >59)
GFR calc non Af Amer: 111 mL/min/{1.73_m2} (ref >59)
Globulin, Total: 2.5 g/dL (ref 1.5–4.5)
Glucose: 70 mg/dL (ref 65–99)
Potassium: 4.3 mmol/L (ref 3.5–5.2)
Sodium: 135 mmol/L (ref 134–144)
Total Protein: 6.4 g/dL (ref 6.0–8.5)

## 2018-08-17 LAB — TSH: TSH: 0.257 u[IU]/mL — ABNORMAL LOW (ref 0.450–4.500)

## 2018-08-17 LAB — PROTEIN / CREATININE RATIO, URINE
Creatinine, Urine: 122.8 mg/dL
Protein, Ur: 7.6 mg/dL
Protein/Creat Ratio: 62 mg/g creat (ref 0–200)

## 2018-08-17 LAB — HEMOGLOBIN A1C
Est. average glucose Bld gHb Est-mCnc: 111 mg/dL
Hgb A1c MFr Bld: 5.5 % (ref 4.8–5.6)

## 2018-08-17 NOTE — Telephone Encounter (Signed)
-----   Message from Dolores Hoose, RN sent at 08/16/2018  2:42 PM EDT ----- Pt needs anatomy ultrasound scheduled

## 2018-08-17 NOTE — Telephone Encounter (Signed)
Anatomy ultrasound scheduled for 10/14/18 @ 1115.  Pt notified via [employer].

## 2018-08-18 ENCOUNTER — Other Ambulatory Visit: Payer: Self-pay | Admitting: *Deleted

## 2018-08-18 ENCOUNTER — Encounter: Payer: Self-pay | Admitting: Obstetrics & Gynecology

## 2018-08-18 DIAGNOSIS — O99019 Anemia complicating pregnancy, unspecified trimester: Secondary | ICD-10-CM | POA: Insufficient documentation

## 2018-08-18 DIAGNOSIS — Z3490 Encounter for supervision of normal pregnancy, unspecified, unspecified trimester: Secondary | ICD-10-CM

## 2018-08-18 DIAGNOSIS — R7989 Other specified abnormal findings of blood chemistry: Secondary | ICD-10-CM | POA: Insufficient documentation

## 2018-08-18 LAB — URINE CULTURE, OB REFLEX

## 2018-08-18 LAB — CULTURE, OB URINE

## 2018-08-18 NOTE — Progress Notes (Signed)
Per Dr. Alease Medina request Free T4 and Free T3 added to pt's labwork.

## 2018-08-20 LAB — T4, FREE: Free T4: 1.16 ng/dL (ref 0.82–1.77)

## 2018-08-20 LAB — SPECIMEN STATUS REPORT

## 2018-08-20 LAB — T3, FREE: T3, Free: 2.9 pg/mL (ref 2.0–4.4)

## 2018-08-23 ENCOUNTER — Encounter: Payer: Self-pay | Admitting: *Deleted

## 2018-08-25 DIAGNOSIS — Z3490 Encounter for supervision of normal pregnancy, unspecified, unspecified trimester: Secondary | ICD-10-CM | POA: Diagnosis not present

## 2018-08-26 ENCOUNTER — Encounter: Payer: Self-pay | Admitting: *Deleted

## 2018-09-10 ENCOUNTER — Telehealth: Payer: Self-pay | Admitting: Obstetrics & Gynecology

## 2018-09-10 NOTE — Telephone Encounter (Signed)
Called the patient to inform of the upcoming virtual visit. Informed the patient of logging in 15 minutes early and if the provider has not logged in within 15 mintues of the appointment time please call our office. Informed the patient of how to log into the mychart and access the appointment. The patient verbalized understanding. °

## 2018-09-13 ENCOUNTER — Encounter: Payer: Self-pay | Admitting: Advanced Practice Midwife

## 2018-09-13 ENCOUNTER — Telehealth (INDEPENDENT_AMBULATORY_CARE_PROVIDER_SITE_OTHER): Payer: Medicaid Other | Admitting: Advanced Practice Midwife

## 2018-09-13 ENCOUNTER — Other Ambulatory Visit: Payer: Self-pay

## 2018-09-13 DIAGNOSIS — Z3A14 14 weeks gestation of pregnancy: Secondary | ICD-10-CM | POA: Diagnosis not present

## 2018-09-13 DIAGNOSIS — Z3490 Encounter for supervision of normal pregnancy, unspecified, unspecified trimester: Secondary | ICD-10-CM

## 2018-09-13 DIAGNOSIS — Z3492 Encounter for supervision of normal pregnancy, unspecified, second trimester: Secondary | ICD-10-CM | POA: Diagnosis not present

## 2018-09-13 NOTE — Progress Notes (Signed)
Pt at work and unable to take b/p. States she takes her bp weekly and has app

## 2018-09-13 NOTE — Progress Notes (Signed)
   TELEHEALTH VIRTUAL OBSTETRICS VISIT ENCOUNTER NOTE  I connected with Kristina Alvarado on 09/13/18 at  8:15 AM EDT by telephone at home and verified that I am speaking with the correct person using two identifiers.   I discussed the limitations, risks, security and privacy concerns of performing an evaluation and management service by telephone and the availability of in person appointments. I also discussed with the patient that there may be a patient responsible charge related to this service. The patient expressed understanding and agreed to proceed.  Subjective:  Kristina Alvarado is a 28 y.o. G2P0010 at [redacted]w[redacted]d being followed for ongoing prenatal care.  She is currently monitored for the following issues for this low-risk pregnancy and has Chlamydia; Visit for routine gyn exam; Contraception management; Supervision of low-risk pregnancy; Pregnancy with history of ectopic pregnancy; Abnormal TSH; and Anemia in pregnancy on their problem list.  Patient reports nausea. Reports fetal movement. Denies any contractions, bleeding or leaking of fluid.   The following portions of the patient's history were reviewed and updated as appropriate: allergies, current medications, past family history, past medical history, past social history, past surgical history and problem list.   Objective:   General:  Alert, oriented and cooperative.   Mental Status: Normal mood and affect perceived. Normal judgment and thought content.  Rest of physical exam deferred due to type of encounter  Assessment and Plan:  Pregnancy: G2P0010 at [redacted]w[redacted]d 1. Encounter for supervision of low-risk pregnancy, antepartum - Routine care - [place] scheduled   Preterm labor symptoms and general obstetric precautions including but not limited to vaginal bleeding, contractions, leaking of fluid and fetal movement were reviewed in detail with the patient.  I discussed the assessment and treatment plan with the patient. The patient was  provided an opportunity to ask questions and all were answered. The patient agreed with the plan and demonstrated an understanding of the instructions. The patient was advised to call back or seek an in-person office evaluation/go to [facility]U at [facility] for any urgent or concerning symptoms. Please refer to After Visit Summary for other counseling recommendations.   I provided 10 minutes of non-face-to-face time during this encounter.  Return in about 4 weeks (around 10/11/2018).  Future Appointments  Date Time Provider [place]  10/11/2018  9:15 AM Emily Filbert, MD [facility] WOC  10/14/2018 11:15 AM [place] 4 WH-MFCUS MFC-US    Marcille Buffy DNP, CNM  09/13/18  8:28 AM  [facility] [employer]

## 2018-09-27 ENCOUNTER — Other Ambulatory Visit (HOSPITAL_COMMUNITY)
Admission: RE | Admit: 2018-09-27 | Discharge: 2018-09-27 | Disposition: A | Discharge status: Home / Self Care | Payer: Medicaid Other | Source: Ambulatory Visit | Attending: Obstetrics & Gynecology | Admitting: Obstetrics & Gynecology

## 2018-09-27 ENCOUNTER — Ambulatory Visit (INDEPENDENT_AMBULATORY_CARE_PROVIDER_SITE_OTHER): Payer: Medicaid Other | Admitting: General Practice

## 2018-09-27 ENCOUNTER — Other Ambulatory Visit: Payer: Self-pay

## 2018-09-27 DIAGNOSIS — N898 Other specified noninflammatory disorders of vagina: Secondary | ICD-10-CM | POA: Diagnosis not present

## 2018-09-27 DIAGNOSIS — N760 Acute vaginitis: Secondary | ICD-10-CM

## 2018-09-27 NOTE — Progress Notes (Signed)
Patient seen and assessed by nursing staff during this encounter. I have reviewed the chart and agree with the documentation and plan.  Verita Schneiders, MD 09/27/2018 11:47 AM

## 2018-09-27 NOTE — Progress Notes (Signed)
Patient presents to office today reporting yellow discharge for the past week with slight itching. Patient would like STD testing as well. Instructed her in self swab & specimen collected. Discussed results will be back in 24-48 hours and she will be contacted via mychart with results and any needed prescriptions. Patient verbalized understanding & had no questions.  Koren Bound RN BSN 09/27/18

## 2018-09-29 LAB — CERVICOVAGINAL ANCILLARY ONLY
Bacterial vaginitis: POSITIVE — AB
Candida vaginitis: POSITIVE — AB
Chlamydia: NEGATIVE
Neisseria Gonorrhea: NEGATIVE
Trichomonas: NEGATIVE

## 2018-09-29 MED ORDER — TERCONAZOLE 0.8 % VA CREA: 1.0000 | TOPICAL_CREAM | Freq: Every day | VAGINAL | 0 refills | Status: DC

## 2018-09-29 MED ORDER — METRONIDAZOLE 500 MG PO TABS: 500.0000 mg | ORAL_TABLET | Freq: Two times a day (BID) | ORAL | 0 refills | Status: DC

## 2018-09-29 NOTE — Addendum Note (Signed)
Addended by: Verita Schneiders A on: 09/29/2018 03:37 PM   Modules accepted: Orders

## 2018-10-08 ENCOUNTER — Telehealth: Payer: Self-pay | Admitting: Obstetrics & Gynecology

## 2018-10-08 NOTE — Telephone Encounter (Signed)
Spoke with patient about her appointment on 8/31 @ 9:15. Patient instructed that the appointment is a mychart visit. Patient instructed to download the mychart app if not already done so. Patient verbalized she has the app downloaded

## 2018-10-11 ENCOUNTER — Telehealth (INDEPENDENT_AMBULATORY_CARE_PROVIDER_SITE_OTHER): Payer: Medicaid Other | Admitting: Obstetrics & Gynecology

## 2018-10-11 ENCOUNTER — Other Ambulatory Visit: Payer: Self-pay

## 2018-10-11 ENCOUNTER — Encounter: Payer: Self-pay | Admitting: Obstetrics & Gynecology

## 2018-10-11 DIAGNOSIS — Z3A18 18 weeks gestation of pregnancy: Secondary | ICD-10-CM

## 2018-10-11 DIAGNOSIS — Z3490 Encounter for supervision of normal pregnancy, unspecified, unspecified trimester: Secondary | ICD-10-CM

## 2018-10-11 DIAGNOSIS — R7989 Other specified abnormal findings of blood chemistry: Secondary | ICD-10-CM | POA: Diagnosis not present

## 2018-10-11 DIAGNOSIS — O99012 Anemia complicating pregnancy, second trimester: Secondary | ICD-10-CM | POA: Diagnosis not present

## 2018-10-11 DIAGNOSIS — O0912 Supervision of pregnancy with history of ectopic or molar pregnancy, second trimester: Secondary | ICD-10-CM

## 2018-10-11 NOTE — Progress Notes (Signed)
I connected with  Kristina Alvarado on 10/11/18 at  9:15 AM EDT by telephone and verified that I am speaking with the correct person using two identifiers.   I discussed the limitations, risks, security and privacy concerns of performing an evaluation and management service by telephone and the availability of in person appointments. I also discussed with the patient that there may be a patient responsible charge related to this service. The patient expressed understanding and agreed to proceed.  [place], Shelley 10/11/2018  9:16 AM

## 2018-10-11 NOTE — Progress Notes (Signed)
   PRENATAL VISIT NOTE  Subjective:  Kristina Alvarado is a 28 y.o. G2P0010 at [redacted]w[redacted]d being seen today for ongoing prenatal care.  She is currently monitored for the following issues for this low-risk pregnancy and has Chlamydia; Visit for routine gyn exam; Supervision of low-risk pregnancy; Abnormal TSH; and Anemia in pregnancy on their problem list.  Patient reports no complaints.  Contractions: Not present. Vag. Bleeding: None.  Movement: Present. Denies leaking of fluid.   The following portions of the patient's history were reviewed and updated as appropriate: allergies, current medications, past family history, past medical history, past social history, past surgical history and problem list.   Objective:   Vitals:   10/11/18 0918  BP: (!) 145/84  Pulse: 71    Fetal Status:     Movement: Present     General:  Alert, oriented and cooperative. Patient is in no acute distress.  Skin: Skin is warm and dry. No rash noted.   Cardiovascular: Normal heart rate noted  Respiratory: Normal respiratory effort, no problems with respiration noted  Abdomen: Soft, gravid, appropriate for gestational age.  Pain/Pressure: Absent     Pelvic: Cervical exam deferred        Extremities: Normal range of motion.  Edema: Trace  Mental Status: Normal mood and affect. Normal behavior. Normal judgment and thought content.   Assessment and Plan:  Pregnancy: G2P0010 at [redacted]w[redacted]d 1. Abnormal TSH - follow up T4 and T3 normal  2. Anemia during pregnancy in second trimester - rec OTC iron supplements  3. Encounter for supervision of low-risk pregnancy, antepartum  4. Elevated BP today at home- she will come in next week for BP check and weight - MFM u/s on 10-14-18  5. Obestiy in pregnancy- rec'd 20 pound total in pregnancy    Preterm labor symptoms and general obstetric precautions including but not limited to vaginal bleeding, contractions, leaking of fluid and fetal movement were reviewed in detail  with the patient. Please refer to After Visit Summary for other counseling recommendations.   Return in about 1 week (around 10/18/2018) for in person for a weight and BP check.  Future Appointments  Date Time Provider [place]  10/14/2018 11:15 AM WH-MFC [place] 4 WH-MFCUS MFC-US    Emily Filbert, MD

## 2018-10-14 ENCOUNTER — Ambulatory Visit (HOSPITAL_COMMUNITY)
Admission: RE | Admit: 2018-10-14 | Discharge: 2018-10-14 | Disposition: A | Discharge status: Home / Self Care | Payer: Medicaid Other | Source: Ambulatory Visit | Attending: Obstetrics and Gynecology | Admitting: Obstetrics and Gynecology

## 2018-10-14 ENCOUNTER — Other Ambulatory Visit: Payer: Self-pay | Admitting: Obstetrics & Gynecology

## 2018-10-14 ENCOUNTER — Other Ambulatory Visit: Payer: Self-pay

## 2018-10-14 DIAGNOSIS — Z363 Encounter for antenatal screening for malformations: Secondary | ICD-10-CM | POA: Diagnosis not present

## 2018-10-14 DIAGNOSIS — Z3490 Encounter for supervision of normal pregnancy, unspecified, unspecified trimester: Secondary | ICD-10-CM | POA: Insufficient documentation

## 2018-10-14 DIAGNOSIS — Z3A19 19 weeks gestation of pregnancy: Secondary | ICD-10-CM

## 2018-10-14 DIAGNOSIS — O99212 Obesity complicating pregnancy, second trimester: Secondary | ICD-10-CM

## 2018-10-15 ENCOUNTER — Other Ambulatory Visit (HOSPITAL_COMMUNITY): Payer: Self-pay | Admitting: *Deleted

## 2018-10-15 DIAGNOSIS — Z362 Encounter for other antenatal screening follow-up: Secondary | ICD-10-CM

## 2018-11-01 ENCOUNTER — Other Ambulatory Visit: Payer: Self-pay

## 2018-11-01 ENCOUNTER — Telehealth: Payer: Medicaid Other | Admitting: Advanced Practice Midwife

## 2018-11-01 DIAGNOSIS — Z5329 Procedure and treatment not carried out because of patient's decision for other reasons: Secondary | ICD-10-CM

## 2018-11-01 DIAGNOSIS — Z91199 Patient's noncompliance with other medical treatment and regimen due to unspecified reason: Secondary | ICD-10-CM

## 2018-11-01 NOTE — Progress Notes (Signed)
Patient did not answer for her visit.   Marcille Buffy DNP, CNM  11/01/18  4:50 PM

## 2018-11-02 ENCOUNTER — Other Ambulatory Visit: Payer: Self-pay

## 2018-11-02 ENCOUNTER — Encounter: Payer: Self-pay | Admitting: Student

## 2018-11-02 ENCOUNTER — Telehealth (INDEPENDENT_AMBULATORY_CARE_PROVIDER_SITE_OTHER): Payer: Medicaid Other | Admitting: Student

## 2018-11-02 DIAGNOSIS — R7989 Other specified abnormal findings of blood chemistry: Secondary | ICD-10-CM | POA: Diagnosis not present

## 2018-11-02 DIAGNOSIS — Z3A27 27 weeks gestation of pregnancy: Secondary | ICD-10-CM

## 2018-11-02 DIAGNOSIS — O99012 Anemia complicating pregnancy, second trimester: Secondary | ICD-10-CM

## 2018-11-02 DIAGNOSIS — Z3490 Encounter for supervision of normal pregnancy, unspecified, unspecified trimester: Secondary | ICD-10-CM

## 2018-11-02 NOTE — Patient Instructions (Addendum)
-If your blood pressure is higher than 140 on the top number or 90 on the bottom number, rest, drink and eat something, and then take your Blood pressure again. If still elevated, please come to the Maternity Admission Unit.   Glucose Tolerance Test During Pregnancy Why am I having this test? The glucose tolerance test (GTT) is done to check how your body processes sugar (glucose). This is one of several tests used to diagnose diabetes that develops during pregnancy (gestational diabetes mellitus). Gestational diabetes is a temporary form of diabetes that some women develop during pregnancy. It usually occurs during the second trimester of pregnancy and goes away after delivery. Testing (screening) for gestational diabetes usually occurs between 24 and 28 weeks of pregnancy. You may have the GTT test after having a 1-hour glucose screening test if the results from that test indicate that you may have gestational diabetes. You may also have this test if:  You have a history of gestational diabetes.  You have a history of giving birth to very large babies or have experienced repeated fetal loss (stillbirth).  You have signs and symptoms of diabetes, such as: ? Changes in your vision. ? Tingling or numbness in your hands or feet. ? Changes in hunger, thirst, and urination that are not otherwise explained by your pregnancy. What is being tested? This test measures the amount of glucose in your blood at different times during a period of 3 hours. This indicates how well your body is able to process glucose. What kind of sample is taken?  Blood samples are required for this test. They are usually collected by inserting a needle into a blood vessel. How do I prepare for this test?  For 3 days before your test, eat normally. Have plenty of carbohydrate-rich foods.  Follow instructions from your health care provider about: ? Eating or drinking restrictions on the day of the test. You may be asked to  not eat or drink anything other than water (fast) starting 8-10 hours before the test. ? Changing or stopping your regular medicines. Some medicines may interfere with this test. Tell a health care provider about:  All medicines you are taking, including vitamins, herbs, eye drops, creams, and over-the-counter medicines.  Any blood disorders you have.  Any surgeries you have had.  Any medical conditions you have. What happens during the test? First, your blood glucose will be measured. This is referred to as your fasting blood glucose, since you fasted before the test. Then, you will drink a glucose solution that contains a certain amount of glucose. Your blood glucose will be measured again 1, 2, and 3 hours after drinking the solution. This test takes about 3 hours to complete. You will need to stay at the testing location during this time. During the testing period:  Do not eat or drink anything other than the glucose solution.  Do not exercise.  Do not use any products that contain nicotine or tobacco, such as cigarettes and e-cigarettes. If you need help stopping, ask your health care provider. The testing procedure may vary among health care providers and hospitals. How are the results reported? Your results will be reported as milligrams of glucose per deciliter of blood (mg/dL) or millimoles per liter (mmol/L). Your health care provider will compare your results to normal ranges that were established after testing a large group of people (reference ranges). Reference ranges may vary among labs and hospitals. For this test, common reference ranges are:  Fasting: less  than 95-105 mg/dL (6.6-2.9 mmol/L).  1 hour after drinking glucose: less than 180-190 mg/dL (47.6-54.6 mmol/L).  2 hours after drinking glucose: less than 155-165 mg/dL (5.0-3.5 mmol/L).  3 hours after drinking glucose: 140-145 mg/dL (4.6-5.6 mmol/L). What do the results mean? Results within reference ranges are  considered normal, meaning that your glucose levels are well-controlled. If two or more of your blood glucose levels are high, you may be diagnosed with gestational diabetes. If only one level is high, your health care provider may suggest repeat testing or other tests to confirm a diagnosis. Talk with your health care provider about what your results mean. Questions to ask your health care provider Ask your health care provider, or the department that is doing the test:  When will my results be ready?  How will I get my results?  What are my treatment options?  What other tests do I need?  What are my next steps? Summary  The glucose tolerance test (GTT) is one of several tests used to diagnose diabetes that develops during pregnancy (gestational diabetes mellitus). Gestational diabetes is a temporary form of diabetes that some women develop during pregnancy.  You may have the GTT test after having a 1-hour glucose screening test if the results from that test indicate that you may have gestational diabetes. You may also have this test if you have any symptoms or risk factors for gestational diabetes.  Talk with your health care provider about what your results mean. This information is not intended to replace advice given to you by your health care provider. Make sure you discuss any questions you have with your health care provider. Document Released: 07/29/2011 Document Revised: 05/20/2018 Document Reviewed: 09/08/2016 Elsevier Patient Education  2020 [employer].

## 2018-11-02 NOTE — Progress Notes (Signed)
Patient ID: Kristina Alvarado, female   DOB: 08-Dec-1990, 28 y.o.   MRN: [id number]   PRENATAL VISIT NOTE  Subjective:  Kristina Alvarado is a 28 y.o. G2P0010 at [redacted]w[redacted]d being seen today for ongoing prenatal care.  She is currently monitored for the following issues for this low-risk pregnancy and has Chlamydia; Visit for routine gyn exam; Supervision of low-risk pregnancy; Abnormal TSH; and Anemia in pregnancy on their problem list.  Patient reports no complaints.  Contractions: Not present. Vag. Bleeding: None.  Movement: Present. Denies leaking of fluid.   The following portions of the patient's history were reviewed and updated as appropriate: allergies, current medications, past family history, past medical history, past social history, past surgical history and problem list.   Objective:  There were no vitals filed for this visit.  Fetal Status:     Movement: Present     General:  Alert, oriented and cooperative. Patient is in no acute distress.  Skin: Skin is warm and dry. No rash noted.   Cardiovascular: Normal heart rate noted  Respiratory: Normal respiratory effort, no problems with respiration noted  Abdomen: Soft, gravid, appropriate for gestational age.  Pain/Pressure: Absent     Pelvic: Cervical exam deferred        Extremities: Normal range of motion.  Edema: Trace  Mental Status: Normal mood and affect. Normal behavior. Normal judgment and thought content.   Assessment and Plan:  Pregnancy: G2P0010 at [redacted]w[redacted]d 1. Abnormal TSH -recheck TSh at next visit (28 week visit); TSh was low early on due to emesis, most likely.   2. Anemia during pregnancy in second trimester -she is taking prenatal vitamins with iron in it, denies symptoms today.   3. Encounter for supervision of low-risk pregnancy, antepartum -will check her blood pressure once a week and put it into baby Rx, reviewed warning signs.   Preterm labor symptoms and general obstetric precautions including but not  limited to vaginal bleeding, contractions, leaking of fluid and fetal movement were reviewed in detail with the patient. Please refer to After Visit Summary for other counseling recommendations.   Return in about 7 weeks (around 12/21/2018), or [facility] and 2hour gtt.  Future Appointments  Date Time Provider [place]  11/11/2018  9:15 AM WH-MFC [place], [place]

## 2018-11-02 NOTE — Progress Notes (Signed)
Pt states right now she does not have her BP Cuff available, asked if she could notate readings in BRx when she has it.

## 2018-11-11 ENCOUNTER — Other Ambulatory Visit (HOSPITAL_COMMUNITY): Payer: Self-pay | Admitting: *Deleted

## 2018-11-11 ENCOUNTER — Other Ambulatory Visit: Payer: Self-pay

## 2018-11-11 ENCOUNTER — Other Ambulatory Visit (HOSPITAL_COMMUNITY): Payer: Self-pay | Admitting: Maternal & Fetal Medicine

## 2018-11-11 ENCOUNTER — Ambulatory Visit (HOSPITAL_COMMUNITY)
Admission: RE | Admit: 2018-11-11 | Discharge: 2018-11-11 | Disposition: A | Discharge status: Home / Self Care | Payer: Medicaid Other | Source: Ambulatory Visit | Attending: Maternal & Fetal Medicine | Admitting: Maternal & Fetal Medicine

## 2018-11-11 DIAGNOSIS — Z362 Encounter for other antenatal screening follow-up: Secondary | ICD-10-CM | POA: Diagnosis not present

## 2018-11-11 DIAGNOSIS — O99212 Obesity complicating pregnancy, second trimester: Secondary | ICD-10-CM | POA: Diagnosis not present

## 2018-11-11 DIAGNOSIS — O365990 Maternal care for other known or suspected poor fetal growth, unspecified trimester, not applicable or unspecified: Secondary | ICD-10-CM

## 2018-11-11 DIAGNOSIS — O365920 Maternal care for other known or suspected poor fetal growth, second trimester, not applicable or unspecified: Secondary | ICD-10-CM | POA: Diagnosis not present

## 2018-11-11 DIAGNOSIS — Z3A23 23 weeks gestation of pregnancy: Secondary | ICD-10-CM

## 2018-11-25 ENCOUNTER — Ambulatory Visit (HOSPITAL_COMMUNITY)
Admission: RE | Admit: 2018-11-25 | Discharge: 2018-11-25 | Disposition: A | Discharge status: Home / Self Care | Payer: Medicaid Other | Source: Ambulatory Visit | Attending: Obstetrics and Gynecology | Admitting: Obstetrics and Gynecology

## 2018-11-25 ENCOUNTER — Encounter (HOSPITAL_COMMUNITY): Payer: Self-pay | Admitting: *Deleted

## 2018-11-25 ENCOUNTER — Other Ambulatory Visit: Payer: Self-pay

## 2018-11-25 ENCOUNTER — Ambulatory Visit (HOSPITAL_COMMUNITY): Payer: Medicaid Other | Admitting: *Deleted

## 2018-11-25 DIAGNOSIS — O99012 Anemia complicating pregnancy, second trimester: Secondary | ICD-10-CM | POA: Insufficient documentation

## 2018-11-25 DIAGNOSIS — O365920 Maternal care for other known or suspected poor fetal growth, second trimester, not applicable or unspecified: Secondary | ICD-10-CM

## 2018-11-25 DIAGNOSIS — Z3490 Encounter for supervision of normal pregnancy, unspecified, unspecified trimester: Secondary | ICD-10-CM | POA: Diagnosis not present

## 2018-11-25 DIAGNOSIS — O99212 Obesity complicating pregnancy, second trimester: Secondary | ICD-10-CM | POA: Diagnosis not present

## 2018-11-25 DIAGNOSIS — R7989 Other specified abnormal findings of blood chemistry: Secondary | ICD-10-CM | POA: Diagnosis not present

## 2018-11-25 DIAGNOSIS — O365990 Maternal care for other known or suspected poor fetal growth, unspecified trimester, not applicable or unspecified: Secondary | ICD-10-CM | POA: Diagnosis not present

## 2018-11-25 DIAGNOSIS — Z3A25 25 weeks gestation of pregnancy: Secondary | ICD-10-CM | POA: Diagnosis not present

## 2018-11-29 ENCOUNTER — Telehealth: Payer: Self-pay | Admitting: *Deleted

## 2018-11-29 NOTE — Telephone Encounter (Addendum)
I called Kristina Alvarado and she reports that the outside of her left ankle is swollen and hurts. She denies knowing of any injury she may have had. She denies that her left foot or leg is swollen. She denies that her right foot, ankle or leg is swollen. I asked if she  Had pain in her lower leg when she flexes her left foot up and she reports it  Hurts in her ankle, not lower leg. She also denies any area of redness or warmth to touch.  I discussed with her that we worry about DVT in pregnancy so if she has pain in her lower leg she should go to ER at [facility]. I advised since it is  Her ankle I recommend going to urgent care. She voices understanding.  Linda,RN

## 2018-11-29 NOTE — Telephone Encounter (Signed)
Trinita left a voicemail she is having problems with her ankles and wants to know what should she do?  Matheu Ploeger,RN

## 2018-11-30 ENCOUNTER — Ambulatory Visit (HOSPITAL_COMMUNITY)
Admission: EM | Admit: 2018-11-30 | Discharge: 2018-11-30 | Disposition: A | Discharge status: Home / Self Care | Payer: Medicaid Other | Attending: Emergency Medicine | Admitting: Emergency Medicine

## 2018-11-30 ENCOUNTER — Other Ambulatory Visit: Payer: Self-pay

## 2018-11-30 ENCOUNTER — Encounter (HOSPITAL_COMMUNITY): Payer: Self-pay

## 2018-11-30 DIAGNOSIS — M25572 Pain in left ankle and joints of left foot: Secondary | ICD-10-CM | POA: Diagnosis not present

## 2018-11-30 NOTE — Discharge Instructions (Addendum)
Rest and elevate your foot.  Apply ice packs 2-3 times a day for up to 20 minutes each.  Wear the Ace wrap as needed for comfort.    Follow up with your OB/GYN or an orthopedist if you symptoms continue or worsen;  Or if you develop new symptoms, such as numbness, tingling, or weakness.

## 2018-11-30 NOTE — ED Triage Notes (Signed)
Pt presents with left ankle pain and swelling not related to any injury.

## 2018-11-30 NOTE — ED Provider Notes (Signed)
[place]    CSN: [id number] Arrival date & time: 11/30/18  1507      History   Chief Complaint Chief Complaint  Patient presents with  . Ankle Pain    HPI Kristina Alvarado is a 28 y.o. female.   Patient presents with left ankle pain and swelling for 3 days.  She denies falls, twists, or injury.  She denies numbness, weakness, paresthesias.  She is currently 6 months pregnant.  The history is provided by the patient.    Past Medical History:  Diagnosis Date  . Anemia     Patient Active Problem List   Diagnosis Date Noted  . Abnormal TSH 08/18/2018  . Anemia in pregnancy 08/18/2018  . Supervision of low-risk pregnancy 07/21/2018  . Visit for routine gyn exam 12/17/2017  . Chlamydia 10/08/2017    Past Surgical History:  Procedure Laterality Date  . DIAGNOSTIC LAPAROSCOPY WITH REMOVAL OF ECTOPIC PREGNANCY N/A 09/10/2017   Procedure: DIAGNOSTIC LAPAROSCOPY WITH REMOVAL OF ECTOPIC PREGNANCY RIGHT SALPENGECTOMY RIGHT;  Surgeon: Osborne Oman, MD;  Location: [facility];  Service: Gynecology;  Laterality: N/A;    OB History    Gravida  2   Para      Term      Preterm      AB  1   Living        SAB      TAB      Ectopic  1   Multiple      Live Births               Home Medications    Prior to Admission medications   Medication Sig Start Date End Date Taking? Authorizing Provider  AMBULATORY NON FORMULARY MEDICATION 1 Device by Other route once a week. Blood Pressure Cuff Large Monitored Regularly at home ICD 10:Z34.90 08/16/18   Clovia Cuff C, MD  docusate sodium (COLACE) 100 MG capsule Take 1 capsule (100 mg total) by mouth every 12 (twelve) hours. Patient not taking: Reported on 07/21/2018 07/20/18   Starr Lake, CNM  metroNIDAZOLE (FLAGYL) 500 MG tablet Take 1 tablet (500 mg total) by mouth 2 (two) times daily. Patient not taking: Reported on 10/11/2018 09/29/18   [name], Sallyanne Havers, MD  ondansetron (ZOFRAN) 8 MG tablet  Take 1 tablet (8 mg total) by mouth every 8 (eight) hours as needed for nausea or vomiting. Patient not taking: Reported on 10/11/2018 07/20/18   Starr Lake, CNM  Prenatal Vit-Fe Fumarate-FA (PRENATAL MULTIVITAMIN) TABS tablet Take 1 tablet by mouth daily at 12 noon.    [provider]  promethazine (PHENERGAN) 25 MG suppository Place one suppository vaginally at night. WIll cause drowsiness. Patient not taking: Reported on 07/21/2018 07/20/18   Starr Lake, CNM  terconazole (TERAZOL 3) 0.8 % vaginal cream Place 1 applicator vaginally at bedtime. Apply nightly for three nights. Patient not taking: Reported on 10/11/2018 09/29/18   Osborne Oman, MD    Family History Family History  Problem Relation Age of Onset  . Asthma Mother   . Hypertension Mother   . Diabetes Mother        takes insulin  . Asthma Father   . Heart disease Maternal Aunt   . Heart disease Maternal Grandmother     Social History Social History   Tobacco Use  . Smoking status: Former Smoker    Types: Cigars    Quit date: 07/07/2018    Years since quitting: 0.4  .  Smokeless tobacco: Never Used  . Tobacco comment: 1/d  Substance Use Topics  . Alcohol use: Not Currently    Comment: not since pregnancy  . Drug use: Never     Allergies   Patient has no known allergies.   Review of Systems Review of Systems  Constitutional: Negative for chills and fever.  HENT: Negative for ear pain and sore throat.   Eyes: Negative for pain and visual disturbance.  Respiratory: Negative for cough and shortness of breath.   Cardiovascular: Negative for chest pain and palpitations.  Gastrointestinal: Negative for abdominal pain and vomiting.  Genitourinary: Negative for dysuria and hematuria.  Musculoskeletal: Positive for arthralgias and joint swelling. Negative for back pain.  Skin: Negative for color change and rash.  Neurological: Negative for seizures, syncope, weakness and  numbness.  All other systems reviewed and are negative.    Physical Exam Triage Vital Signs ED Triage Vitals  Enc Vitals Group     BP 11/30/18 1534 (!) 92/58     Pulse Rate 11/30/18 1534 80     Resp 11/30/18 1534 17     Temp 11/30/18 1534 98.2 F (36.8 C)     Temp Source 11/30/18 1534 Oral     SpO2 11/30/18 1534 100 %     Weight --      Height --      Head Circumference --      Peak Flow --      Pain Score 11/30/18 1555 8     Pain Loc --      Pain Edu? --      Excl. in GC? --    No data found.  Updated Vital Signs BP (!) 92/58 (BP Location: Left Arm)   Pulse 80   Temp 98.2 F (36.8 C) (Oral)   Resp 17   LMP 05/21/2018 (Exact Date)   SpO2 100%   Visual Acuity Right Eye Distance:   Left Eye Distance:   Bilateral Distance:    Right Eye Near:   Left Eye Near:    Bilateral Near:     Physical Exam Vitals signs and nursing note reviewed.  Constitutional:      General: She is not in acute distress.    Appearance: She is well-developed.  HENT:     Head: Normocephalic and atraumatic.     Mouth/Throat:     Mouth: Mucous membranes are moist.  Eyes:     Conjunctiva/sclera: Conjunctivae normal.  Neck:     Musculoskeletal: Neck supple.  Cardiovascular:     Rate and Rhythm: Normal rate and regular rhythm.     Heart sounds: No murmur.  Pulmonary:     Effort: Pulmonary effort is normal. No respiratory distress.     Breath sounds: Normal breath sounds.  Abdominal:     Palpations: Abdomen is soft.     Tenderness: There is no abdominal tenderness.  Musculoskeletal: Normal range of motion.        General: Swelling and tenderness present. No deformity.       Feet:  Skin:    General: Skin is warm and dry.     Capillary Refill: Capillary refill takes less than 2 seconds.     Findings: No bruising, erythema, lesion or rash.  Neurological:     General: No focal deficit present.     Mental Status: She is alert and oriented to person, place, and time.     Sensory: No  sensory deficit.     Motor: No weakness.  Gait: Gait abnormal.      UC Treatments / Results  Labs (all labs ordered are listed, but only abnormal results are displayed) Labs Reviewed - No data to display  EKG   Radiology No results found.  Procedures Procedures (including critical care time)  Medications Ordered in UC Medications - No data to display  Initial Impression / Assessment and Plan / UC Course  I have reviewed the triage vital signs and the nursing notes.  Pertinent labs & imaging results that were available during my care of the patient were reviewed by me and considered in my medical decision making (see chart for details).    Acute left ankle pain and swelling.  No known injury.  Patient is 6 months pregnant.  Treating with Ace wrap, ice packs, rest, elevation.  Instructed patient to follow-up with her OB/GYN or an orthopedist if her symptoms continue or worsen or she develops new symptoms such as weakness, numbness, paresthesias.  Patient agrees to plan of care.     Final Clinical Impressions(s) / UC Diagnoses   Final diagnoses:  Acute left ankle pain     Discharge Instructions     Rest and elevate your foot.  Apply ice packs 2-3 times a day for up to 20 minutes each.  Wear the Ace wrap as needed for comfort.    Follow up with your OB/GYN or an orthopedist if you symptoms continue or worsen;  Or if you develop new symptoms, such as numbness, tingling, or weakness.         ED Prescriptions    None     PDMP not reviewed this encounter.   Mickie Bailate, Kimmy Parish H, NP 11/30/18 [phone]

## 2018-12-03 DIAGNOSIS — M25572 Pain in left ankle and joints of left foot: Secondary | ICD-10-CM | POA: Diagnosis not present

## 2018-12-09 ENCOUNTER — Ambulatory Visit (HOSPITAL_COMMUNITY)
Admission: RE | Admit: 2018-12-09 | Discharge: 2018-12-09 | Disposition: A | Discharge status: Home / Self Care | Payer: Medicaid Other | Source: Ambulatory Visit | Attending: Student | Admitting: Student

## 2018-12-09 ENCOUNTER — Encounter (HOSPITAL_COMMUNITY): Payer: Self-pay | Admitting: *Deleted

## 2018-12-09 ENCOUNTER — Other Ambulatory Visit (HOSPITAL_COMMUNITY): Payer: Self-pay | Admitting: *Deleted

## 2018-12-09 ENCOUNTER — Other Ambulatory Visit: Payer: Self-pay

## 2018-12-09 ENCOUNTER — Ambulatory Visit (HOSPITAL_COMMUNITY): Payer: Medicaid Other | Admitting: *Deleted

## 2018-12-09 DIAGNOSIS — Z3490 Encounter for supervision of normal pregnancy, unspecified, unspecified trimester: Secondary | ICD-10-CM | POA: Diagnosis present

## 2018-12-09 DIAGNOSIS — R7989 Other specified abnormal findings of blood chemistry: Secondary | ICD-10-CM | POA: Diagnosis present

## 2018-12-09 DIAGNOSIS — Z3A27 27 weeks gestation of pregnancy: Secondary | ICD-10-CM

## 2018-12-09 DIAGNOSIS — R9389 Abnormal findings on diagnostic imaging of other specified body structures: Secondary | ICD-10-CM | POA: Diagnosis present

## 2018-12-09 DIAGNOSIS — O99012 Anemia complicating pregnancy, second trimester: Secondary | ICD-10-CM | POA: Diagnosis not present

## 2018-12-09 DIAGNOSIS — O99212 Obesity complicating pregnancy, second trimester: Secondary | ICD-10-CM

## 2018-12-09 DIAGNOSIS — O365990 Maternal care for other known or suspected poor fetal growth, unspecified trimester, not applicable or unspecified: Secondary | ICD-10-CM | POA: Diagnosis not present

## 2018-12-09 DIAGNOSIS — O365920 Maternal care for other known or suspected poor fetal growth, second trimester, not applicable or unspecified: Secondary | ICD-10-CM | POA: Diagnosis not present

## 2018-12-09 NOTE — Procedures (Signed)
Kristina Alvarado 11-Sep-1990 102w0d  Fetus A Non-Stress Test Interpretation for 12/09/18  Indication: IUGR, Unsatisfactory dopplers  Fetal Heart Rate A Mode: External Baseline Rate (A): 150 bpm Variability: Moderate Accelerations: 10 x 10 Decelerations: Variable Multiple birth?: No  Uterine Activity Mode: Palpation, Toco Contraction Frequency (min): None Resting Tone Palpated: Relaxed Resting Time: Adequate  Interpretation (Fetal Testing) Nonstress Test Interpretation: Reactive Overall Impression: Reassuring for gestational age Comments: EFM tracing reviewed by Dr. Donalee Citrin

## 2018-12-16 ENCOUNTER — Other Ambulatory Visit: Payer: Self-pay | Admitting: Lactation Services

## 2018-12-16 DIAGNOSIS — Z3490 Encounter for supervision of normal pregnancy, unspecified, unspecified trimester: Secondary | ICD-10-CM

## 2018-12-17 ENCOUNTER — Ambulatory Visit (HOSPITAL_COMMUNITY): Payer: Medicaid Other | Admitting: *Deleted

## 2018-12-17 ENCOUNTER — Ambulatory Visit (HOSPITAL_COMMUNITY)
Admission: RE | Admit: 2018-12-17 | Discharge: 2018-12-17 | Disposition: A | Discharge status: Home / Self Care | Payer: Medicaid Other | Source: Ambulatory Visit | Attending: Obstetrics and Gynecology | Admitting: Obstetrics and Gynecology

## 2018-12-17 ENCOUNTER — Encounter (HOSPITAL_COMMUNITY): Payer: Self-pay

## 2018-12-17 ENCOUNTER — Other Ambulatory Visit: Payer: Self-pay

## 2018-12-17 DIAGNOSIS — R7989 Other specified abnormal findings of blood chemistry: Secondary | ICD-10-CM

## 2018-12-17 DIAGNOSIS — O365930 Maternal care for other known or suspected poor fetal growth, third trimester, not applicable or unspecified: Secondary | ICD-10-CM | POA: Diagnosis not present

## 2018-12-17 DIAGNOSIS — Z3A28 28 weeks gestation of pregnancy: Secondary | ICD-10-CM | POA: Diagnosis not present

## 2018-12-17 DIAGNOSIS — O365920 Maternal care for other known or suspected poor fetal growth, second trimester, not applicable or unspecified: Secondary | ICD-10-CM

## 2018-12-17 DIAGNOSIS — O365990 Maternal care for other known or suspected poor fetal growth, unspecified trimester, not applicable or unspecified: Secondary | ICD-10-CM | POA: Insufficient documentation

## 2018-12-17 DIAGNOSIS — O99212 Obesity complicating pregnancy, second trimester: Secondary | ICD-10-CM | POA: Diagnosis not present

## 2018-12-17 NOTE — Procedures (Signed)
Kristina Alvarado [birth year] [redacted]w[redacted]d  Fetus A Non-Stress Test Interpretation for 12/17/18  Indication: IUGR  Fetal Heart Rate A Mode: External Baseline Rate (A): 145 bpm Variability: Moderate Accelerations: 10 x 10 Decelerations: None Multiple birth?: No  Uterine Activity Mode: Toco Contraction Frequency (min): none noted  Interpretation (Fetal Testing) Nonstress Test Interpretation: Reactive Comments: FHR tracing rev'd by Dr. Donalee Citrin

## 2018-12-18 DIAGNOSIS — IMO0002 Reserved for concepts with insufficient information to code with codable children: Secondary | ICD-10-CM | POA: Insufficient documentation

## 2018-12-21 ENCOUNTER — Other Ambulatory Visit: Payer: Medicaid Other

## 2018-12-21 ENCOUNTER — Other Ambulatory Visit: Payer: Self-pay

## 2018-12-21 ENCOUNTER — Other Ambulatory Visit (HOSPITAL_COMMUNITY)
Admission: RE | Admit: 2018-12-21 | Discharge: 2018-12-21 | Disposition: A | Discharge status: Home / Self Care | Payer: Medicaid Other | Source: Ambulatory Visit | Attending: Student | Admitting: Student

## 2018-12-21 ENCOUNTER — Ambulatory Visit (INDEPENDENT_AMBULATORY_CARE_PROVIDER_SITE_OTHER): Payer: Medicaid Other | Admitting: Student

## 2018-12-21 VITALS — BP 124/66 | HR 95 | Wt 194.5 lb

## 2018-12-21 DIAGNOSIS — IMO0002 Reserved for concepts with insufficient information to code with codable children: Secondary | ICD-10-CM

## 2018-12-21 DIAGNOSIS — Z3490 Encounter for supervision of normal pregnancy, unspecified, unspecified trimester: Secondary | ICD-10-CM | POA: Diagnosis not present

## 2018-12-21 DIAGNOSIS — N898 Other specified noninflammatory disorders of vagina: Secondary | ICD-10-CM | POA: Insufficient documentation

## 2018-12-21 DIAGNOSIS — Z3A28 28 weeks gestation of pregnancy: Secondary | ICD-10-CM

## 2018-12-21 DIAGNOSIS — Z23 Encounter for immunization: Secondary | ICD-10-CM

## 2018-12-21 DIAGNOSIS — O26893 Other specified pregnancy related conditions, third trimester: Secondary | ICD-10-CM | POA: Diagnosis not present

## 2018-12-21 NOTE — Progress Notes (Signed)
   PRENATAL VISIT NOTE  Subjective:  Kristina Alvarado is a 28 y.o. G2P0010 at [redacted]w[redacted]d being seen today for ongoing prenatal care.  She is currently monitored for the following issues for this high-risk pregnancy and has Chlamydia; Visit for routine gyn exam; Supervision of low-risk pregnancy; Abnormal TSH; Anemia in pregnancy; and Poor fetal growth on their problem list.  Patient reports yellow discharge with slight odor and itching; clumpy at times. This has been going on for a week. .  Contractions: Irritability. Vag. Bleeding: None.  Movement: Present. Denies leaking of fluid.   The following portions of the patient's history were reviewed and updated as appropriate: allergies, current medications, past family history, past medical history, past social history, past surgical history and problem list.   Objective:   Vitals:   12/21/18 0825  BP: 124/66  Pulse: 95  Weight: 194 lb 8 oz (88.2 kg)    Fetal Status: Fetal Heart Rate (bpm): 143 Fundal Height: 30 cm Movement: Present     General:  Alert, oriented and cooperative. Patient is in no acute distress.  Skin: Skin is warm and dry. No rash noted.   Cardiovascular: Normal heart rate noted  Respiratory: Normal respiratory effort, no problems with respiration noted  Abdomen: Soft, gravid, appropriate for gestational age.  Pain/Pressure: Absent     Pelvic: Cervical exam deferred        Extremities: Normal range of motion.  Edema: Trace  Mental Status: Normal mood and affect. Normal behavior. Normal judgment and thought content.   Assessment and Plan:  Pregnancy: G2P0010 at [redacted]w[redacted]d 1. Poor fetal growth -Patient will keep her upcoming MFM appts; is not taking BP at home because she gets it checked weekly at [facility].   2. Encounter for supervision of low-risk pregnancy, antepartum -Patient forgot to fast, she will do her labs and 28 weeks labs on Friday.  -will do self swab today.  - Tdap vaccine greater than or equal to 7yo IM - Flu  Vaccine QUAD 36+ mos IM  Preterm labor symptoms and general obstetric precautions including but not limited to vaginal bleeding, contractions, leaking of fluid and fetal movement were reviewed in detail with the patient. Please refer to After Visit Summary for other counseling recommendations.   Return in about 4 weeks (around 01/18/2019), or LR[facility] MY Chart visit.  Future Appointments  Date Time Provider [place]  12/24/2018  8:50 AM WOC-WOCA LAB WOC-WOCA WOC  12/24/2018 10:45 AM WH-MFC NST [place] MFC-US  12/24/2018 11:00 AM WH-MFC NURSE WH-MFC MFC-US  12/24/2018 11:00 AM [place] 3 WH-MFCUS MFC-US  12/31/2018 10:45 AM WH-MFC NST [place] MFC-US  12/31/2018 11:00 AM WH-MFC NURSE WH-MFC MFC-US  12/31/2018 11:00 AM WH-MFC [place] 3 WH-MFCUS MFC-US    Kathryn Lorraine Kooistra, CNM

## 2018-12-21 NOTE — Patient Instructions (Signed)

## 2018-12-22 ENCOUNTER — Other Ambulatory Visit: Payer: Self-pay | Admitting: Student

## 2018-12-22 LAB — CERVICOVAGINAL ANCILLARY ONLY
Bacterial Vaginitis (gardnerella): NEGATIVE
Candida Glabrata: NEGATIVE
Candida Vaginitis: POSITIVE — AB
Chlamydia: NEGATIVE
Comment: NEGATIVE
Comment: NEGATIVE
Comment: NEGATIVE
Comment: NEGATIVE
Comment: NEGATIVE
Comment: NORMAL
Neisseria Gonorrhea: NEGATIVE
Trichomonas: NEGATIVE

## 2018-12-22 MED ORDER — TERCONAZOLE 0.4 % VA CREA: 1.0000 | TOPICAL_CREAM | Freq: Every day | VAGINAL | 0 refills | Status: DC

## 2018-12-24 ENCOUNTER — Ambulatory Visit (HOSPITAL_COMMUNITY): Payer: Medicaid Other | Admitting: *Deleted

## 2018-12-24 ENCOUNTER — Other Ambulatory Visit: Payer: Self-pay

## 2018-12-24 ENCOUNTER — Ambulatory Visit (HOSPITAL_COMMUNITY)
Admission: RE | Admit: 2018-12-24 | Discharge: 2018-12-24 | Disposition: A | Discharge status: Home / Self Care | Payer: Medicaid Other | Source: Ambulatory Visit | Attending: Obstetrics and Gynecology | Admitting: Obstetrics and Gynecology

## 2018-12-24 ENCOUNTER — Other Ambulatory Visit (HOSPITAL_COMMUNITY): Payer: Self-pay | Admitting: *Deleted

## 2018-12-24 ENCOUNTER — Other Ambulatory Visit: Payer: Medicaid Other

## 2018-12-24 ENCOUNTER — Encounter (HOSPITAL_COMMUNITY): Payer: Self-pay

## 2018-12-24 VITALS — BP 122/70 | HR 88 | Temp 98.7°F

## 2018-12-24 DIAGNOSIS — O99213 Obesity complicating pregnancy, third trimester: Secondary | ICD-10-CM

## 2018-12-24 DIAGNOSIS — Z3A29 29 weeks gestation of pregnancy: Secondary | ICD-10-CM

## 2018-12-24 DIAGNOSIS — Z3490 Encounter for supervision of normal pregnancy, unspecified, unspecified trimester: Secondary | ICD-10-CM

## 2018-12-24 DIAGNOSIS — O365990 Maternal care for other known or suspected poor fetal growth, unspecified trimester, not applicable or unspecified: Secondary | ICD-10-CM | POA: Diagnosis not present

## 2018-12-24 DIAGNOSIS — O365930 Maternal care for other known or suspected poor fetal growth, third trimester, not applicable or unspecified: Secondary | ICD-10-CM | POA: Diagnosis not present

## 2018-12-24 DIAGNOSIS — R7989 Other specified abnormal findings of blood chemistry: Secondary | ICD-10-CM | POA: Insufficient documentation

## 2018-12-24 DIAGNOSIS — IMO0002 Reserved for concepts with insufficient information to code with codable children: Secondary | ICD-10-CM

## 2018-12-24 NOTE — Procedures (Signed)
Kristina Alvarado 06-20-1990 [redacted]w[redacted]d  Fetus A Non-Stress Test Interpretation for 12/24/18  Indication: IUGR  Fetal Heart Rate A Mode: External Baseline Rate (A): 145 bpm Variability: Moderate Accelerations: 10 x 10 Decelerations: None Multiple birth?: No  Uterine Activity Mode: Palpation, Toco Contraction Frequency (min): none Resting Tone Palpated: Relaxed Resting Time: Adequate  Interpretation (Fetal Testing) Nonstress Test Interpretation: Reactive Overall Impression: Reassuring for gestational age Comments: FHR tracing reviewed by Dr. Donalee Citrin

## 2018-12-25 LAB — CBC
Hematocrit: 29 % — ABNORMAL LOW (ref 34.0–46.6)
Hemoglobin: 9.9 g/dL — ABNORMAL LOW (ref 11.1–15.9)
MCH: 29.6 pg (ref 26.6–33.0)
MCHC: 34.1 g/dL (ref 31.5–35.7)
MCV: 87 fL (ref 79–97)
Platelets: 218 10*3/uL (ref 150–450)
RBC: 3.34 x10E6/uL — ABNORMAL LOW (ref 3.77–5.28)
RDW: 13 % (ref 11.7–15.4)
WBC: 7.7 10*3/uL (ref 3.4–10.8)

## 2018-12-25 LAB — GLUCOSE TOLERANCE, 2 HOURS W/ 1HR
Glucose, 1 hour: 132 mg/dL (ref 65–179)
Glucose, 2 hour: 97 mg/dL (ref 65–152)
Glucose, Fasting: 62 mg/dL — ABNORMAL LOW (ref 65–91)

## 2018-12-25 LAB — HIV ANTIBODY (ROUTINE TESTING W REFLEX): HIV Screen 4th Generation wRfx: NONREACTIVE

## 2018-12-25 LAB — RPR: RPR Ser Ql: NONREACTIVE

## 2018-12-31 ENCOUNTER — Other Ambulatory Visit: Payer: Self-pay

## 2018-12-31 ENCOUNTER — Ambulatory Visit (HOSPITAL_COMMUNITY): Payer: Medicaid Other | Admitting: *Deleted

## 2018-12-31 ENCOUNTER — Ambulatory Visit (HOSPITAL_COMMUNITY)
Admission: RE | Admit: 2018-12-31 | Discharge: 2018-12-31 | Disposition: A | Discharge status: Home / Self Care | Payer: Medicaid Other | Source: Ambulatory Visit | Attending: Obstetrics and Gynecology | Admitting: Obstetrics and Gynecology

## 2018-12-31 ENCOUNTER — Encounter (HOSPITAL_COMMUNITY): Payer: Self-pay

## 2018-12-31 DIAGNOSIS — R7989 Other specified abnormal findings of blood chemistry: Secondary | ICD-10-CM | POA: Diagnosis present

## 2018-12-31 DIAGNOSIS — Z362 Encounter for other antenatal screening follow-up: Secondary | ICD-10-CM

## 2018-12-31 DIAGNOSIS — Z3A30 30 weeks gestation of pregnancy: Secondary | ICD-10-CM | POA: Diagnosis not present

## 2018-12-31 DIAGNOSIS — O365990 Maternal care for other known or suspected poor fetal growth, unspecified trimester, not applicable or unspecified: Secondary | ICD-10-CM | POA: Insufficient documentation

## 2018-12-31 DIAGNOSIS — O365930 Maternal care for other known or suspected poor fetal growth, third trimester, not applicable or unspecified: Secondary | ICD-10-CM | POA: Diagnosis present

## 2018-12-31 DIAGNOSIS — O365931 Maternal care for other known or suspected poor fetal growth, third trimester, fetus 1: Secondary | ICD-10-CM | POA: Diagnosis not present

## 2018-12-31 DIAGNOSIS — O99213 Obesity complicating pregnancy, third trimester: Secondary | ICD-10-CM

## 2018-12-31 NOTE — Procedures (Signed)
Kristina Alvarado 07-15-1990 [redacted]w[redacted]d  Fetus A Non-Stress Test Interpretation for 12/31/18  Indication: IUGR  Fetal Heart Rate A Mode: External Baseline Rate (A): 150 bpm Variability: Moderate Accelerations: 10 x 10 Decelerations: None Multiple birth?: No  Uterine Activity Mode: Palpation, Toco Contraction Frequency (min): none Resting Tone Palpated: Relaxed Resting Time: Adequate  Interpretation (Fetal Testing) Nonstress Test Interpretation: Reactive Overall Impression: Reassuring for gestational age Comments: FHR tracing reviewed with Dr. Annamaria Boots

## 2019-01-04 ENCOUNTER — Encounter (HOSPITAL_COMMUNITY): Payer: Self-pay

## 2019-01-04 ENCOUNTER — Ambulatory Visit (HOSPITAL_COMMUNITY)
Admission: RE | Admit: 2019-01-04 | Discharge: 2019-01-04 | Disposition: A | Discharge status: Home / Self Care | Payer: Medicaid Other | Source: Ambulatory Visit | Attending: Obstetrics and Gynecology | Admitting: Obstetrics and Gynecology

## 2019-01-04 ENCOUNTER — Other Ambulatory Visit: Payer: Self-pay

## 2019-01-04 ENCOUNTER — Ambulatory Visit (HOSPITAL_COMMUNITY): Payer: Medicaid Other | Admitting: *Deleted

## 2019-01-04 VITALS — BP 107/70 | HR 99 | Temp 98.6°F

## 2019-01-04 DIAGNOSIS — O99213 Obesity complicating pregnancy, third trimester: Secondary | ICD-10-CM

## 2019-01-04 DIAGNOSIS — O365990 Maternal care for other known or suspected poor fetal growth, unspecified trimester, not applicable or unspecified: Secondary | ICD-10-CM | POA: Insufficient documentation

## 2019-01-04 DIAGNOSIS — O365931 Maternal care for other known or suspected poor fetal growth, third trimester, fetus 1: Secondary | ICD-10-CM

## 2019-01-04 DIAGNOSIS — O365930 Maternal care for other known or suspected poor fetal growth, third trimester, not applicable or unspecified: Secondary | ICD-10-CM

## 2019-01-04 DIAGNOSIS — R7989 Other specified abnormal findings of blood chemistry: Secondary | ICD-10-CM | POA: Insufficient documentation

## 2019-01-04 DIAGNOSIS — Z3A30 30 weeks gestation of pregnancy: Secondary | ICD-10-CM | POA: Diagnosis not present

## 2019-01-04 NOTE — Procedures (Signed)
Kristina Alvarado 09-Nov-1990 [redacted]w[redacted]d  Fetus A Non-Stress Test Interpretation for 01/04/19  Indication: IUGR  Fetal Heart Rate A Mode: External Baseline Rate (A): 140 bpm Variability: Moderate Accelerations: 15 x 15 Decelerations: None Multiple birth?: No  Uterine Activity Mode: Toco Contraction Frequency (min): none noted  Interpretation (Fetal Testing) Nonstress Test Interpretation: Reactive Comments: FHR tracing rev'd by Dr. Donalee Citrin

## 2019-01-11 ENCOUNTER — Ambulatory Visit (HOSPITAL_COMMUNITY)
Admission: RE | Admit: 2019-01-11 | Discharge: 2019-01-11 | Disposition: A | Discharge status: Home / Self Care | Payer: Medicaid Other | Source: Ambulatory Visit | Attending: Obstetrics and Gynecology | Admitting: Obstetrics and Gynecology

## 2019-01-11 ENCOUNTER — Ambulatory Visit (HOSPITAL_COMMUNITY): Payer: Medicaid Other | Admitting: *Deleted

## 2019-01-11 ENCOUNTER — Other Ambulatory Visit: Payer: Self-pay

## 2019-01-11 ENCOUNTER — Encounter (HOSPITAL_COMMUNITY): Payer: Self-pay | Admitting: *Deleted

## 2019-01-11 DIAGNOSIS — O365931 Maternal care for other known or suspected poor fetal growth, third trimester, fetus 1: Secondary | ICD-10-CM | POA: Diagnosis not present

## 2019-01-11 DIAGNOSIS — O365990 Maternal care for other known or suspected poor fetal growth, unspecified trimester, not applicable or unspecified: Secondary | ICD-10-CM | POA: Diagnosis not present

## 2019-01-11 DIAGNOSIS — O99213 Obesity complicating pregnancy, third trimester: Secondary | ICD-10-CM | POA: Diagnosis not present

## 2019-01-11 DIAGNOSIS — Z3A31 31 weeks gestation of pregnancy: Secondary | ICD-10-CM

## 2019-01-11 DIAGNOSIS — Z3490 Encounter for supervision of normal pregnancy, unspecified, unspecified trimester: Secondary | ICD-10-CM | POA: Insufficient documentation

## 2019-01-11 DIAGNOSIS — R7989 Other specified abnormal findings of blood chemistry: Secondary | ICD-10-CM | POA: Insufficient documentation

## 2019-01-11 DIAGNOSIS — O365930 Maternal care for other known or suspected poor fetal growth, third trimester, not applicable or unspecified: Secondary | ICD-10-CM | POA: Insufficient documentation

## 2019-01-11 DIAGNOSIS — O99019 Anemia complicating pregnancy, unspecified trimester: Secondary | ICD-10-CM

## 2019-01-18 ENCOUNTER — Encounter (HOSPITAL_COMMUNITY): Payer: Self-pay | Admitting: *Deleted

## 2019-01-18 ENCOUNTER — Other Ambulatory Visit (HOSPITAL_COMMUNITY): Payer: Self-pay | Admitting: *Deleted

## 2019-01-18 ENCOUNTER — Ambulatory Visit (HOSPITAL_COMMUNITY): Payer: Medicaid Other | Admitting: *Deleted

## 2019-01-18 ENCOUNTER — Ambulatory Visit (HOSPITAL_COMMUNITY)
Admission: RE | Admit: 2019-01-18 | Discharge: 2019-01-18 | Disposition: A | Discharge status: Home / Self Care | Payer: Medicaid Other | Source: Ambulatory Visit | Attending: Obstetrics and Gynecology | Admitting: Obstetrics and Gynecology

## 2019-01-18 ENCOUNTER — Other Ambulatory Visit: Payer: Self-pay

## 2019-01-18 ENCOUNTER — Other Ambulatory Visit (HOSPITAL_COMMUNITY): Payer: Self-pay | Admitting: Obstetrics and Gynecology

## 2019-01-18 DIAGNOSIS — Z3A32 32 weeks gestation of pregnancy: Secondary | ICD-10-CM

## 2019-01-18 DIAGNOSIS — O365930 Maternal care for other known or suspected poor fetal growth, third trimester, not applicable or unspecified: Secondary | ICD-10-CM

## 2019-01-18 DIAGNOSIS — O365990 Maternal care for other known or suspected poor fetal growth, unspecified trimester, not applicable or unspecified: Secondary | ICD-10-CM

## 2019-01-18 DIAGNOSIS — O99213 Obesity complicating pregnancy, third trimester: Secondary | ICD-10-CM | POA: Diagnosis not present

## 2019-01-18 DIAGNOSIS — Z362 Encounter for other antenatal screening follow-up: Secondary | ICD-10-CM | POA: Diagnosis not present

## 2019-01-18 DIAGNOSIS — R7989 Other specified abnormal findings of blood chemistry: Secondary | ICD-10-CM | POA: Diagnosis present

## 2019-01-18 NOTE — Procedures (Signed)
Aala Commons 04-20-1990 [redacted]w[redacted]d  Fetus A Non-Stress Test Interpretation for 01/18/19  Indication: IUGR  Fetal Heart Rate A Mode: External Baseline Rate (A): 145 bpm Variability: Moderate Accelerations: 15 x 15 Decelerations: None Multiple birth?: No  Uterine Activity Mode: Toco Contraction Frequency (min): none noted  Interpretation (Fetal Testing) Nonstress Test Interpretation: Reactive Comments: FHR tracing rev'd by Dr. Annamaria Boots

## 2019-01-25 ENCOUNTER — Ambulatory Visit (INDEPENDENT_AMBULATORY_CARE_PROVIDER_SITE_OTHER): Payer: Medicaid Other | Admitting: Student

## 2019-01-25 ENCOUNTER — Other Ambulatory Visit: Payer: Self-pay

## 2019-01-25 DIAGNOSIS — Z3493 Encounter for supervision of normal pregnancy, unspecified, third trimester: Secondary | ICD-10-CM

## 2019-01-25 DIAGNOSIS — Z3A33 33 weeks gestation of pregnancy: Secondary | ICD-10-CM

## 2019-01-25 NOTE — Progress Notes (Signed)
I connected with  Kristina Alvarado on 01/25/19 at  1:15 PM EST by telephone and verified that I am speaking with the correct person using two identifiers.   I discussed the limitations, risks, security and privacy concerns of performing an evaluation and management service by telephone and the availability of in person appointments. I also discussed with the patient that there may be a patient responsible charge related to this service. The patient expressed understanding and agreed to proceed.  Bethanne Ginger, CMA 01/25/2019  1:20 PM

## 2019-01-25 NOTE — Progress Notes (Signed)
Patient ID: Kristina Alvarado, female   DOB: 06-24-1990, 28 y.o.   MRN: [id number] I connected with@ on 01/25/19 at  1:15 PM EST by: My Chart  and verified that I am speaking with the correct person using two identifiers.  Patient is located at home and provider is located at [place].     The purpose of this virtual visit is to provide medical care while limiting exposure to the novel coronavirus. I discussed the limitations, risks, security and privacy concerns of performing an evaluation and management service by MyChart and the availability of in person appointments. I also discussed with the patient that there may be a patient responsible charge related to this service. By engaging in this virtual visit, you consent to the provision of healthcare.  Additionally, you authorize for your insurance to be billed for the services provided during this visit.  The patient expressed understanding and agreed to proceed.  The following staff members participated in the virtual visit:  Carver Fila    PRENATAL VISIT NOTE  Subjective:  Kristina Alvarado is a 28 y.o. G2P0010 at [redacted]w[redacted]d  for phone visit for ongoing prenatal care.  She is currently monitored for the following issues for this low-risk pregnancy and has Chlamydia; Visit for routine gyn exam; Supervision of low-risk pregnancy; Abnormal TSH; Anemia in pregnancy; and Poor fetal growth on their problem list.  Patient reports  lots of fetal movement. .  Contractions: Not present. Vag. Bleeding: None.  Movement: Present. Denies leaking of fluid.   The following portions of the patient's history were reviewed and updated as appropriate: allergies, current medications, past family history, past medical history, past social history, past surgical history and problem list.   Objective:   Vitals:   01/25/19 1321  BP: 134/84  Pulse: 94   Self-Obtained  Fetal Status:     Movement: Present     Assessment and Plan:  Pregnancy: G2P0010 at [redacted]w[redacted]d  -Draw TSH  at visit on 12/29 with follow up scan for growth.   -IUGR now resolved and Dopplers reassuring.    -Patient will check BP next week and upload to [facility].   -Patient will bring name of pediatrician to next visit.   Preterm labor symptoms and general obstetric precautions including but not limited to vaginal bleeding, contractions, leaking of fluid and fetal movement were reviewed in detail with the patient.  Return in about 4 weeks (around 02/22/2019), or add lab visit to 12/29 (when patient will be here for a growth scan), then [place] in 4 weeks.  Future Appointments  Date Time Provider [place]  02/08/2019  2:30 PM WH-MFC [place] 1 WH-MFCUS MFC-US  02/08/2019  2:50 PM [place] MFC-US     Time spent on virtual visit: 12 minutes  Starr Lake, CNM

## 2019-01-27 ENCOUNTER — Encounter: Payer: Self-pay | Admitting: Student

## 2019-01-27 ENCOUNTER — Other Ambulatory Visit: Payer: Self-pay | Admitting: Student

## 2019-01-27 DIAGNOSIS — D649 Anemia, unspecified: Secondary | ICD-10-CM | POA: Insufficient documentation

## 2019-01-27 MED ORDER — FERROUS SULFATE 324 (65 FE) MG PO TBEC: 1.0000 | DELAYED_RELEASE_TABLET | Freq: Every day | ORAL | 1 refills | Status: DC

## 2019-02-02 ENCOUNTER — Telehealth: Payer: Self-pay

## 2019-02-02 NOTE — Telephone Encounter (Signed)
Call received from [employer] with elevated BP alert; BP 132/91 at 2041 02/01/19.  Per [employer] review, BP rechecked by pt at 2132 02/01/19 and was 127/79.  Pt called; VM left stating I was calling to follow up on [employer] alert. Pt asked to call back or check her MyChart for my message.

## 2019-02-08 ENCOUNTER — Ambulatory Visit (HOSPITAL_COMMUNITY): Payer: Medicaid Other

## 2019-02-08 ENCOUNTER — Ambulatory Visit (HOSPITAL_COMMUNITY): Payer: Medicaid Other | Admitting: *Deleted

## 2019-02-08 ENCOUNTER — Other Ambulatory Visit (HOSPITAL_COMMUNITY): Payer: Self-pay | Admitting: *Deleted

## 2019-02-08 ENCOUNTER — Other Ambulatory Visit: Payer: Self-pay | Admitting: Student

## 2019-02-08 ENCOUNTER — Encounter (HOSPITAL_COMMUNITY): Payer: Self-pay | Admitting: *Deleted

## 2019-02-08 ENCOUNTER — Ambulatory Visit (HOSPITAL_COMMUNITY)
Admission: RE | Admit: 2019-02-08 | Discharge: 2019-02-08 | Disposition: A | Discharge status: Home / Self Care | Payer: Medicaid Other | Source: Ambulatory Visit | Attending: Obstetrics and Gynecology | Admitting: Obstetrics and Gynecology

## 2019-02-08 ENCOUNTER — Other Ambulatory Visit: Payer: Self-pay

## 2019-02-08 DIAGNOSIS — R7989 Other specified abnormal findings of blood chemistry: Secondary | ICD-10-CM

## 2019-02-08 DIAGNOSIS — O99019 Anemia complicating pregnancy, unspecified trimester: Secondary | ICD-10-CM | POA: Insufficient documentation

## 2019-02-08 DIAGNOSIS — O365930 Maternal care for other known or suspected poor fetal growth, third trimester, not applicable or unspecified: Secondary | ICD-10-CM | POA: Diagnosis not present

## 2019-02-08 DIAGNOSIS — Z3A35 35 weeks gestation of pregnancy: Secondary | ICD-10-CM | POA: Diagnosis not present

## 2019-02-08 DIAGNOSIS — O99213 Obesity complicating pregnancy, third trimester: Secondary | ICD-10-CM | POA: Diagnosis not present

## 2019-02-08 DIAGNOSIS — Z362 Encounter for other antenatal screening follow-up: Secondary | ICD-10-CM | POA: Diagnosis not present

## 2019-02-08 DIAGNOSIS — Z6841 Body Mass Index (BMI) 40.0 and over, adult: Secondary | ICD-10-CM

## 2019-02-10 LAB — TSH: TSH: 0.782 u[IU]/mL (ref 0.450–4.500)

## 2019-02-16 ENCOUNTER — Other Ambulatory Visit: Payer: Self-pay

## 2019-02-16 ENCOUNTER — Inpatient Hospital Stay (HOSPITAL_COMMUNITY)
Admission: AD | Admit: 2019-02-16 | Discharge: 2019-02-20 | DRG: 788 | Disposition: A | Discharge status: Home / Self Care | Payer: Medicaid Other | Attending: Obstetrics and Gynecology | Admitting: Obstetrics and Gynecology

## 2019-02-16 ENCOUNTER — Encounter (HOSPITAL_COMMUNITY): Payer: Self-pay | Admitting: Family Medicine

## 2019-02-16 DIAGNOSIS — O1404 Mild to moderate pre-eclampsia, complicating childbirth: Secondary | ICD-10-CM | POA: Diagnosis present

## 2019-02-16 DIAGNOSIS — O99824 Streptococcus B carrier state complicating childbirth: Secondary | ICD-10-CM | POA: Diagnosis not present

## 2019-02-16 DIAGNOSIS — Z3A00 Weeks of gestation of pregnancy not specified: Secondary | ICD-10-CM | POA: Diagnosis not present

## 2019-02-16 DIAGNOSIS — D649 Anemia, unspecified: Secondary | ICD-10-CM | POA: Diagnosis present

## 2019-02-16 DIAGNOSIS — Z20822 Contact with and (suspected) exposure to covid-19: Secondary | ICD-10-CM | POA: Diagnosis present

## 2019-02-16 DIAGNOSIS — Z87891 Personal history of nicotine dependence: Secondary | ICD-10-CM | POA: Diagnosis not present

## 2019-02-16 DIAGNOSIS — Z3493 Encounter for supervision of normal pregnancy, unspecified, third trimester: Secondary | ICD-10-CM

## 2019-02-16 DIAGNOSIS — O4292 Full-term premature rupture of membranes, unspecified as to length of time between rupture and onset of labor: Secondary | ICD-10-CM | POA: Diagnosis present

## 2019-02-16 DIAGNOSIS — O99214 Obesity complicating childbirth: Secondary | ICD-10-CM | POA: Diagnosis present

## 2019-02-16 DIAGNOSIS — O1490 Unspecified pre-eclampsia, unspecified trimester: Secondary | ICD-10-CM

## 2019-02-16 DIAGNOSIS — Z3A37 37 weeks gestation of pregnancy: Secondary | ICD-10-CM | POA: Diagnosis not present

## 2019-02-16 DIAGNOSIS — Z30017 Encounter for initial prescription of implantable subdermal contraceptive: Secondary | ICD-10-CM | POA: Diagnosis not present

## 2019-02-16 DIAGNOSIS — Z3A36 36 weeks gestation of pregnancy: Secondary | ICD-10-CM | POA: Diagnosis not present

## 2019-02-16 DIAGNOSIS — O9902 Anemia complicating childbirth: Secondary | ICD-10-CM | POA: Diagnosis not present

## 2019-02-16 DIAGNOSIS — O42919 Preterm premature rupture of membranes, unspecified as to length of time between rupture and onset of labor, unspecified trimester: Secondary | ICD-10-CM | POA: Diagnosis present

## 2019-02-16 LAB — CBC
HCT: 31.9 % — ABNORMAL LOW (ref 36.0–46.0)
Hemoglobin: 10.7 g/dL — ABNORMAL LOW (ref 12.0–15.0)
MCH: 30.1 pg (ref 26.0–34.0)
MCHC: 33.5 g/dL (ref 30.0–36.0)
MCV: 89.9 fL (ref 80.0–100.0)
Platelets: 204 10*3/uL (ref 150–400)
RBC: 3.55 MIL/uL — ABNORMAL LOW (ref 3.87–5.11)
RDW: 13.9 % (ref 11.5–15.5)
WBC: 7.3 10*3/uL (ref 4.0–10.5)
nRBC: 0 % (ref 0.0–0.2)

## 2019-02-16 LAB — POCT FERN TEST: POCT Fern Test: POSITIVE

## 2019-02-16 LAB — OB RESULTS CONSOLE GBS: GBS: POSITIVE

## 2019-02-16 LAB — TYPE AND SCREEN
ABO/RH(D): O POS
Antibody Screen: NEGATIVE

## 2019-02-16 LAB — GROUP B STREP BY PCR: Group B strep by PCR: POSITIVE — AB

## 2019-02-16 LAB — SARS CORONAVIRUS 2 (TAT 6-24 HRS): SARS Coronavirus 2: NEGATIVE

## 2019-02-16 MED ORDER — MISOPROSTOL 50MCG HALF TABLET
50.0000 ug | ORAL_TABLET | ORAL | Status: DC | PRN
  Administered 2019-02-16 – 2019-02-17 (×2): 50 ug via BUCCAL
  Filled 2019-02-16 (×2): qty 1

## 2019-02-16 MED ORDER — SODIUM CHLORIDE 0.9 % IV SOLN
5.0000 10*6.[IU] | Freq: Once | INTRAVENOUS | Status: AC
  Administered 2019-02-16: 17:00:00 5 10*6.[IU] via INTRAVENOUS
  Filled 2019-02-16: qty 5

## 2019-02-16 MED ORDER — OXYTOCIN 40 UNITS IN NORMAL SALINE INFUSION - SIMPLE MED
2.5000 [IU]/h | INTRAVENOUS | Status: DC
  Filled 2019-02-16: qty 1000

## 2019-02-16 MED ORDER — FENTANYL CITRATE (PF) 100 MCG/2ML IJ SOLN
100.0000 ug | INTRAMUSCULAR | Status: DC | PRN
  Administered 2019-02-17: 100 ug via INTRAVENOUS
  Filled 2019-02-16 (×2): qty 2

## 2019-02-16 MED ORDER — LACTATED RINGERS IV SOLN: 500.0000 mL | INTRAVENOUS | Status: DC | PRN

## 2019-02-16 MED ORDER — ACETAMINOPHEN 325 MG PO TABS: 650.0000 mg | ORAL_TABLET | ORAL | Status: DC | PRN

## 2019-02-16 MED ORDER — OXYTOCIN BOLUS FROM INFUSION: 500.0000 mL | Freq: Once | INTRAVENOUS | Status: DC

## 2019-02-16 MED ORDER — SOD CITRATE-CITRIC ACID 500-334 MG/5ML PO SOLN
30.0000 mL | ORAL | Status: DC | PRN
  Administered 2019-02-17: 30 mL via ORAL
  Filled 2019-02-16: qty 30

## 2019-02-16 MED ORDER — ONDANSETRON HCL 4 MG/2ML IJ SOLN
4.0000 mg | Freq: Four times a day (QID) | INTRAMUSCULAR | Status: DC | PRN
  Administered 2019-02-17: 4 mg via INTRAVENOUS
  Filled 2019-02-16: qty 2

## 2019-02-16 MED ORDER — OXYCODONE-ACETAMINOPHEN 5-325 MG PO TABS: 2.0000 | ORAL_TABLET | ORAL | Status: DC | PRN

## 2019-02-16 MED ORDER — LACTATED RINGERS IV SOLN: INTRAVENOUS | Status: DC

## 2019-02-16 MED ORDER — OXYCODONE-ACETAMINOPHEN 5-325 MG PO TABS: 1.0000 | ORAL_TABLET | ORAL | Status: DC | PRN

## 2019-02-16 MED ORDER — LIDOCAINE HCL (PF) 1 % IJ SOLN: 30.0000 mL | INTRAMUSCULAR | Status: DC | PRN

## 2019-02-16 MED ORDER — PENICILLIN G POT IN DEXTROSE 60000 UNIT/ML IV SOLN
3.0000 10*6.[IU] | INTRAVENOUS | Status: DC
  Administered 2019-02-16 – 2019-02-17 (×5): 3 10*6.[IU] via INTRAVENOUS
  Filled 2019-02-16 (×5): qty 50

## 2019-02-16 MED ORDER — TERBUTALINE SULFATE 1 MG/ML IJ SOLN
0.2500 mg | Freq: Once | INTRAMUSCULAR | Status: DC | PRN
  Filled 2019-02-16: qty 1

## 2019-02-16 NOTE — Progress Notes (Signed)
Kristina Alvarado is a 29 y.o. G2P0010 at [redacted]w[redacted]d by early [place] presenting for SROM.  Subjective: Doing well. No complaints.   Objective: BP 128/70   Pulse 87   Temp 98.9 F (37.2 C) (Oral)   Resp 20   Ht 4' 11.5" (1.511 m)   Wt 95.6 kg   LMP 05/21/2018 (Exact Date)   SpO2 99%   BMI 41.84 kg/m  No intake/output data recorded. No intake/output data recorded.  FHT:  FHR: 160 bpm, variability: moderate,  accelerations:  Present,  decelerations:  Absent UC:   irregular, every 2-4 minutes SVE:   Dilation: 1.5 Effacement (%): Thick Exam by:: TLYTLE RN   Labs: Lab Results  Component Value Date   WBC 7.3 02/16/2019   HGB 10.7 (L) 02/16/2019   HCT 31.9 (L) 02/16/2019   MCV 89.9 02/16/2019   PLT 204 02/16/2019    Assessment / Plan: Kristina Alvarado is a 29 yo G2p0010 @ 36+6 by early [place] presenting for SROM.   Labor: Will place FB and cytotec buccal x 1. Patient agreeable.  Fetal Wellbeing:  Category I Pain Control:  per patient request PRN IV fentanyl.  I/D:  GBS +, PCN Anticipated MOD:  NSVD  Kristina Alvarado Plan 02/16/2019, 9:03 PM

## 2019-02-16 NOTE — MAU Note (Signed)
Woke up at 1300, kind of gushed out.  More just keeps coming, clear fluid. No bleeding. Some aching in low back, constant.

## 2019-02-16 NOTE — H&P (Addendum)
OBSTETRIC ADMISSION HISTORY AND PHYSICAL  Kristina Alvarado is a 29 y.o. female G2P0010 with IUP at 56w6dby early [place] for SROM. She reports +FMs, No LOF, no VB, no blurry vision, headaches or peripheral edema, and RUQ pain.  She plans on breast feeding. She requests Nexplanon for birth control. [facility]   Dating: By early [place]--->  Estimated Date of Delivery: 03/10/19  Sono:    '@[redacted]w[redacted]d'$ , CWD, normal appearing anatomy views suboptimal, cephalic presentation, posterior lie, 245g, 21% EFW   Prenatal History/Complications:  Past Medical History: Past Medical History:  Diagnosis Date  . Anemia     Past Surgical History: Past Surgical History:  Procedure Laterality Date  . DIAGNOSTIC LAPAROSCOPY WITH REMOVAL OF ECTOPIC PREGNANCY N/A 09/10/2017   Procedure: DIAGNOSTIC LAPAROSCOPY WITH REMOVAL OF ECTOPIC PREGNANCY RIGHT SALPENGECTOMY RIGHT;  Surgeon: AOsborne Oman MD;  [facility];  Service: Gynecology;  Laterality: N/A;    Obstetrical History: OB History    Gravida  2   Para      Term      Preterm      AB  1   Living        SAB      TAB      Ectopic  1   Multiple      Live Births              Social History: Social History   Socioeconomic History  . Marital status: Single    Spouse name: Not on file  . Number of children: Not on file  . Years of education: Not on file  . Highest education level: Not on file  Occupational History    Comment: [employer]  Tobacco Use  . Smoking status: Former Smoker    Types: Cigars    Quit date: 07/07/2018    Years since quitting: 0.6  . Smokeless tobacco: Never Used  . Tobacco comment: 1/d  Substance and Sexual Activity  . Alcohol use: Not Currently    Comment: not since pregnancy  . Drug use: Never  . Sexual activity: Yes    Birth control/protection: None  Other Topics Concern  . Not on file  Social History Narrative  . Not on file   Social Determinants of  Health   Financial Resource Strain:   . Difficulty of Paying Living Expenses: Not on file  Food Insecurity: No Food Insecurity  . Worried About RCharity fundraiserin the Last Year: Never true  . Ran Out of Food in the Last Year: Never true  Transportation Needs: No Transportation Needs  . Lack of Transportation (Medical): No  . Lack of Transportation (Non-Medical): No  Physical Activity:   . Days of Exercise per Week: Not on file  . Minutes of Exercise per Session: Not on file  Stress:   . Feeling of Stress : Not on file  Social Connections:   . Frequency of Communication with Friends and Family: Not on file  . Frequency of Social Gatherings with Friends and Family: Not on file  . Attends Religious Services: Not on file  . Active Member of Clubs or Organizations: Not on file  . Attends CArchivistMeetings: Not on file  . Marital Status: Not on file    Family History: Family History  Problem Relation Age of Onset  . Asthma Mother   . Hypertension Mother   . Diabetes Mother  takes insulin  . Asthma Father   . Heart disease Maternal Aunt   . Heart disease Maternal Grandmother     Allergies: No Known Allergies  Medications Prior to Admission  Medication Sig Dispense Refill Last Dose  . ferrous sulfate 324 (65 Fe) MG TBEC Take 1 tablet (325 mg total) by mouth daily. 30 tablet 1 02/16/2019 at Unknown time  . Prenatal Vit-Fe Fumarate-FA (PRENATAL MULTIVITAMIN) TABS tablet Take 1 tablet by mouth daily at 12 noon.   02/16/2019 at Unknown time  . AMBULATORY NON FORMULARY MEDICATION 1 Device by Other route once a week. Blood Pressure Cuff Large Monitored Regularly at home ICD 10:Z34.90 (Patient not taking: Reported on 12/24/2018) 1 kit 0   . docusate sodium (COLACE) 100 MG capsule Take 1 capsule (100 mg total) by mouth every 12 (twelve) hours. (Patient not taking: Reported on 07/21/2018) 60 capsule 0   . ondansetron (ZOFRAN) 8 MG tablet Take 1 tablet (8 mg total) by  mouth every 8 (eight) hours as needed for nausea or vomiting. (Patient not taking: Reported on 10/11/2018) 30 tablet 0   . promethazine (PHENERGAN) 25 MG suppository Place one suppository vaginally at night. WIll cause drowsiness. (Patient not taking: Reported on 07/21/2018) 12 each 1      Review of Systems   All systems reviewed and negative except as stated in HPI  Blood pressure 132/78, pulse 88, temperature 98.4 F (36.9 C), temperature source Oral, resp. rate 17, height 4' 11.5" (1.511 m), weight 95.6 kg, last menstrual period 05/21/2018, SpO2 100 %, unknown if currently breastfeeding. General appearance: alert and cooperative Lungs: normal effort Heart: regular rate, normal S1/S2, no murmurs appreciated Abdomen: soft, non-tender; bowel sounds normal Pelvic: gravid uterus GU: No vaginal lesions Extremities: No sign of DVT, no peripheral edema DTR's intact Presentation: cephalic Fetal monitoringBaseline: 140 bpm, Variability: Good {> 6 bpm) and Accelerations: Reactive Uterine activityNone     Prenatal labs: ABO, Rh: O/Positive/-- (07/06 1123) Antibody: Negative (07/06 1123) Rubella: 2.01 (07/06 1123) RPR: Non Reactive (11/13 0927)  HBsAg: Negative (07/06 1123)  HIV: Non Reactive (11/13 0927)  GBS:   Unknown 1 hr Glucola 132 Genetic screening  NIPS, low risk female Anatomy US normal  Prenatal Transfer Tool  Maternal Diabetes: No Genetic Screening: Normal Maternal Ultrasounds/Referrals: Normal Fetal Ultrasounds or other Referrals:  None Maternal Substance Abuse:  No Significant Maternal Medications:  None Significant Maternal Lab Results: None  Results for orders placed or performed during the hospital encounter of 02/16/19 (from the past 24 hour(s))  Fern Test   Collection Time: 02/16/19  3:04 PM  Result Value Ref Range   POCT Fern Test Positive = ruptured amniotic membanes     Patient Active Problem List   Diagnosis Date Noted  . Anemia 01/27/2019  . Poor  fetal growth 12/18/2018  . Abnormal TSH 08/18/2018  . Anemia in pregnancy 08/18/2018  . Supervision of low-risk pregnancy 07/21/2018  . Visit for routine gyn exam 12/17/2017  . Chlamydia 10/08/2017    Assessment/Plan:  Kristina Alvarado is a 29 y.o. G2P0010 at 21w6dhere for SROM.    #Labor: Patient vertex. Patient wants to wait and observe labor progression before considering Cytotec and Foley balloon. #Pain: Per patient's request. #FWB: Leopold's 7 lbs; EFW: 245 g #ID:  GBS positive, PCN #MOF: Breast #MOC: Nexplanon #Circ:  NA, girl  BLeisure centre manager MS3   I confirm that I have verified the information documented in the nurse midwife student's note and that I have also  personally reperformed the history, physical exam and all medical decision making activities of this service and have verified that all service and findings are accurately documented in this student's note.   Marcille Buffy DNP, CNM  02/16/19  6:43 PM

## 2019-02-17 ENCOUNTER — Encounter (HOSPITAL_COMMUNITY): Payer: Self-pay | Admitting: Family Medicine

## 2019-02-17 ENCOUNTER — Inpatient Hospital Stay (HOSPITAL_COMMUNITY): Payer: Medicaid Other | Admitting: Anesthesiology

## 2019-02-17 ENCOUNTER — Encounter (HOSPITAL_COMMUNITY)
Admission: AD | Disposition: A | Discharge status: Home / Self Care | Payer: Self-pay | Source: Home / Self Care | Attending: Obstetrics and Gynecology

## 2019-02-17 DIAGNOSIS — Z3A37 37 weeks gestation of pregnancy: Secondary | ICD-10-CM

## 2019-02-17 DIAGNOSIS — O99824 Streptococcus B carrier state complicating childbirth: Secondary | ICD-10-CM

## 2019-02-17 DIAGNOSIS — O1490 Unspecified pre-eclampsia, unspecified trimester: Secondary | ICD-10-CM

## 2019-02-17 DIAGNOSIS — O42919 Preterm premature rupture of membranes, unspecified as to length of time between rupture and onset of labor, unspecified trimester: Secondary | ICD-10-CM | POA: Diagnosis present

## 2019-02-17 LAB — COMPREHENSIVE METABOLIC PANEL
ALT: 22 U/L (ref 0–44)
AST: 21 U/L (ref 15–41)
Albumin: 2.6 g/dL — ABNORMAL LOW (ref 3.5–5.0)
Alkaline Phosphatase: 121 U/L (ref 38–126)
Anion gap: 10 (ref 5–15)
BUN: 7 mg/dL (ref 6–20)
CO2: 21 mmol/L — ABNORMAL LOW (ref 22–32)
Calcium: 9 mg/dL (ref 8.9–10.3)
Chloride: 102 mmol/L (ref 98–111)
Creatinine, Ser: 0.93 mg/dL (ref 0.44–1.00)
GFR calc Af Amer: >60 mL/min (ref >60)
GFR calc non Af Amer: >60 mL/min (ref >60)
Glucose, Bld: 78 mg/dL (ref 70–99)
Potassium: 4.5 mmol/L (ref 3.5–5.1)
Sodium: 133 mmol/L — ABNORMAL LOW (ref 135–145)
Total Bilirubin: 0.4 mg/dL (ref 0.3–1.2)
Total Protein: 6.5 g/dL (ref 6.5–8.1)

## 2019-02-17 LAB — CBC
HCT: 31.4 % — ABNORMAL LOW (ref 36.0–46.0)
Hemoglobin: 10.1 g/dL — ABNORMAL LOW (ref 12.0–15.0)
MCH: 28.9 pg (ref 26.0–34.0)
MCHC: 32.2 g/dL (ref 30.0–36.0)
MCV: 90 fL (ref 80.0–100.0)
Platelets: 201 10*3/uL (ref 150–400)
RBC: 3.49 MIL/uL — ABNORMAL LOW (ref 3.87–5.11)
RDW: 14 % (ref 11.5–15.5)
WBC: 19.2 10*3/uL — ABNORMAL HIGH (ref 4.0–10.5)
nRBC: 0 % (ref 0.0–0.2)

## 2019-02-17 LAB — PROTEIN / CREATININE RATIO, URINE
Creatinine, Urine: 126.84 mg/dL
Protein Creatinine Ratio: 0.5 mg/mg{Cre} — ABNORMAL HIGH (ref 0.00–0.15)
Total Protein, Urine: 63 mg/dL

## 2019-02-17 SURGERY — Surgical Case
Anesthesia: Epidural | Wound class: Clean Contaminated

## 2019-02-17 MED ORDER — LIDOCAINE HCL (PF) 1 % IJ SOLN
INTRAMUSCULAR | Status: DC | PRN
  Administered 2019-02-17 (×2): 4 mL via EPIDURAL

## 2019-02-17 MED ORDER — ONDANSETRON HCL 4 MG/2ML IJ SOLN
INTRAMUSCULAR | Status: DC | PRN
  Administered 2019-02-17: 4 mg via INTRAVENOUS

## 2019-02-17 MED ORDER — DIPHENHYDRAMINE HCL 25 MG PO CAPS: 25.0000 mg | ORAL_CAPSULE | Freq: Four times a day (QID) | ORAL | Status: DC | PRN

## 2019-02-17 MED ORDER — EPHEDRINE 5 MG/ML INJ: 10.0000 mg | INTRAVENOUS | Status: DC | PRN

## 2019-02-17 MED ORDER — KETOROLAC TROMETHAMINE 30 MG/ML IJ SOLN
30.0000 mg | Freq: Once | INTRAMUSCULAR | Status: AC | PRN
  Administered 2019-02-17: 30 mg via INTRAVENOUS

## 2019-02-17 MED ORDER — CEFAZOLIN SODIUM-DEXTROSE 2-4 GM/100ML-% IV SOLN
2.0000 g | Freq: Once | INTRAVENOUS | Status: AC
  Administered 2019-02-17: 2 g via INTRAVENOUS

## 2019-02-17 MED ORDER — LACTATED RINGERS IV SOLN: INTRAVENOUS | Status: DC

## 2019-02-17 MED ORDER — MORPHINE SULFATE (PF) 0.5 MG/ML IJ SOLN
INTRAMUSCULAR | Status: DC | PRN
  Administered 2019-02-17: 3 mg via EPIDURAL

## 2019-02-17 MED ORDER — MORPHINE SULFATE (PF) 0.5 MG/ML IJ SOLN
INTRAMUSCULAR | Status: AC
  Filled 2019-02-17: qty 10

## 2019-02-17 MED ORDER — FENTANYL CITRATE (PF) 100 MCG/2ML IJ SOLN
INTRAMUSCULAR | Status: AC
  Filled 2019-02-17: qty 2

## 2019-02-17 MED ORDER — SIMETHICONE 80 MG PO CHEW
80.0000 mg | CHEWABLE_TABLET | Freq: Three times a day (TID) | ORAL | Status: DC
  Administered 2019-02-18 – 2019-02-20 (×7): 80 mg via ORAL
  Filled 2019-02-17 (×7): qty 1

## 2019-02-17 MED ORDER — BUPIVACAINE HCL (PF) 0.25 % IJ SOLN
INTRAMUSCULAR | Status: DC | PRN
  Administered 2019-02-17: 8 mL via EPIDURAL

## 2019-02-17 MED ORDER — ZOLPIDEM TARTRATE 5 MG PO TABS: 5.0000 mg | ORAL_TABLET | Freq: Every evening | ORAL | Status: DC | PRN

## 2019-02-17 MED ORDER — CEFAZOLIN SODIUM-DEXTROSE 2-4 GM/100ML-% IV SOLN
INTRAVENOUS | Status: AC
  Filled 2019-02-17: qty 100

## 2019-02-17 MED ORDER — DIBUCAINE (PERIANAL) 1 % EX OINT: 1.0000 "application " | TOPICAL_OINTMENT | CUTANEOUS | Status: DC | PRN

## 2019-02-17 MED ORDER — KETOROLAC TROMETHAMINE 30 MG/ML IJ SOLN
INTRAMUSCULAR | Status: AC
  Filled 2019-02-17: qty 1

## 2019-02-17 MED ORDER — FENTANYL CITRATE (PF) 100 MCG/2ML IJ SOLN
100.0000 ug | Freq: Once | INTRAMUSCULAR | Status: AC
  Administered 2019-02-17: 100 ug via EPIDURAL

## 2019-02-17 MED ORDER — OXYTOCIN 40 UNITS IN NORMAL SALINE INFUSION - SIMPLE MED: 1.0000 m[IU]/min | INTRAVENOUS | Status: DC

## 2019-02-17 MED ORDER — FENTANYL CITRATE (PF) 100 MCG/2ML IJ SOLN
INTRAMUSCULAR | Status: DC | PRN
  Administered 2019-02-17: 100 ug via EPIDURAL

## 2019-02-17 MED ORDER — FENTANYL-BUPIVACAINE-NACL 0.5-0.125-0.9 MG/250ML-% EP SOLN
12.0000 mL/h | EPIDURAL | Status: DC | PRN
  Filled 2019-02-17: qty 250

## 2019-02-17 MED ORDER — PHENYLEPHRINE 40 MCG/ML (10ML) SYRINGE FOR IV PUSH (FOR BLOOD PRESSURE SUPPORT)
80.0000 ug | PREFILLED_SYRINGE | INTRAVENOUS | Status: DC | PRN
  Filled 2019-02-17: qty 10

## 2019-02-17 MED ORDER — LIDOCAINE-EPINEPHRINE (PF) 2 %-1:200000 IJ SOLN
INTRAMUSCULAR | Status: DC | PRN
  Administered 2019-02-17: 5 mL via EPIDURAL
  Administered 2019-02-17: 4 mL via EPIDURAL

## 2019-02-17 MED ORDER — OXYTOCIN 40 UNITS IN NORMAL SALINE INFUSION - SIMPLE MED
INTRAVENOUS | Status: AC
  Filled 2019-02-17: qty 1000

## 2019-02-17 MED ORDER — OXYTOCIN 40 UNITS IN NORMAL SALINE INFUSION - SIMPLE MED
INTRAVENOUS | Status: DC | PRN
  Administered 2019-02-17: 500 mL via INTRAVENOUS

## 2019-02-17 MED ORDER — OXYCODONE HCL 5 MG PO TABS
5.0000 mg | ORAL_TABLET | ORAL | Status: DC | PRN
  Administered 2019-02-19: 03:00:00 5 mg via ORAL
  Filled 2019-02-17: qty 1

## 2019-02-17 MED ORDER — PHENYLEPHRINE 40 MCG/ML (10ML) SYRINGE FOR IV PUSH (FOR BLOOD PRESSURE SUPPORT): 80.0000 ug | PREFILLED_SYRINGE | INTRAVENOUS | Status: DC | PRN

## 2019-02-17 MED ORDER — PHENYLEPHRINE 40 MCG/ML (10ML) SYRINGE FOR IV PUSH (FOR BLOOD PRESSURE SUPPORT)
PREFILLED_SYRINGE | INTRAVENOUS | Status: DC | PRN
  Administered 2019-02-17 (×2): 40 ug via INTRAVENOUS

## 2019-02-17 MED ORDER — TETANUS-DIPHTH-ACELL PERTUSSIS 5-2.5-18.5 LF-MCG/0.5 IM SUSP: 0.5000 mL | Freq: Once | INTRAMUSCULAR | Status: DC

## 2019-02-17 MED ORDER — WITCH HAZEL-GLYCERIN EX PADS: 1.0000 "application " | MEDICATED_PAD | CUTANEOUS | Status: DC | PRN

## 2019-02-17 MED ORDER — IBUPROFEN 800 MG PO TABS
800.0000 mg | ORAL_TABLET | Freq: Three times a day (TID) | ORAL | Status: DC
  Administered 2019-02-18 – 2019-02-20 (×7): 800 mg via ORAL
  Filled 2019-02-17 (×7): qty 1

## 2019-02-17 MED ORDER — COCONUT OIL OIL: 1.0000 "application " | TOPICAL_OIL | Status: DC | PRN

## 2019-02-17 MED ORDER — DIPHENHYDRAMINE HCL 50 MG/ML IJ SOLN: 12.5000 mg | INTRAMUSCULAR | Status: DC | PRN

## 2019-02-17 MED ORDER — ACETAMINOPHEN 325 MG PO TABS
650.0000 mg | ORAL_TABLET | Freq: Four times a day (QID) | ORAL | Status: DC | PRN
  Administered 2019-02-19 (×2): 650 mg via ORAL
  Filled 2019-02-17 (×2): qty 2

## 2019-02-17 MED ORDER — OXYTOCIN 40 UNITS IN NORMAL SALINE INFUSION - SIMPLE MED
2.5000 [IU]/h | INTRAVENOUS | Status: AC
  Administered 2019-02-17: 2.5 [IU]/h via INTRAVENOUS

## 2019-02-17 MED ORDER — MENTHOL 3 MG MT LOZG: 1.0000 | LOZENGE | OROMUCOSAL | Status: DC | PRN

## 2019-02-17 MED ORDER — ENOXAPARIN SODIUM 60 MG/0.6ML ~~LOC~~ SOLN
50.0000 mg | SUBCUTANEOUS | Status: DC
  Administered 2019-02-18 – 2019-02-20 (×3): 50 mg via SUBCUTANEOUS
  Filled 2019-02-17 (×3): qty 0.6

## 2019-02-17 MED ORDER — SENNOSIDES-DOCUSATE SODIUM 8.6-50 MG PO TABS
2.0000 | ORAL_TABLET | ORAL | Status: DC
  Administered 2019-02-17 – 2019-02-18 (×2): 2 via ORAL
  Filled 2019-02-17 (×3): qty 2

## 2019-02-17 MED ORDER — OXYTOCIN 40 UNITS IN NORMAL SALINE INFUSION - SIMPLE MED
1.0000 m[IU]/min | INTRAVENOUS | Status: DC
  Administered 2019-02-17: 14:00:00 2 m[IU]/min via INTRAVENOUS

## 2019-02-17 MED ORDER — SIMETHICONE 80 MG PO CHEW: 80.0000 mg | CHEWABLE_TABLET | ORAL | Status: DC | PRN

## 2019-02-17 MED ORDER — DEXAMETHASONE SODIUM PHOSPHATE 10 MG/ML IJ SOLN
INTRAMUSCULAR | Status: DC | PRN
  Administered 2019-02-17: 10 mg via INTRAVENOUS

## 2019-02-17 MED ORDER — SIMETHICONE 80 MG PO CHEW
80.0000 mg | CHEWABLE_TABLET | ORAL | Status: DC
  Administered 2019-02-17 – 2019-02-19 (×3): 80 mg via ORAL
  Filled 2019-02-17 (×3): qty 1

## 2019-02-17 MED ORDER — LACTATED RINGERS IV SOLN: 500.0000 mL | Freq: Once | INTRAVENOUS | Status: DC

## 2019-02-17 MED ORDER — FENTANYL CITRATE (PF) 100 MCG/2ML IJ SOLN
INTRAMUSCULAR | Status: DC | PRN
  Administered 2019-02-17: 100 ug via INTRAVENOUS

## 2019-02-17 MED ORDER — LACTATED RINGERS IV SOLN
500.0000 mL | Freq: Once | INTRAVENOUS | Status: AC
  Administered 2019-02-17: 500 mL via INTRAVENOUS

## 2019-02-17 MED ORDER — LACTATED RINGERS AMNIOINFUSION: INTRAVENOUS | Status: DC

## 2019-02-17 MED ORDER — TERBUTALINE SULFATE 1 MG/ML IJ SOLN
0.2500 mg | Freq: Once | INTRAMUSCULAR | Status: AC | PRN
  Administered 2019-02-17: 0.25 mg via SUBCUTANEOUS

## 2019-02-17 MED ORDER — PRENATAL MULTIVITAMIN CH
1.0000 | ORAL_TABLET | Freq: Every day | ORAL | Status: DC
  Administered 2019-02-18 – 2019-02-20 (×3): 1 via ORAL
  Filled 2019-02-17 (×3): qty 1

## 2019-02-17 MED ORDER — SODIUM CHLORIDE (PF) 0.9 % IJ SOLN
INTRAMUSCULAR | Status: DC | PRN
  Administered 2019-02-17: 10 mL/h via EPIDURAL

## 2019-02-17 SURGICAL SUPPLY — 36 items
BENZOIN TINCTURE PRP APPL 2/3 (GAUZE/BANDAGES/DRESSINGS) ×3 IMPLANT
CHLORAPREP W/TINT 26ML (MISCELLANEOUS) ×3 IMPLANT
CLAMP CORD UMBIL (MISCELLANEOUS) IMPLANT
DRSG OPSITE POSTOP 4X10 (GAUZE/BANDAGES/DRESSINGS) ×3 IMPLANT
DRSG PAD ABDOMINAL 8X10 ST (GAUZE/BANDAGES/DRESSINGS) ×3 IMPLANT
ELECT REM PT RETURN 9FT ADLT (ELECTROSURGICAL) ×3
ELECTRODE REM PT RTRN 9FT ADLT (ELECTROSURGICAL) ×1 IMPLANT
EXTRACTOR VACUUM M CUP 4 TUBE (SUCTIONS) IMPLANT
EXTRACTOR VACUUM M CUP 4' TUBE (SUCTIONS)
GAUZE SPONGE 4X4 12PLY STRL LF (GAUZE/BANDAGES/DRESSINGS) ×6 IMPLANT
GLOVE BIOGEL PI IND STRL 6.5 (GLOVE) ×1 IMPLANT
GLOVE BIOGEL PI IND STRL 7.0 (GLOVE) ×1 IMPLANT
GLOVE BIOGEL PI INDICATOR 6.5 (GLOVE) ×2
GLOVE BIOGEL PI INDICATOR 7.0 (GLOVE) ×2
GLOVE SURG SS PI 6.0 STRL IVOR (GLOVE) ×3 IMPLANT
GOWN STRL REUS W/TWL LRG LVL3 (GOWN DISPOSABLE) ×6 IMPLANT
KIT ABG SYR 3ML LUER SLIP (SYRINGE) IMPLANT
NEEDLE HYPO 25X5/8 SAFETYGLIDE (NEEDLE) IMPLANT
NS IRRIG 1000ML POUR BTL (IV SOLUTION) ×3 IMPLANT
PACK C SECTION WH (CUSTOM PROCEDURE TRAY) ×3 IMPLANT
PAD ABD 7.5X8 STRL (GAUZE/BANDAGES/DRESSINGS) ×3 IMPLANT
PAD OB MATERNITY 4.3X12.25 (PERSONAL CARE ITEMS) ×3 IMPLANT
PENCIL SMOKE EVAC W/HOLSTER (ELECTROSURGICAL) ×3 IMPLANT
RTRCTR C-SECT PINK 25CM LRG (MISCELLANEOUS) IMPLANT
SEPRAFILM MEMBRANE 5X6 (MISCELLANEOUS) IMPLANT
STRIP CLOSURE SKIN 1X5 (GAUZE/BANDAGES/DRESSINGS) ×2 IMPLANT
STRIP CLOSURE STERI-STRIP 1X5 (GAUZE/BANDAGES/DRESSINGS) ×1
SUT PLAIN 0 NONE (SUTURE) IMPLANT
SUT PROLENE 1 CT (SUTURE) ×3 IMPLANT
SUT VIC AB 0 CT1 36 (SUTURE) ×12 IMPLANT
SUT VIC AB 2-0 CT1 27 (SUTURE) ×2
SUT VIC AB 2-0 CT1 TAPERPNT 27 (SUTURE) ×1 IMPLANT
SUT VIC AB 4-0 KS 27 (SUTURE) ×3 IMPLANT
TOWEL OR 17X24 6PK STRL BLUE (TOWEL DISPOSABLE) ×3 IMPLANT
TRAY FOLEY W/BAG SLVR 14FR LF (SET/KITS/TRAYS/PACK) ×3 IMPLANT
WATER STERILE IRR 1000ML POUR (IV SOLUTION) ×3 IMPLANT

## 2019-02-17 NOTE — Progress Notes (Addendum)
Labor Progress Note Kristina Alvarado is a 29 y.o. G2P0010 at [redacted]w[redacted]d presented for PROM.  S: Feels very tired.  O:  BP 131/83   Pulse 88   Temp 98 F (36.7 C) (Oral)   Resp 18   Ht 4' 11.5" (1.511 m)   Wt 95.6 kg   LMP 05/21/2018 (Exact Date)   SpO2 100%   BMI 41.84 kg/m  EFM: FHR: 140 BPM, Variability: moderate, Accelerations: absent, Decelerations: present late  CVE: Dilation: 5.5 Effacement (%): 70 Cervical Position: Posterior Station: -2 Presentation: Vertex Exam by:: Genia Hotter, MD   A&P: 29 y.o. G2P0010 [redacted]w[redacted]d @ [redacted]w[redacted]d who presented for PROM.  #Labor: s/p FB, 5.5cm, cytotec x2. Recurrent late decelerations on EFM. Administered amnioinfusion (0830). Continue to monitor. #Pain: Epidural #FWB: Category 2, recurrent late decelerations. #GBS positive, PCN (01/06 0700) #Anticipated MOD: NSVD  Marjie Skiff, Medical Student 8:51 AM  I saw and evaluated the patient. I agree with the findings and the plan of care as documented in the resident's note. Will trial amnioinfusion. Pit not needed as MVU's adequate with IUPC.  Jerilynn Birkenhead, MD [facility] Family Medicine Fellow, [facility] for [employer], [facility]

## 2019-02-17 NOTE — Anesthesia Preprocedure Evaluation (Signed)
Anesthesia Evaluation  Patient identified by MRN, date of birth, ID band Patient awake    Reviewed: Allergy & Precautions, Patient's Chart, lab work & pertinent test results  History of Anesthesia Complications Negative for: history of anesthetic complications  Airway Mallampati: II  TM Distance: >3 FB Neck ROM: Full    Dental no notable dental hx.    Pulmonary former smoker,    Pulmonary exam normal        Cardiovascular negative cardio ROS Normal cardiovascular exam     Neuro/Psych negative neurological ROS  negative psych ROS   GI/Hepatic negative GI ROS, Neg liver ROS,   Endo/Other  Morbid obesity  Renal/GU negative Renal ROS  negative genitourinary   Musculoskeletal negative musculoskeletal ROS (+)   Abdominal   Peds  Hematology  (+) anemia ,   Anesthesia Other Findings Day of surgery medications reviewed with patient.  Reproductive/Obstetrics (+) Pregnancy                             Anesthesia Physical Anesthesia Plan  ASA: III  Anesthesia Plan: Epidural   Post-op Pain Management:    Induction:   PONV Risk Score and Plan: Treatment may vary due to age or medical condition  Airway Management Planned: Natural Airway  Additional Equipment:   Intra-op Plan:   Post-operative Plan:   Informed Consent: I have reviewed the patients History and Physical, chart, labs and discussed the procedure including the risks, benefits and alternatives for the proposed anesthesia with the patient or authorized representative who has indicated his/her understanding and acceptance.       Plan Discussed with:   Anesthesia Plan Comments:         Anesthesia Quick Evaluation

## 2019-02-17 NOTE — Discharge Summary (Addendum)
Postpartum Discharge Summary     Patient Name: Kristina Alvarado DOB: 1990/09/26 MRN: [id number]  Date of admission: 02/16/2019 Delivering Provider: CONSTANT, PEGGY   Date of discharge: 02/20/2019  Admitting diagnosis: Normal labor [O80, Z37.9] Intrauterine pregnancy: [redacted]w[redacted]d    Secondary diagnosis:  Active Problems:   Anemia   Preterm premature rupture of membranes   Preeclampsia  Additional problems: None     Discharge diagnosis: Term Pregnancy Delivered and Preeclampsia (mild)                                                                                                Post partum procedures:None  Augmentation: Pitocin, Cytotec and Foley Balloon  Complications: RERX>54hours  Hospital course:  Induction of Labor With Cesarean Section  29y.o. yo G2P0010 at 361w0das admitted to the hospital 02/16/2019 for induction of labor. Patient PROM and eventually induced with Cytotec, Foley balloon and Pitocin. Patient was ~ 5 cm for 11 hours with intermittent Cat II tracing despite adequate ctx. The patient went for cesarean section due to Non-Reassuring FHR, and delivered a Viable infant,02/17/2019  Membrane Rupture Time/Date: 1:00 PM ,02/16/2019   Details of operation can be found in separate operative Note.  Patient had an uncomplicated postpartum course. Received Nexplanon. BP's monitored and normal range but on upper end; Norvasc 2.5 mg prescribed and patient to watch BP's at home. PO iron and stool softeners prescribed. She is ambulating, tolerating a regular diet, passing flatus, and urinating well.  Patient is discharged home in stable condition on 02/20/19.                                   Delivery time: 5:12 PM    Magnesium Sulfate received: No BMZ received: No Rhophylac:No MMR:No Transfusion:No  Physical exam  Vitals:   02/19/19 0535 02/19/19 1330 02/19/19 2138 02/20/19 0508  BP: 115/73 127/86 (!) 147/93 130/80  Pulse: 75 93 88 85  Resp: '18 17 16 17  '$ Temp: 98.5 F (36.9  C) 98.8 F (37.1 C) 98 F (36.7 C) 98.6 F (37 C)  TempSrc: Oral Oral Oral Oral  SpO2: 100% 100% 100% 100%  Weight:      Height:       General: alert, cooperative and no distress Lochia: appropriate Uterine Fundus: firm Incision: Healing well with no significant drainage, No significant erythema, Dressing is clean, dry, and intact DVT Evaluation: No evidence of DVT seen on physical exam. Labs: Lab Results  Component Value Date   WBC 20.3 (H) 02/18/2019   HGB 9.3 (L) 02/18/2019   HCT 27.6 (L) 02/18/2019   MCV 88.2 02/18/2019   PLT 202 02/18/2019   CMP Latest Ref Rng & Units 02/18/2019  Glucose 70 - 99 mg/dL -  BUN 6 - 20 mg/dL -  Creatinine 0.44 - 1.00 mg/dL 0.94  Sodium 135 - 145 mmol/L -  Potassium 3.5 - 5.1 mmol/L -  Chloride 98 - 111 mmol/L -  CO2 22 - 32 mmol/L -  Calcium 8.9 - 10.3 mg/dL -  Total Protein 6.5 - 8.1 g/dL -  Total Bilirubin 0.3 - 1.2 mg/dL -  Alkaline Phos 38 - 126 U/L -  AST 15 - 41 U/L -  ALT 0 - 44 U/L -    Discharge instruction: per After Visit Summary and "Baby and Me Booklet".  After visit meds:  Allergies as of 02/20/2019   No Known Allergies     Medication List    STOP taking these medications   AMBULATORY NON FORMULARY MEDICATION   docusate sodium 100 MG capsule Commonly known as: COLACE   ondansetron 8 MG tablet Commonly known as: ZOFRAN   promethazine 25 MG suppository Commonly known as: PHENERGAN     TAKE these medications   acetaminophen 325 MG tablet Commonly known as: TYLENOL Take 2 tablets (650 mg total) by mouth every 6 (six) hours as needed for mild pain (temperature > 101.5.).   amLODipine 2.5 MG tablet Commonly known as: NORVASC Take 1 tablet (2.5 mg total) by mouth at bedtime.   ferrous sulfate 324 (65 Fe) MG Tbec Take 1 tablet (325 mg total) by mouth daily.   ibuprofen 800 MG tablet Commonly known as: ADVIL Take 1 tablet (800 mg total) by mouth every 8 (eight) hours.   oxyCODONE 5 MG immediate release  tablet Commonly known as: Oxy IR/ROXICODONE Take 1-2 tablets (5-10 mg total) by mouth every 4 (four) hours as needed for moderate pain.   polyethylene glycol 17 g packet Commonly known as: MIRALAX / GLYCOLAX Take 17 g by mouth daily.   prenatal multivitamin Tabs tablet Take 1 tablet by mouth daily at 12 noon.   senna-docusate 8.6-50 MG tablet Commonly known as: Senokot-S Take 2 tablets by mouth daily.       Diet: routine diet  Activity: Advance as tolerated. Pelvic rest for 6 weeks.   Outpatient follow up:4 weeks Follow up Appt: Future Appointments  Date Time Provider [place]  03/07/2019 10:20 AM [place]  03/22/2019 10:55 AM Jorje Guild, NP [place] [facility]   Follow up Visit:   Please schedule this patient for Postpartum visit in: 4 weeks with the following provider: Any provider For C/S patients schedule nurse incision check in weeks 2 weeks: yes High risk pregnancy complicated by: HTN Delivery mode:  CS Anticipated Birth Control:  Nexplanon PP Procedures needed: BP and incision check  Schedule Integrated BH visit: no    Newborn Data: Live born female  Birth Weight: 2345g  APGAR (1 MIN): 8   APGAR (5 MINS): 9   APGAR (10 MINS):    Newborn Delivery   Birth date/time: 02/17/2019 17:12:00 Delivery type: C-Section, Low Transverse C-section categorization: Primary      Baby Feeding: Breast Disposition:home with mother   02/20/2019 Chauncey Mann, MD

## 2019-02-17 NOTE — Consult Note (Signed)
Neonatology Note:   Attendance at C-section:    I was asked by Dr. Fair to attend this emergent C/S at term for prolonged decels. The mother is a G2P0010, GBS + aIAP with good prenatal care. ROM 28h 12m prior to delivery, fluid clear. Infant vigorous with good spontaneous cry and tone. +60 sec DCC.  Needed minimal bulb suctioning. Ap 8/9. Lungs clear to ausc in DR. Family updated.  To CN to care of Pediatrician.  Deijah Spikes C. Keddrick Wyne, MD 

## 2019-02-17 NOTE — Progress Notes (Cosign Needed Addendum)
Kristina Alvarado is a 29 y.o. G2P0010 at [redacted]w[redacted]d by early [place] presenting for SROM.  Subjective: Getting epidural   Objective: BP (!) 139/95   Pulse 83   Temp 98.7 F (37.1 C) (Oral)   Resp 18   Ht 4' 11.5" (1.511 m)   Wt 95.6 kg   LMP 05/21/2018 (Exact Date)   SpO2 99%   BMI 41.84 kg/m  No intake/output data recorded. No intake/output data recorded.  FHT:  FHR: 135 bpm, variability: moderate,  accelerations:  Present,  decelerations:  Absent UC:   Irregular 2-6 minutes SVE:   Dilation: 4 Effacement (%): 50 Station: -3 Exam by:: amber pope, rn   Labs: Lab Results  Component Value Date   WBC 7.3 02/16/2019   HGB 10.7 (L) 02/16/2019   HCT 31.9 (L) 02/16/2019   MCV 89.9 02/16/2019   PLT 204 02/16/2019    Assessment / Plan: Kristina Alvarado is a 29 yo G2p0010 @ 36+6 by early [place] presenting for SROM.   Labor: s/p FB. 4 cm. cytotec x1 (2200). Likely dose another cytotec ~0200 since still thick at check an hour ago.  Fetal Wellbeing:  Category I Pain Control:  Epidural  I/D:  GBS +, PCN Anticipated MOD:  NSVD  [name] Plan 02/17/2019, 1:34 AM

## 2019-02-17 NOTE — Progress Notes (Signed)
Kristina Alvarado is a 29 y.o. G2P0010 at [redacted]w[redacted]d by early [place] presenting for SROM.  Subjective: Just got more comfortable, lying on her left side.   Objective: BP (!) 134/93   Pulse 91   Temp 98.7 F (37.1 C) (Oral)   Resp 18   Ht 4' 11.5" (1.511 m)   Wt 95.6 kg   LMP 05/21/2018 (Exact Date)   SpO2 100%   BMI 41.84 kg/m  I/O last 3 completed shifts: In: -  Out: 200 [Urine:200] No intake/output data recorded.  FHT:  FHR: 135 bpm, variability: moderate,  accelerations:  Present,  decelerations:  Absent UC: Regular 2 minutes SVE:   Dilation: 5.5 Effacement (%): 70 Station: -2 Exam by:: Kristina Hotter, MD  Labs: Lab Results  Component Value Date   WBC 7.3 02/16/2019   HGB 10.7 (L) 02/16/2019   HCT 31.9 (L) 02/16/2019   MCV 89.9 02/16/2019   PLT 204 02/16/2019   Assessment / Plan: Kristina Alvarado is a 28 yo G2p0010 @ 36+6 by early [place] presenting for SROM.   Labor: s/p FB. 4 cm. cytotec x2. IUPC placed. Adequate contractions, 265 MVUs Fetal Wellbeing:  Category I - Some variables that improved with repositioning.  Pain Control:  Epidural  I/D:  GBS +, PCN Anticipated MOD:  NSVD  [name] Plan 02/17/2019, 7:03 AM

## 2019-02-17 NOTE — Transfer of Care (Signed)
Immediate Anesthesia Transfer of Care Note  Patient: Kristina Alvarado  Procedure(s) Performed: CESAREAN SECTION (N/A )  Patient Location: PACU  Anesthesia Type:Epidural  Level of Consciousness: awake  Airway & Oxygen Therapy: Patient Spontanous Breathing  Post-op Assessment: Report given to RN and Post -op Vital signs reviewed and stable  Post vital signs: Reviewed and stable  Last Vitals:  Vitals Value Taken Time  BP 103/61 02/17/19 1748  Temp    Pulse 113 02/17/19 1751  Resp 19 02/17/19 1751  SpO2 100 % 02/17/19 1751  Vitals shown include unvalidated device data.  Last Pain:  Vitals:   02/17/19 1231  TempSrc: Oral  PainSc:          Complications: No apparent anesthesia complications

## 2019-02-17 NOTE — Anesthesia Procedure Notes (Signed)
Epidural Patient location during procedure: OB Start time: 02/17/2019 1:27 AM End time: 02/17/2019 1:31 AM  Staffing Anesthesiologist: Kaylyn Layer, MD Performed: anesthesiologist   Preanesthetic Checklist Completed: patient identified, IV checked, risks and benefits discussed, monitors and equipment checked, pre-op evaluation and timeout performed  Epidural Patient position: sitting Prep: DuraPrep and site prepped and draped Patient monitoring: continuous pulse ox, blood pressure and heart rate Approach: midline Location: L3-L4 Injection technique: LOR air  Needle:  Needle type: Tuohy  Needle gauge: 17 G Needle length: 9 cm Needle insertion depth: 7 cm Catheter type: closed end flexible Catheter size: 19 Gauge Catheter at skin depth: 12 cm Test dose: negative and Other (1% lidocaine)  Assessment Events: blood not aspirated, injection not painful, no injection resistance, no paresthesia and negative IV test  Additional Notes Patient identified. Risks, benefits, and alternatives discussed with patient including but not limited to bleeding, infection, nerve damage, paralysis, failed block, incomplete pain control, headache, blood pressure changes, nausea, vomiting, reactions to medication, itching, and postpartum back pain. Confirmed with bedside nurse the patient's most recent platelet count. Confirmed with patient that they are not currently taking any anticoagulation, have any bleeding history, or any family history of bleeding disorders. Patient expressed understanding and wished to proceed. All questions were answered. Sterile technique was used throughout the entire procedure. Please see nursing notes for vital signs.   Crisp LOR on first pass. Test dose was given through epidural catheter and negative prior to continuing to dose epidural or start infusion. Warning signs of high block given to the patient including shortness of breath, tingling/numbness in hands, complete  motor block, or any concerning symptoms with instructions to call for help. Patient was given instructions on fall risk and not to get out of bed. All questions and concerns addressed with instructions to call with any issues or inadequate analgesia.  Reason for block:procedure for pain

## 2019-02-17 NOTE — Progress Notes (Signed)
Labor Progress Note Kristina Alvarado is a 29 y.o. G2P0010 at [redacted]w[redacted]d presented for PROM.  S: Feeling tired and uncomfortable with ctx.   O:  BP 140/85   Pulse 86   Temp 98.3 F (36.8 C) (Oral)   Resp 18   Ht 4' 11.5" (1.511 m)   Wt 95.6 kg   LMP 05/21/2018 (Exact Date)   SpO2 100%   BMI 41.84 kg/m  EFM: FHR: 145 BPM, Variability: moderate, Accelerations: occasional, Decelerations: intermittent lates and variables   CVE: Dilation: 5.5 Effacement (%): 70 Cervical Position: Posterior Station: -2 Presentation: Vertex Exam by:: Dr. Morene Antu    A&P: 29 y.o. G2P0010 [redacted]w[redacted]d @ [redacted]w[redacted]d who presented for PROM.  #Labor: s/p FB, cytotec x2. Amnioinfusion running. Discussed plan with patient and Dr. Jolayne Panther. Has now been ruptured 24h. Unchanged cervix since around 0600, MVU's mostly adequate since that time without IUPC. Will start low dose Pit in effort to improve ctx pattern and ideally cervical dilation. If FHR unable to tolerate Pit, will likely proceed with CS. Patient understanding and in agreement.  #Pain: Epidural #FWB: Category II: reassuring for moderate variability and occasional accels  #GBS positive, PCN (01/06 0700) #Anticipated MOD: NSVD  Joselyn Arrow, MD 1:23 PM

## 2019-02-17 NOTE — Lactation Note (Signed)
This note was copied from a baby's chart. Lactation Consultation Note  Patient Name: Kristina Alvarado STMHD'Q Date: 02/17/2019 Reason for consult: Initial assessment;Primapara;1st time breastfeeding;Infant < 6lbs;Early term 37-38.6wks;Other (Comment)(IUGR)  4 hours old  ETI female < 6 lbs who is being exclusively BF by her mother, she's a P1. Mom reported (+) breast changes during the pregnancy. She participated in the [facility] program at the [facility] and she's already familiar with hand expression and able to get colostrum when doing so. However, when [facility] assisted with hand expression, no colostrum was noted, mom has flat nipples and her tissue is non-compressible.  Set mom up with a DEBP and breast shells, instructions, cleaning and storage were reviewed. Baby's first serum glucose was 90, awaiting results for second serum glucose, it was taken during [facility] consultation. Offered assistance with latch and mom agreed to wake baby up to feed, she fell asleep after her labs were taken.   Baby started crying when woken up and would not even suck on a gloved finger. When LC took baby to breast STS in football position, baby calmed down when encountered mom's breast, but she still wouldn't latch or suck, mom's nipples are going to need a good amount of work for her tissue to become compressible. Reviewed normal newborn behavior, cluster feeding, feeding cues, pumping schedule and LPI policy.  Parents understand that if baby's second serum glucose comes back low, supplementation may be needed. Mom wishes to supplement with donor milk if medically necessary, but regardless of serum glucose reading, they may start supplementing tonight due to baby's birth weight and difficult latch. Mom didn't bring a bra to the hospital, but she'll take her breast shells home, daytime use only. Mom falling asleep during [facility] consultation, she may need some reinforcing regarding education provided.  Feeding plan:  1. Encouraged mom  to feed baby STS 8-12 times/24 hours or sooner if feeding cues are present 2. Mom will pump every 3 hours after feedings and will offer any amount of EBM she may get 3. Parents will start supplementing with donor milk, either tonight or tomorrow morning according to supplementation guidelines 4. She'll start wearing her breast shells once she gets home  Parents agreeable with feeding plan. BF brochure, BF resources, feeding diary and LPI policy were reviewed. Dad present and supportive. Parents reported all questions and concerns were answered, they're both aware of LC OP services and will call PRN.   Maternal Data Formula Feeding for Exclusion: No Has patient been taught Hand Expression?: Yes Does the patient have breastfeeding experience prior to this delivery?: No  Feeding Feeding Type: Breast Fed  LATCH Score Latch: Repeated attempts needed to sustain latch, nipple held in mouth throughout feeding, stimulation needed to elicit sucking reflex.  Audible Swallowing: None  Type of Nipple: Flat  Comfort (Breast/Nipple): Soft / non-tender  Hold (Positioning): Full assist, staff holds infant at breast  LATCH Score: 4  Interventions Interventions: Breast feeding basics reviewed;Assisted with latch;Skin to skin;Breast massage;Hand express;Breast compression;DEBP;Adjust position;Reverse pressure;Shells;Support pillows  Lactation Tools Discussed/Used Tools: Pump;Shells Shell Type: Inverted Breast pump type: Double-Electric Breast Pump WIC Program: Yes Pump Review: Setup, frequency, and cleaning Initiated by:: MPeck Date initiated:: 02/17/19   Consult Status Consult Status: Follow-up Date: 02/18/19 Follow-up type: In-patient    Kristina Alvarado Venetia Constable 02/17/2019, 10:08 PM

## 2019-02-17 NOTE — Op Note (Signed)
Kristina Alvarado PROCEDURE DATE: 02/16/2019 - 02/17/2019  PREOPERATIVE DIAGNOSIS: Intrauterine pregnancy at  [redacted]w[redacted]d weeks gestation; failure to progress: arrest of dilation and non-reassuring fetal status  POSTOPERATIVE DIAGNOSIS: The same  PROCEDURE:     Cesarean Section  SURGEON:  Dr. Catalina Antigua  ASSISTANT: Dr. Morene Antu  INDICATIONS: Kristina Alvarado is a 29 y.o. G2P0010 at [redacted]w[redacted]d scheduled for cesarean section secondary to failure to progress: arrest of dilation and non-reassuring fetal status.  The risks of cesarean section discussed with the patient included but were not limited to: bleeding which may require transfusion or reoperation; infection which may require antibiotics; injury to bowel, bladder, ureters or other surrounding organs; injury to the fetus; need for additional procedures including hysterectomy in the event of a life-threatening hemorrhage; placental abnormalities wth subsequent pregnancies, incisional problems, thromboembolic phenomenon and other postoperative/anesthesia complications. The patient concurred with the proposed plan, giving informed written consent for the procedure.    FINDINGS:  Viable female infant in cephalic presentation.  Apgars 8 and 9.  Clear amniotic fluid.  Intact placenta, three vessel cord.  Normal uterus, fallopian tubes and ovaries bilaterally.  ANESTHESIA:    Spinal INTRAVENOUS FLUIDS:1200 ml ESTIMATED BLOOD LOSS: 217 mL ml URINE OUTPUT:  25 ml SPECIMENS: Placenta sent to L&D COMPLICATIONS: None immediate  PROCEDURE IN DETAIL:  The patient received intravenous antibiotics and had sequential compression devices applied to her lower extremities while in the preoperative area.  She was then taken to the operating room where anesthesia was induced and was found to be adequate. A foley catheter was placed into her bladder and attached to Zameria Vogl gravity. She was then placed in a dorsal supine position with a leftward tilt, and prepped and draped  in a sterile manner. After an adequate timeout was performed, a Pfannenstiel skin incision was made with scalpel and carried through to the underlying layer of fascia. The fascia was incised in the midline and this incision was extended bilaterally using the Mayo scissors. Kocher clamps were applied to the superior aspect of the fascial incision and the underlying rectus muscles were dissected off bluntly. A similar process was carried out on the inferior aspect of the facial incision. The rectus muscles were separated in the midline bluntly and the peritoneum was entered bluntly. The Alexis self-retaining retractor was introduced into the abdominal cavity. Attention was turned to the lower uterine segment where a transverse hysterotomy was made with a scalpel and extended bilaterally bluntly. The infant was successfully delivered and delayed cord clamping was performed for 1 minute. The cord was clamped and cut and infant was handed over to awaiting neonatology team. Uterine massage was then administered and the placenta delivered intact with three-vessel cord. The uterus was cleared of clot and debris.  The hysterotomy was closed with 0 Vicryl in a running locked fashion, and an imbricating layer was also placed with a 0 Vicryl. Overall, excellent hemostasis was noted. The pelvis copiously irrigated and cleared of all clot and debris. Hemostasis was confirmed on all surfaces.  The peritoneum and the muscles were reapproximated using 0 vicryl interrupted stitches. The fascia was then closed using 0 Vicryl in a running fashion.  The subcutaneous layer was reapproximated with plain gut and the skin was closed in a subcuticular fashion using 3.0 Vicryl. The patient tolerated the procedure well. Sponge, lap, instrument and needle counts were correct x 2. She was taken to the recovery room in stable condition.    Damaree Sargent ConstantMD  02/17/2019 5:34 PM

## 2019-02-17 NOTE — Progress Notes (Addendum)
Labor Progress Note Kristina Alvarado is a 29 y.o. G2P0010 at [redacted]w[redacted]d presented for PROM.  S: Feeling tired and still uncomfortable overall.   O:  BP (!) 139/94   Pulse 87   Temp 98.3 F (36.8 C) (Oral)   Resp 18   Ht 4' 11.5" (1.511 m)   Wt 95.6 kg   LMP 05/21/2018 (Exact Date)   SpO2 100%   BMI 41.84 kg/m  EFM: FHR: 145 BPM, Variability: moderate, Accelerations: occasional, Decelerations: intermittent lates and variables   CVE: Dilation: 6 Effacement (%): 80 Cervical Position: Posterior Station: -2 Presentation: Vertex Exam by:: Dr. Morene Antu    A&P: 29 y.o. G2P0010 [redacted]w[redacted]d @ [redacted]w[redacted]d who presented for PROM.  #Labor: s/p FB, cytotec x2. Amnioinfusion running. Some change in effacement at this exam; molding/caput present and infant feels asynclitic. FHR overall reassuring; MVU's adequate and ctx pattern improved on Pitocin. Will recheck in 2 hours if FHR allows. Guarded for vaginal delivery; CS as appropriate.  #Pain: Epidural #FWB: Category II: reassuring for moderate variability and occasional accels  #GBS positive, PCN (01/06 0700) #Anticipated MOD: NSVD #Preeclampsia: Elevated BP's intermittently today; CMP and Pr/Cr drawn. CMP WNL. Pr/Cr 0.5. Cont to monitor.   Joselyn Arrow, MD 3:10 PM

## 2019-02-18 ENCOUNTER — Encounter (HOSPITAL_COMMUNITY): Payer: Self-pay | Admitting: Family Medicine

## 2019-02-18 LAB — CBC
HCT: 27.6 % — ABNORMAL LOW (ref 36.0–46.0)
Hemoglobin: 9.3 g/dL — ABNORMAL LOW (ref 12.0–15.0)
MCH: 29.7 pg (ref 26.0–34.0)
MCHC: 33.7 g/dL (ref 30.0–36.0)
MCV: 88.2 fL (ref 80.0–100.0)
Platelets: 202 10*3/uL (ref 150–400)
RBC: 3.13 MIL/uL — ABNORMAL LOW (ref 3.87–5.11)
RDW: 13.9 % (ref 11.5–15.5)
WBC: 20.3 10*3/uL — ABNORMAL HIGH (ref 4.0–10.5)
nRBC: 0 % (ref 0.0–0.2)

## 2019-02-18 LAB — CREATININE, SERUM
Creatinine, Ser: 0.94 mg/dL (ref 0.44–1.00)
GFR calc Af Amer: >60 mL/min (ref >60)
GFR calc non Af Amer: >60 mL/min (ref >60)

## 2019-02-18 MED ORDER — ETONOGESTREL 68 MG ~~LOC~~ IMPL
68.0000 mg | DRUG_IMPLANT | Freq: Once | SUBCUTANEOUS | Status: AC
  Administered 2019-02-18: 12:00:00 68 mg via SUBCUTANEOUS
  Filled 2019-02-18: qty 1

## 2019-02-18 MED ORDER — LIDOCAINE HCL 1 % IJ SOLN
0.0000 mL | Freq: Once | INTRAMUSCULAR | Status: AC | PRN
  Administered 2019-02-18: 12:00:00 20 mL via INTRADERMAL
  Filled 2019-02-18: qty 20

## 2019-02-18 NOTE — Anesthesia Postprocedure Evaluation (Signed)
Anesthesia Post Note  Patient: Kristina Alvarado  Procedure(s) Performed: CESAREAN SECTION (N/A )     Patient location during evaluation: Mother Baby Anesthesia Type: Epidural Level of consciousness: oriented and awake and alert Pain management: pain level controlled Vital Signs Assessment: post-procedure vital signs reviewed and stable Respiratory status: spontaneous breathing and respiratory function stable Cardiovascular status: blood pressure returned to baseline and stable Postop Assessment: no headache, no backache, no apparent nausea or vomiting and able to ambulate Anesthetic complications: no    Last Vitals:  Vitals:   02/18/19 0541 02/18/19 0942  BP:  132/86  Pulse:  92  Resp:  18  Temp:  37.2 C  SpO2: 100%     Last Pain:  Vitals:   02/18/19 0942  TempSrc: Oral  PainSc: 0-No pain                 Trevor Iha

## 2019-02-18 NOTE — Procedures (Signed)

## 2019-02-18 NOTE — Progress Notes (Addendum)
Subjective: Postpartum Day 1: Cesarean Delivery Patient reports tolerating pain well, minimal bleeding, tolerating PO, + flatus and no problems voiding.    Objective: Vital signs in last 24 hours: Temp:  [97.6 F (36.4 C)-99.6 F (37.6 C)] 99.2 F (37.3 C) (01/08 0233) Pulse Rate:  [70-146] 99 (01/08 0233) Resp:  [16-24] 16 (01/08 0233) BP: (101-144)/(57-107) 135/80 (01/08 0233) SpO2:  [98 %-100 %] 100 % (01/08 0541)  Physical Exam:  General: alert, cooperative, appears stated age, no distress and moderately obese Lochia: appropriate Uterine Fundus: firm Incision: healing well, no significant drainage, no dehiscence DVT Evaluation: No evidence of DVT seen on physical exam. No significant calf/ankle edema.  Recent Labs    02/17/19 1831 02/18/19 0339  HGB 10.1* 9.3*  HCT 31.4* 27.6*    Assessment/Plan: Status post Cesarean section. Doing well postoperatively.  Continue current care, plan for discharge tomorrow. Lactation to see today, patient plans to breastfeed.  Gladys Damme 02/18/2019, 7:55 AM  I personally saw and evaluated the patient, performing the key elements of the service. I developed and verified the management plan that is described in the resident's/student's note, and I agree with the content with my edits above. VSS, HRR&R, Resp unlabored, Legs neg.  Nigel Berthold, CNM 02/21/2019 10:20 AM

## 2019-02-19 MED ORDER — FERROUS SULFATE 324 (65 FE) MG PO TBEC: 1.0000 | DELAYED_RELEASE_TABLET | Freq: Every day | ORAL | 0 refills | Status: DC

## 2019-02-19 MED ORDER — POLYETHYLENE GLYCOL 3350 17 G PO PACK: 17.0000 g | PACK | Freq: Every day | ORAL | 0 refills | Status: DC

## 2019-02-19 MED ORDER — POLYETHYLENE GLYCOL 3350 17 G PO PACK
17.0000 g | PACK | Freq: Every day | ORAL | Status: DC
  Filled 2019-02-19: qty 1

## 2019-02-19 MED ORDER — SENNOSIDES-DOCUSATE SODIUM 8.6-50 MG PO TABS: 2.0000 | ORAL_TABLET | ORAL | 0 refills | Status: DC

## 2019-02-19 MED ORDER — AMLODIPINE BESYLATE 5 MG PO TABS
2.5000 mg | ORAL_TABLET | Freq: Every day | ORAL | Status: DC
  Administered 2019-02-19: 22:00:00 2.5 mg via ORAL
  Filled 2019-02-19: qty 1

## 2019-02-19 MED ORDER — OXYCODONE HCL 5 MG PO TABS: 5.0000 mg | ORAL_TABLET | ORAL | 0 refills | Status: DC | PRN

## 2019-02-19 MED ORDER — ACETAMINOPHEN 325 MG PO TABS: 650.0000 mg | ORAL_TABLET | Freq: Four times a day (QID) | ORAL | 0 refills | Status: DC | PRN

## 2019-02-19 MED ORDER — IBUPROFEN 800 MG PO TABS: 800.0000 mg | ORAL_TABLET | Freq: Three times a day (TID) | ORAL | 0 refills | Status: DC

## 2019-02-19 MED ORDER — AMLODIPINE BESYLATE 2.5 MG PO TABS: 2.5000 mg | ORAL_TABLET | Freq: Every day | ORAL | 0 refills | Status: DC

## 2019-02-19 NOTE — Lactation Note (Signed)
This note was copied from a baby's chart. Lactation Consultation Note  Patient Name: Kristina Alvarado BIPJR'P Date: 02/19/2019   Infant is 40 hrs old. Mom says she is pumping whenever infant receives DBM, but she is unable to express anything. Hand expression was taught to Mom, but nothing was yielded.   Infant is noted to be using Similac slow-flow (yellow) nipples. Mom says infant does well with the flow & the swallows do not seem loud or too fast.   Mom recently used up her DBM. The milk lab will be delivering more soon.   [name] [facility] 02/19/2019, 10:06 AM

## 2019-02-19 NOTE — Progress Notes (Signed)
Subjective: Postpartum Day 2: Cesarean Delivery Patient reports tolerating PO, + flatus and no problems voiding. She is feeling well today and would like to DC today if possible. Breastfeeding.  Objective: Vital signs in last 24 hours: Temp:  [98.1 F (36.7 C)-98.5 F (36.9 C)] 98.5 F (36.9 C) (01/09 0535) Pulse Rate:  [75-82] 75 (01/09 0535) Resp:  [18] 18 (01/09 0535) BP: (115-132)/(73-80) 115/73 (01/09 0535) SpO2:  [100 %] 100 % (01/09 0535)  Physical Exam:  General: alert, cooperative and appears stated age Lochia: appropriate Uterine Fundus: firm Incision: healing well, no significant drainage, no dehiscence DVT Evaluation: No evidence of DVT seen on physical exam.  Recent Labs    02/17/19 1831 02/18/19 0339  HGB 10.1* 9.3*  HCT 31.4* 27.6*    Assessment/Plan: Status post Cesarean section. Doing well postoperatively.  Continue current care. Plan for DC tomorrow as baby is staying today Patient with mild Pre-E; Norvasc 2.5 mg initiated today as BP's normal range but on upper end  S/p Nexplanon placement  Breastfeeding   Kristina Alvarado N Taiquan Campanaro 02/19/2019, 12:28 PM

## 2019-02-20 MED ORDER — OXYCODONE HCL 5 MG PO TABS: 5.0000 mg | ORAL_TABLET | ORAL | 0 refills | Status: DC | PRN

## 2019-02-20 NOTE — Lactation Note (Signed)
This note was copied from a baby's chart. Lactation Consultation Note  Patient Name: Kristina Alvarado [id number] Date: 02/20/2019 Reason for consult: Follow-up assessment  I noticed that infant was in bassinet with pacifier. Mom said she had been using it to keep him quiet at night. I encouraged parents to consider increasing his volumes so that they won't feel the need to resort to using pacifier.   Mom has not pumped since yesterday. Since parents won't have access to [facility] after discharge, I asked if we could go ahead and give them Rush Barer formula (Mom has [facility]). Parents were agreeable.   Dr. Viann Fish was given an update.  [name] 02/20/2019, 10:23 AM

## 2019-02-20 NOTE — Discharge Instructions (Signed)
Cesarean Delivery, Care After This sheet gives you information about how to care for yourself after your procedure. Your health care provider may also give you more specific instructions. If you have problems or questions, contact your health care provider. What can I expect after the procedure? After the procedure, it is common to have:  A small amount of blood or clear fluid coming from the incision.  Some redness, swelling, and pain in your incision area.  Some abdominal pain and soreness.  Vaginal bleeding (lochia). Even though you did not have a vaginal delivery, you will still have vaginal bleeding and discharge.  Pelvic cramps.  Fatigue. You may have pain, swelling, and discomfort in the tissue between your vagina and your anus (perineum) if:  Your C-section was unplanned, and you were allowed to labor and push.  An incision was made in the area (episiotomy) or the tissue tore during attempted vaginal delivery. Follow these instructions at home: Incision care   Follow instructions from your health care provider about how to take care of your incision. Make sure you: ? Wash your hands with soap and water before you change your bandage (dressing). If soap and water are not available, use hand sanitizer. ? If you have a dressing, change it or remove it as told by your health care provider. ? Leave stitches (sutures), skin staples, skin glue, or adhesive strips in place. These skin closures may need to stay in place for 2 weeks or longer. If adhesive strip edges start to loosen and curl up, you may trim the loose edges. Do not remove adhesive strips completely unless your health care provider tells you to do that.  Check your incision area every day for signs of infection. Check for: ? More redness, swelling, or pain. ? More fluid or blood. ? Warmth. ? Pus or a bad smell.  Do not take baths, swim, or use a hot tub until your health care provider says it's okay. Ask your health  care provider if you can take showers.  When you cough or sneeze, hug a pillow. This helps with pain and decreases the chance of your incision opening up (dehiscing). Do this until your incision heals. Medicines  Take over-the-counter and prescription medicines only as told by your health care provider.  If you were prescribed an antibiotic medicine, take it as told by your health care provider. Do not stop taking the antibiotic even if you start to feel better.  Do not drive or use heavy machinery while taking prescription pain medicine. Lifestyle  Do not drink alcohol. This is especially important if you are breastfeeding or taking pain medicine.  Do not use any products that contain nicotine or tobacco, such as cigarettes, e-cigarettes, and chewing tobacco. If you need help quitting, ask your health care provider. Eating and drinking  Drink at least 8 eight-ounce glasses of water every day unless told not to by your health care provider. If you breastfeed, you may need to drink even more water.  Eat high-fiber foods every day. These foods may help prevent or relieve constipation. High-fiber foods include: ? Whole grain cereals and breads. ? Brown rice. ? Beans. ? Fresh fruits and vegetables. Activity   If possible, have someone help you care for your baby and help with household activities for at least a few days after you leave the hospital.  Return to your normal activities as told by your health care provider. Ask your health care provider what activities are safe for   you.  Rest as much as possible. Try to rest or take a nap while your baby is sleeping.  Do not lift anything that is heavier than 10 lbs (4.5 kg), or the limit that you were told, until your health care provider says that it is safe.  Talk with your health care provider about when you can engage in sexual activity. This may depend on your: ? Risk of infection. ? How fast you heal. ? Comfort and desire to  engage in sexual activity. General instructions  Do not use tampons or douches until your health care provider approves.  Wear loose, comfortable clothing and a supportive and well-fitting bra.  Keep your perineum clean and dry. Wipe from front to back when you use the toilet.  If you pass a blood clot, save it and call your health care provider to discuss. Do not flush blood clots down the toilet before you get instructions from your health care provider.  Keep all follow-up visits for you and your baby as told by your health care provider. This is important. Contact a health care provider if:  You have: ? A fever. ? Bad-smelling vaginal discharge. ? Pus or a bad smell coming from your incision. ? Difficulty or pain when urinating. ? A sudden increase or decrease in the frequency of your bowel movements. ? More redness, swelling, or pain around your incision. ? More fluid or blood coming from your incision. ? A rash. ? Nausea. ? Little or no interest in activities you used to enjoy. ? Questions about caring for yourself or your baby.  Your incision feels warm to the touch.  Your breasts turn red or become painful or hard.  You feel unusually sad or worried.  You vomit.  You pass a blood clot from your vagina.  You urinate more than usual.  You are dizzy or light-headed. Get help right away if:  You have: ? Pain that does not go away or get better with medicine. ? Chest pain. ? Difficulty breathing. ? Blurred vision or spots in your vision. ? Thoughts about hurting yourself or your baby. ? New pain in your abdomen or in one of your legs. ? A severe headache.  You faint.  You bleed from your vagina so much that you fill more than one sanitary pad in one hour. Bleeding should not be heavier than your heaviest period. Summary  After the procedure, it is common to have pain at your incision site, abdominal cramping, and slight bleeding from your vagina.  Check  your incision area every day for signs of infection.  Tell your health care provider about any unusual symptoms.  Keep all follow-up visits for you and your baby as told by your health care provider. This information is not intended to replace advice given to you by your health care provider. Make sure you discuss any questions you have with your health care provider. Document Revised: 08/05/2017 Document Reviewed: 08/05/2017 Elsevier Patient Education  2020 [employer]  

## 2019-02-20 NOTE — Lactation Note (Signed)
This note was copied from a baby's chart. Lactation Consultation Note  Patient Name: Kristina Alvarado [id number] Date: 02/20/2019   I entered room to speak with Mom, but she was in the bathroom. Dad was noted to be sleeping in the recliner with infant on his chest. I woke him & let him know that it was not safe to sleep in recliner with infant.   I gave [name], RN an update. LC to return later.   [name] [facility] 02/20/2019, 8:00 AM

## 2019-02-22 ENCOUNTER — Encounter: Payer: Medicaid Other | Admitting: Nurse Practitioner

## 2019-03-01 ENCOUNTER — Encounter (HOSPITAL_COMMUNITY): Payer: Self-pay

## 2019-03-01 ENCOUNTER — Ambulatory Visit (HOSPITAL_COMMUNITY): Payer: Medicaid Other

## 2019-03-07 ENCOUNTER — Encounter: Payer: Self-pay | Admitting: General Practice

## 2019-03-07 ENCOUNTER — Other Ambulatory Visit: Payer: Self-pay

## 2019-03-07 ENCOUNTER — Ambulatory Visit (INDEPENDENT_AMBULATORY_CARE_PROVIDER_SITE_OTHER): Payer: Medicaid Other | Admitting: General Practice

## 2019-03-07 VITALS — BP 101/65 | HR 81 | Ht 60.0 in | Wt 194.0 lb

## 2019-03-07 DIAGNOSIS — Z0130 Encounter for examination of blood pressure without abnormal findings: Secondary | ICD-10-CM

## 2019-03-07 DIAGNOSIS — Z5189 Encounter for other specified aftercare: Secondary | ICD-10-CM

## 2019-03-07 NOTE — Progress Notes (Signed)
Patient presents to office today for wound & BP check following primary c-section on 1/7. Patient had Pre-E with pregnancy and was sent home on Norvasc 2.5mg  which she takes at bedtime. She reports doing well since being home- denies headaches, dizziness or blurry vision. BP 101/65 today. Incision is clean, dry & intact- appears to be healing well. Wound care and signs & symptoms of infection with patient. Patient will follow up on 2/9 for pp visit.  Chase Caller RN BSN 03/07/19

## 2019-03-07 NOTE — Progress Notes (Signed)
Chart reviewed - agree with CMA/RN documentation.  ° °

## 2019-03-22 ENCOUNTER — Encounter: Payer: Self-pay | Admitting: Student

## 2019-03-22 ENCOUNTER — Telehealth (INDEPENDENT_AMBULATORY_CARE_PROVIDER_SITE_OTHER): Payer: Medicaid Other | Admitting: Student

## 2019-03-22 NOTE — Patient Instructions (Signed)
Health Maintenance, Female Adopting a healthy lifestyle and getting preventive care are important in promoting health and wellness. Ask your health care provider about:  The right schedule for you to have regular tests and exams.  Things you can do on your own to prevent diseases and keep yourself healthy. What should I know about diet, weight, and exercise? Eat a healthy diet   Eat a diet that includes plenty of vegetables, fruits, low-fat dairy products, and lean protein.  Do not eat a lot of foods that are high in solid fats, added sugars, or sodium. Maintain a healthy weight Body mass index (BMI) is used to identify weight problems. It estimates body fat based on height and weight. Your health care provider can help determine your BMI and help you achieve or maintain a healthy weight. Get regular exercise Get regular exercise. This is one of the most important things you can do for your health. Most adults should:  Exercise for at least 150 minutes each week. The exercise should increase your heart rate and make you sweat (moderate-intensity exercise).  Do strengthening exercises at least twice a week. This is in addition to the moderate-intensity exercise.  Spend less time sitting. Even light physical activity can be beneficial. Watch cholesterol and blood lipids Have your blood tested for lipids and cholesterol at 27 years of age, then have this test every 5 years. Have your cholesterol levels checked more often if:  Your lipid or cholesterol levels are high.  You are older than 27 years of age.  You are at high risk for heart disease. What should I know about cancer screening? Depending on your health history and family history, you may need to have cancer screening at various ages. This may include screening for:  Breast cancer.  Cervical cancer.  Colorectal cancer.  Skin cancer.  Lung cancer. What should I know about heart disease, diabetes, and high blood  pressure? Blood pressure and heart disease  High blood pressure causes heart disease and increases the risk of stroke. This is more likely to develop in people who have high blood pressure readings, are of African descent, or are overweight.  Have your blood pressure checked: ? Every 3-5 years if you are 18-39 years of age. ? Every year if you are 40 years old or older. Diabetes Have regular diabetes screenings. This checks your fasting blood sugar level. Have the screening done:  Once every three years after age 40 if you are at a normal weight and have a low risk for diabetes.  More often and at a younger age if you are overweight or have a high risk for diabetes. What should I know about preventing infection? Hepatitis B If you have a higher risk for hepatitis B, you should be screened for this virus. Talk with your health care provider to find out if you are at risk for hepatitis B infection. Hepatitis C Testing is recommended for:  Everyone born from 1945 through 1965.  Anyone with known risk factors for hepatitis C. Sexually transmitted infections (STIs)  Get screened for STIs, including gonorrhea and chlamydia, if: ? You are sexually active and are younger than 27 years of age. ? You are older than 27 years of age and your health care provider tells you that you are at risk for this type of infection. ? Your sexual activity has changed since you were last screened, and you are at increased risk for chlamydia or gonorrhea. Ask your health care provider if   you are at risk.  Ask your health care provider about whether you are at high risk for HIV. Your health care provider may recommend a prescription medicine to help prevent HIV infection. If you choose to take medicine to prevent HIV, you should first get tested for HIV. You should then be tested every 3 months for as long as you are taking the medicine. Pregnancy  If you are about to stop having your period (premenopausal) and  you may become pregnant, seek counseling before you get pregnant.  Take 400 to 800 micrograms (mcg) of folic acid every day if you become pregnant.  Ask for birth control (contraception) if you want to prevent pregnancy. Osteoporosis and menopause Osteoporosis is a disease in which the bones lose minerals and strength with aging. This can result in bone fractures. If you are 65 years old or older, or if you are at risk for osteoporosis and fractures, ask your health care provider if you should:  Be screened for bone loss.  Take a calcium or vitamin D supplement to lower your risk of fractures.  Be given hormone replacement therapy (HRT) to treat symptoms of menopause. Follow these instructions at home: Lifestyle  Do not use any products that contain nicotine or tobacco, such as cigarettes, e-cigarettes, and chewing tobacco. If you need help quitting, ask your health care provider.  Do not use street drugs.  Do not share needles.  Ask your health care provider for help if you need support or information about quitting drugs. Alcohol use  Do not drink alcohol if: ? Your health care provider tells you not to drink. ? You are pregnant, may be pregnant, or are planning to become pregnant.  If you drink alcohol: ? Limit how much you use to 0-1 drink a day. ? Limit intake if you are breastfeeding.  Be aware of how much alcohol is in your drink. In the [place] one drink equals one 12 oz bottle of beer (355 mL), one 5 oz glass of wine (148 mL), or one 1 oz glass of hard liquor (44 mL). General instructions  Schedule regular health, dental, and eye exams.  Stay current with your vaccines.  Tell your health care provider if: ? You often feel depressed. ? You have ever been abused or do not feel safe at home. Summary  Adopting a healthy lifestyle and getting preventive care are important in promoting health and wellness.  Follow your health care provider's instructions about healthy  diet, exercising, and getting tested or screened for diseases.  Follow your health care provider's instructions on monitoring your cholesterol and blood pressure. This information is not intended to replace advice given to you by your health care provider. Make sure you discuss any questions you have with your health care provider. Document Revised: 01/20/2018 Document Reviewed: 01/20/2018 Elsevier Patient Education  2020 [employer]  

## 2019-03-22 NOTE — Progress Notes (Signed)
I connected with@ on 03/22/19 at 10:55 AM EST by: telephone and verified that I am speaking with the correct person using two identifiers.  Patient is located at [facility] and provider is located at [place].     The purpose of this virtual visit is to provide medical care while limiting exposure to the novel coronavirus. I discussed the limitations, risks, security and privacy concerns of performing an evaluation and management service by Mychart and the availability of in person appointments. I also discussed with the patient that there may be a patient responsible charge related to this service. By engaging in this virtual visit, you consent to the provision of healthcare.  Additionally, you authorize for your insurance to be billed for the services provided during this visit.  The patient expressed understanding and agreed to proceed.  The following staff members participated in the virtual visit: Cristal Deer Capel,CMA  Post Partum Visit Note Subjective:    Ms. Kristina Alvarado is a 28 y.o. G58P1011 female who presents for a postpartum visit. She is 4 weeks postpartum following a Primary cesarean section. I have fully reviewed the prenatal and intrapartum course. The delivery was at 37 gestational weeks. Outcome: primary cesarean section, low transverse incision. Anesthesia: spinal. Postpartum course has been unremarkable. Baby's course has been unremarkable. Baby is feeding by bottle - gerber good start. Bleeding staining only. Bowel function is normal. Bladder function is normal. Patient is not sexually active. Contraception method is Nexplanon. Postpartum depression screening: .  The following portions of the patient's history were reviewed and updated as appropriate: allergies, current medications, past family history, past medical history, past social history, past surgical history and problem list.  Review of Systems Pertinent items are noted in HPI.   Objective:  There were no vitals  filed for this visit. Self-Obtained       Assessment:    Normal postpartum exam. Pap smear not done at today's visit. Last pap smear 12/2017 and results were normal. Next pap due 2022.   Plan:    1. Contraception: Nexplanon 2. Doing well. Discontinue Norvasc at this time. Pt to follow up with her PCP to monitor for hypertension 3. Follow up in:  as needed.   5 minutes of non-face-to-face time spent with the patient   Judeth Horn, NP  03/22/2019 10:47 AM

## 2019-10-03 LAB — RPR: RPR Ser Ql: NONREACTIVE — AB

## 2019-10-12 DIAGNOSIS — Z1159 Encounter for screening for other viral diseases: Secondary | ICD-10-CM | POA: Diagnosis not present

## 2019-10-12 DIAGNOSIS — Z20828 Contact with and (suspected) exposure to other viral communicable diseases: Secondary | ICD-10-CM | POA: Diagnosis not present

## 2020-02-06 IMAGING — US US MFM OB FOLLOW-UP
1 series · 13 of 28 positions shown · non-contrast
Comparison: none

[Series 1: us mfm ob follow-up · 13 of 33 slices shown]
[im 2/33]
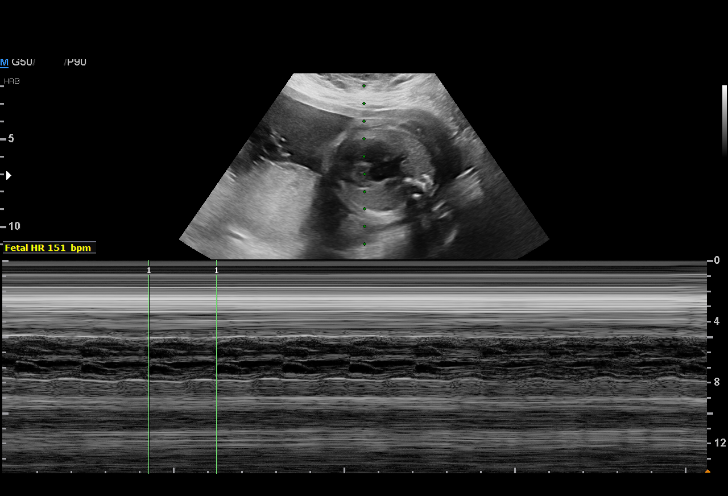
[im 4/33]
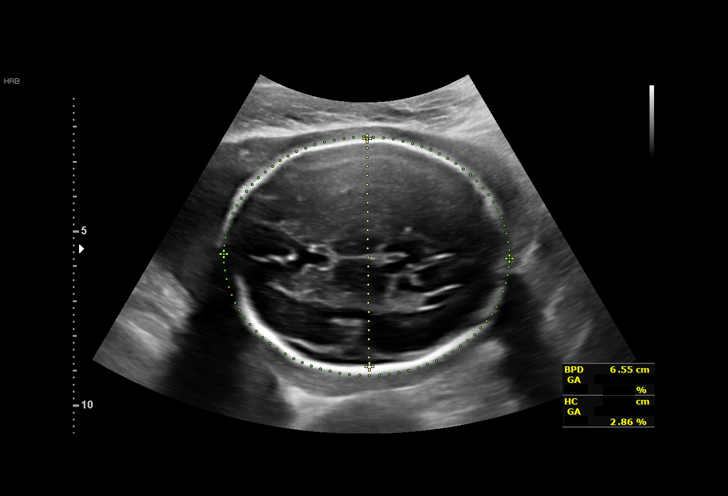
[im 6/33]
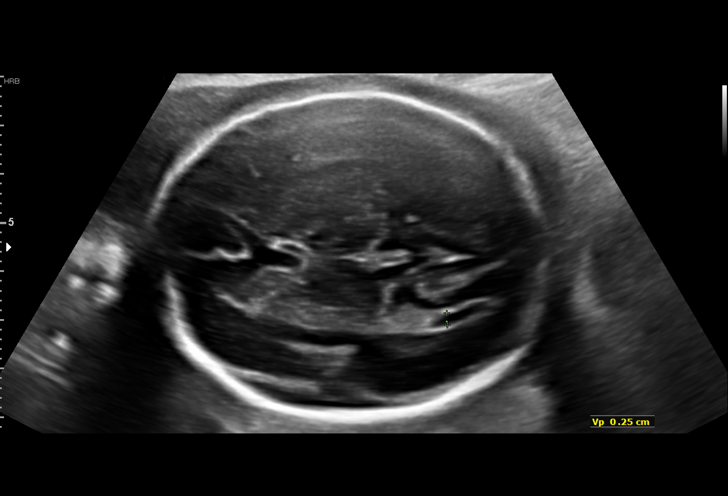
[im 9/33]
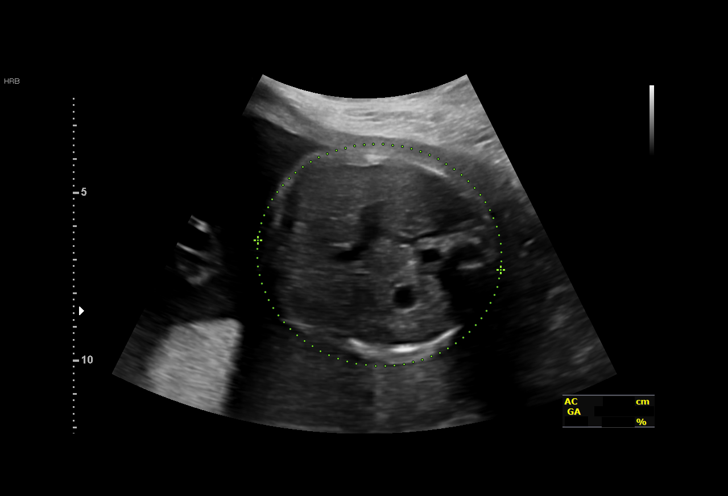
[im 11/33]
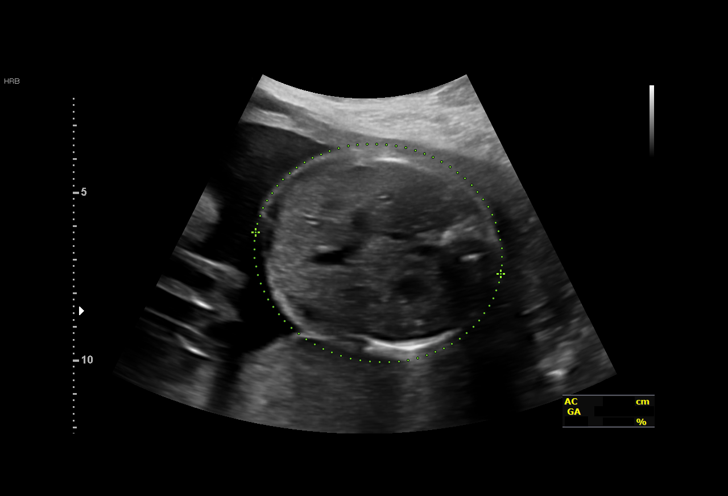
[im 14/33]
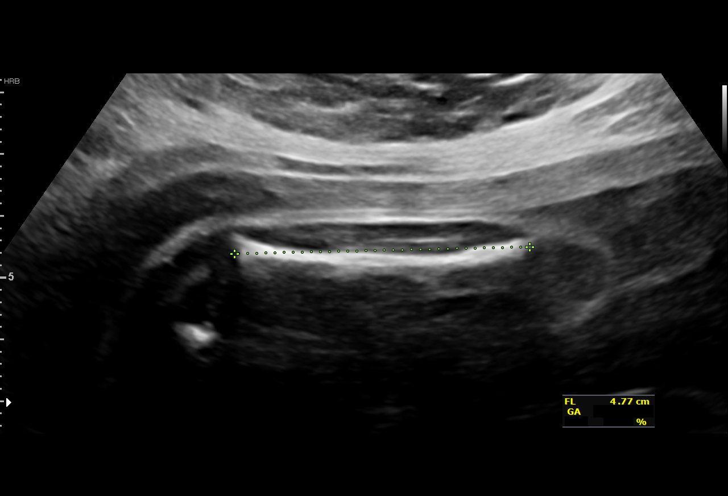
[im 17/33]
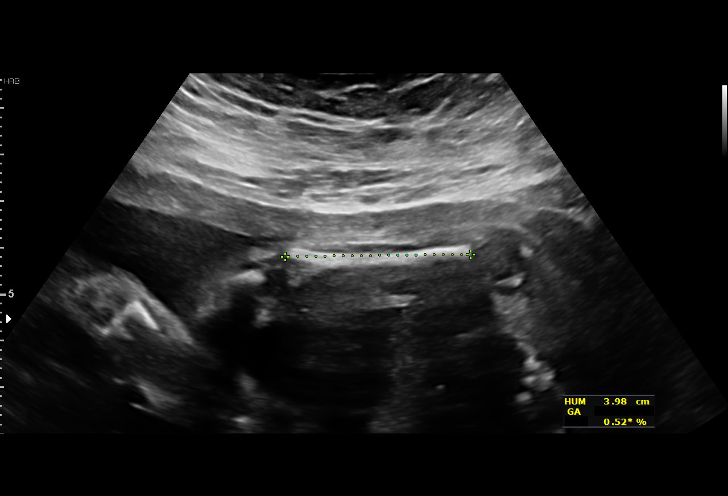
[im 19/33]
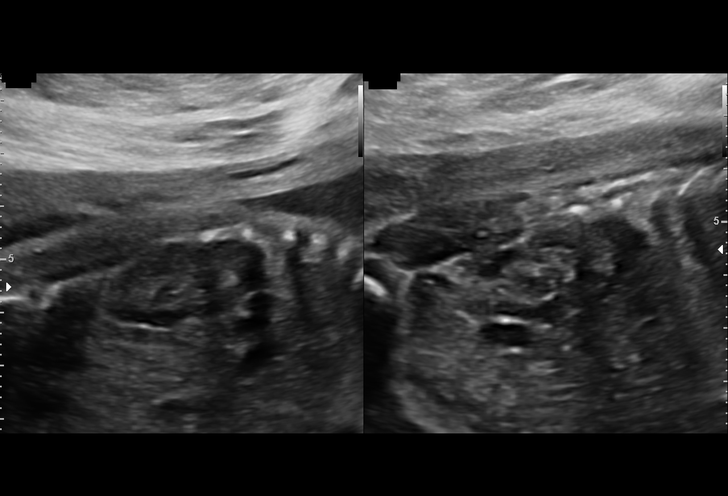
[im 22/33]
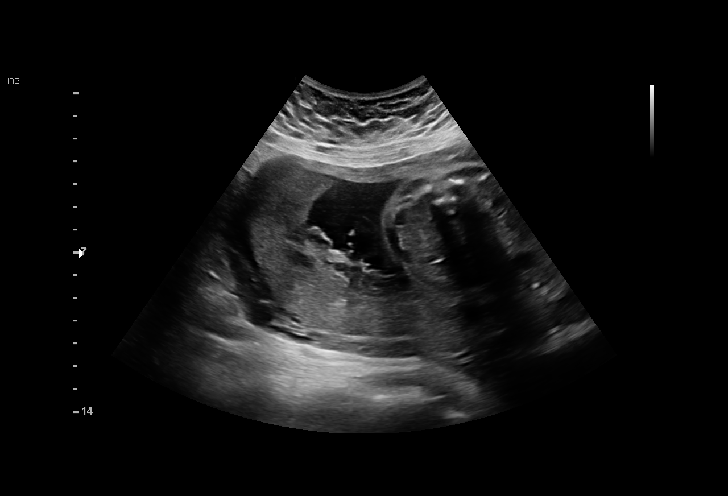
[im 24/33]
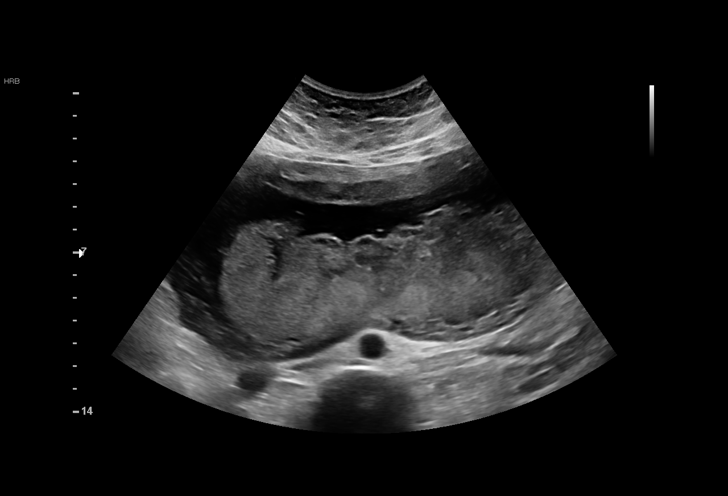
[im 27/33]
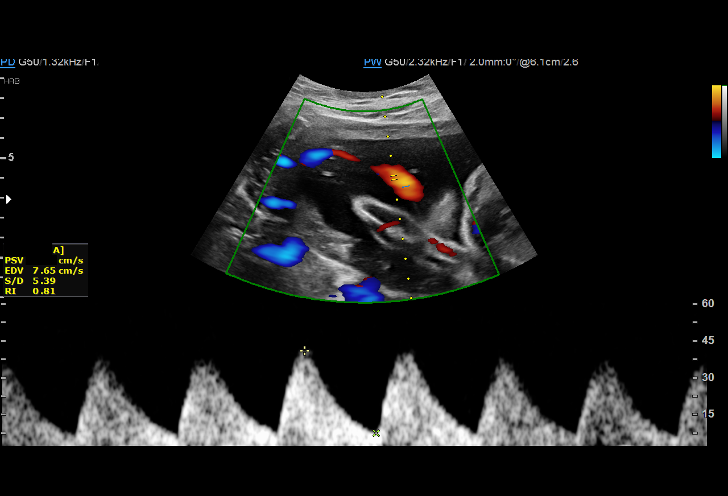
[im 29/33]
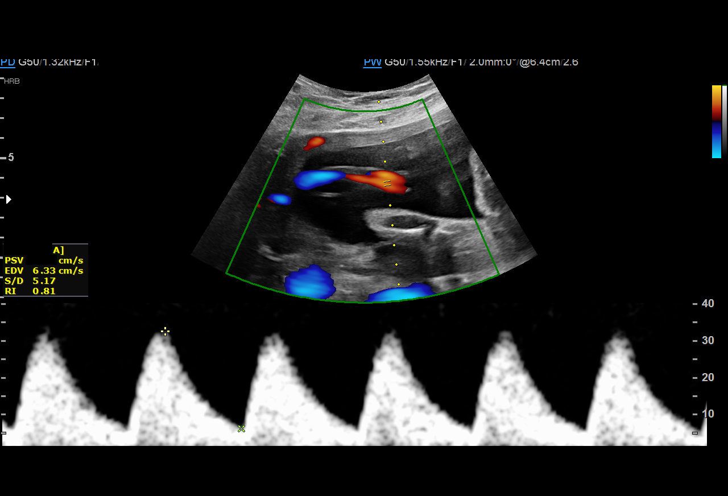
[im 31/33]
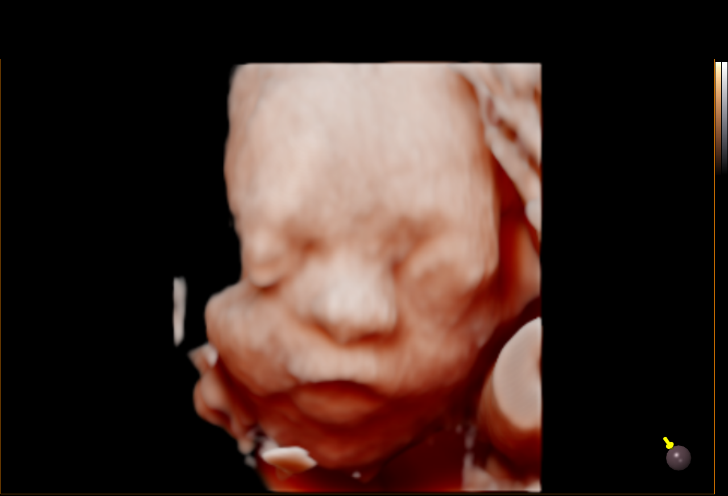

[13 of 28 positions shown; findings below may reference images not displayed]

----------------------------------------------------------------------

 ----------------------------------------------------------------------
Indications

  Maternal care for known or suspected poor
  fetal growth, second trimester, not applicable
  or unspecified IUGR
  Obesity complicating pregnancy, second
  trimester (pregravid BMI 34.37)
  27 weeks gestation of pregnancy
 ----------------------------------------------------------------------
Vital Signs

 BMI:
Fetal Evaluation

 Num Of Fetuses:         1
 Fetal Heart Rate(bpm):  151
 Cardiac Activity:       Observed
 Presentation:           Cephalic
 Placenta:               Posterior
 P. Cord Insertion:      Previously Visualized

 Amniotic Fluid
 AFI FV:      Within normal limits

                             Largest Pocket(cm)

Biometry
 BPD:      65.8  mm     G. Age:  26w 4d         24  %    CI:        77.04   %    70 - 86
                                                         FL/HC:      19.8   %    18.6 -
 HC:      237.4  mm     G. Age:  25w 6d          3  %    HC/AC:      1.09        1.05 -
 AC:      217.7  mm     G. Age:  26w 2d         20  %    FL/BPD:     71.4   %    71 - 87
 FL:         47  mm     G. Age:  25w 5d          7  %    FL/AC:      21.6   %    20 - 24
 LV:        2.5  mm

 Est. FW:     881  gm    1 lb 15 oz      10  %
OB History

 Gravidity:    2         Term:   0        Prem:   0        SAB:   0
 TOP:          0       Ectopic:  1        Living: 0
Gestational Age

 LMP:           28w 6d        Date:  05/21/18                 EDD:   02/25/19
 U/S Today:     26w 1d                                        EDD:   03/16/19
 Best:          27w 0d     Det. By:  Early Ultrasound         EDD:   03/10/19
                                     (07/20/18)
Anatomy

 Cranium:               Appears normal         Aortic Arch:            Appears normal
 Cavum:                 Appears normal         Ductal Arch:            Appears normal
 Ventricles:            Appears normal         Diaphragm:              Appears normal
 Choroid Plexus:        Previously seen        Stomach:                Appears normal, left
                                                                       sided
 Cerebellum:            Previously seen        Abdomen:                Previously seen
 Posterior Fossa:       Previously seen        Abdominal Wall:         Previously seen
 Nuchal Fold:           Previously seen        Cord Vessels:           Previously seen
 Face:                  Orbits and profile     Kidneys:                Appear normal
                        previously seen
 Lips:                  Appears normal         Bladder:                Appears normal
 Thoracic:              Appears normal         Spine:                  Appears normal
 Heart:                 Appears normal         Upper Extremities:      Previously seen
                        (4CH, axis, and
                        situs)
 RVOT:                  Appears normal         Lower Extremities:      Previously seen
 LVOT:                  Appears normal

 Other:  5th digit prev visualized. Heels visualized. Technically difficult due to
         fetal position.
Doppler - Fetal Vessels

 Umbilical Artery
  S/D     %tile
 4.96    >

Impression

 Patient returned for fetal growth assessment. On ultrasound
 performed at 23-weeks' gestation, the estimated fetal weight
 was at the 3rd percentile.
 She does not have chronic medical conditions including
 hypertension.

 On cell-free fetal DNA screening, the risks of fetal
 aneuploidies are not increased.

 On today's ultrasound, the estimated fetal weight is at the
 10th percentile. Amniotic fluid is normal and good fetal
 activity is seen. Umbilical artery showed increased S/D ratio.
 NST is reactive (71x71) with occasional variable
 decelerations.

 Patient reports good fetal movements.
 I explained the diagnosis of fetal growth restriction and
 abnormal UA Doppler studies and that we recommend close
 fetal surveillance.
 BP at our office: 115/66 mm Hg.
Recommendations

 -Continue weekly UA Doppler and NST.
 -Fetal growth assessment in 3 weeks.
 -Encouragd patient to screen for gestational diabetes.
                 Wasowicz, Jim Stanley

## 2020-02-14 IMAGING — US US MFM UA CORD DOPPLER
1 series · 13 of 28 positions shown · non-contrast
Comparison: none

[Series 1: us mfm ua cord doppler · 13 of 42 slices shown]
[im 2/42]
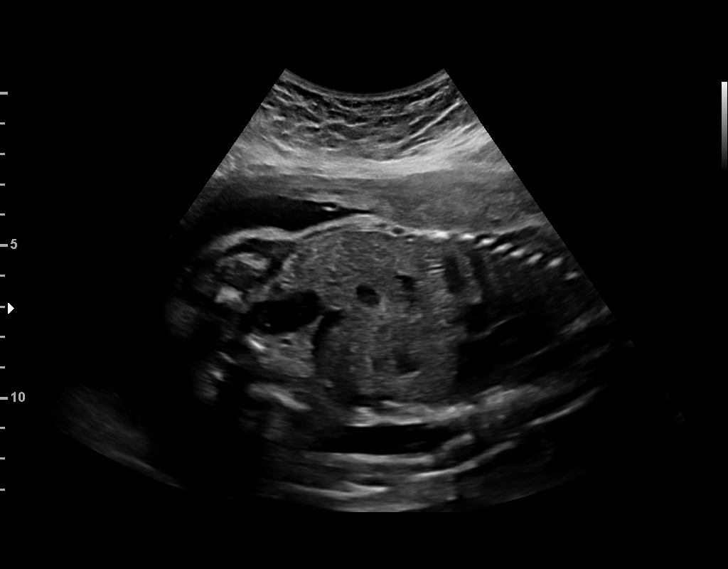
[im 5/42]
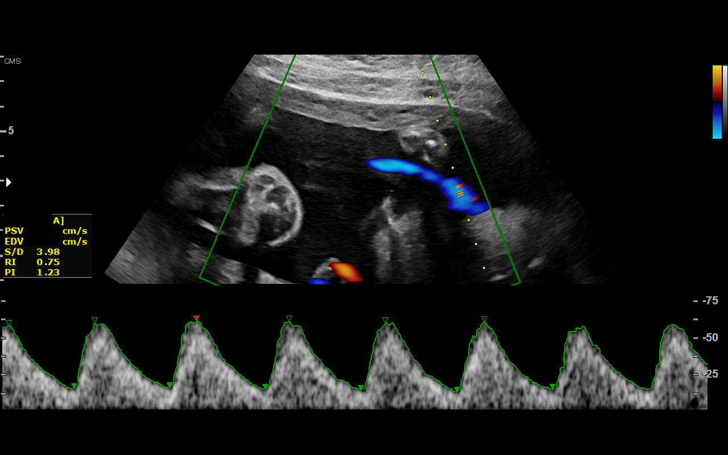
[im 8/42]
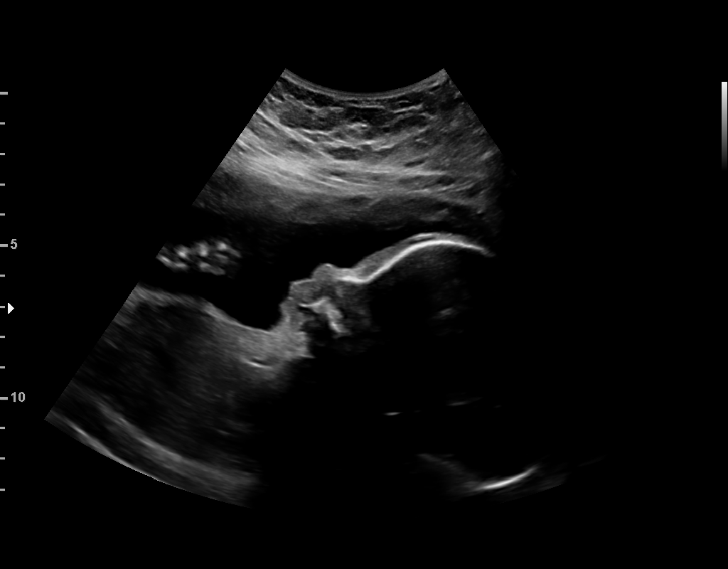
[im 11/42]
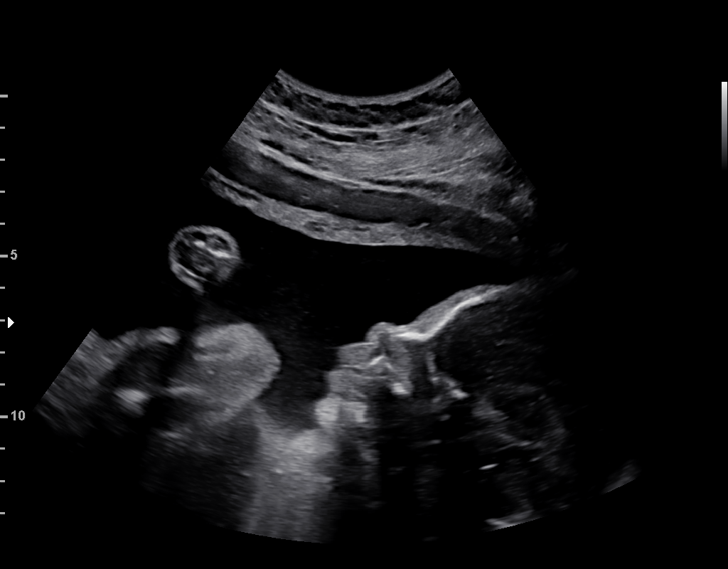
[im 14/42]
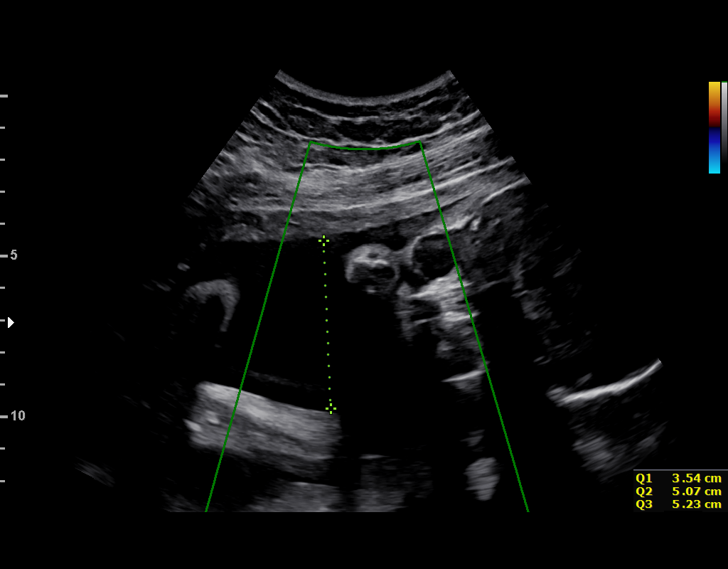
[im 17/42]
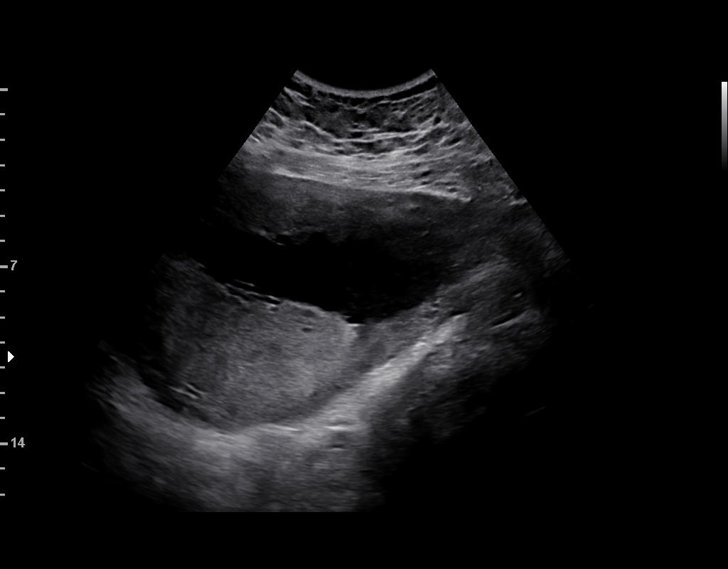
[im 22/42]
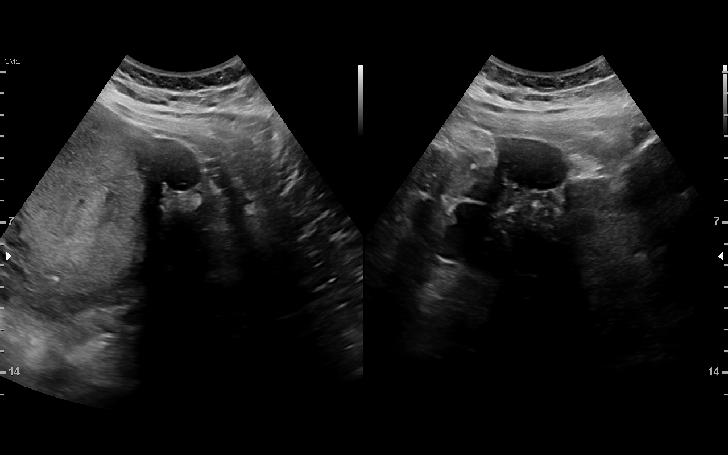
[im 25/42]
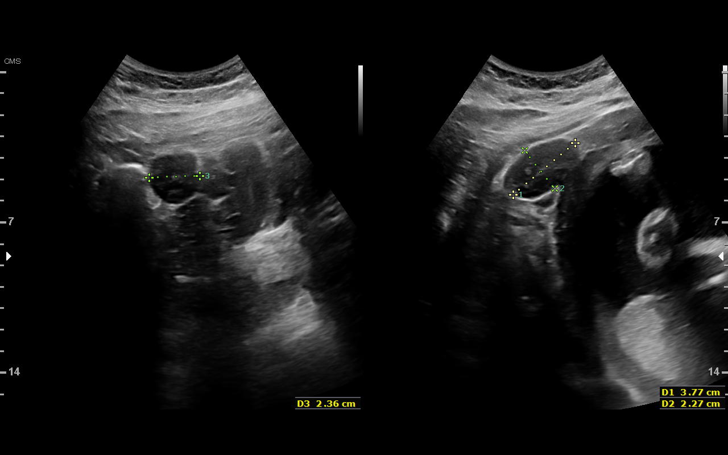
[im 28/42]
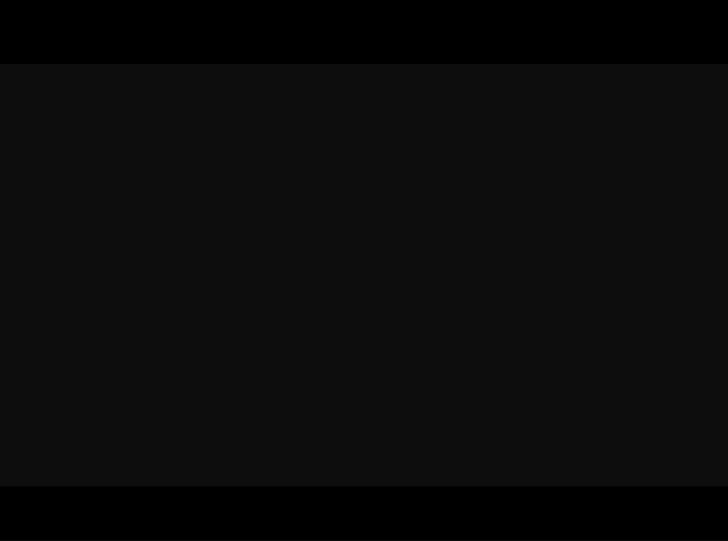
[im 31/42]
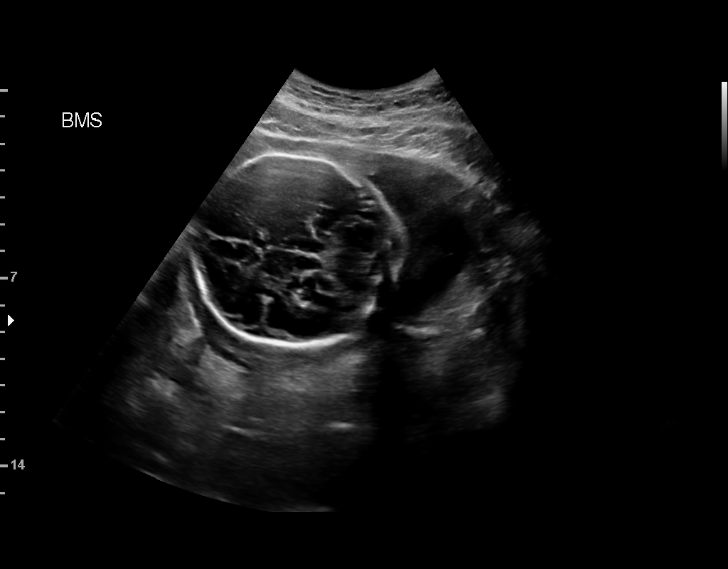
[im 34/42]
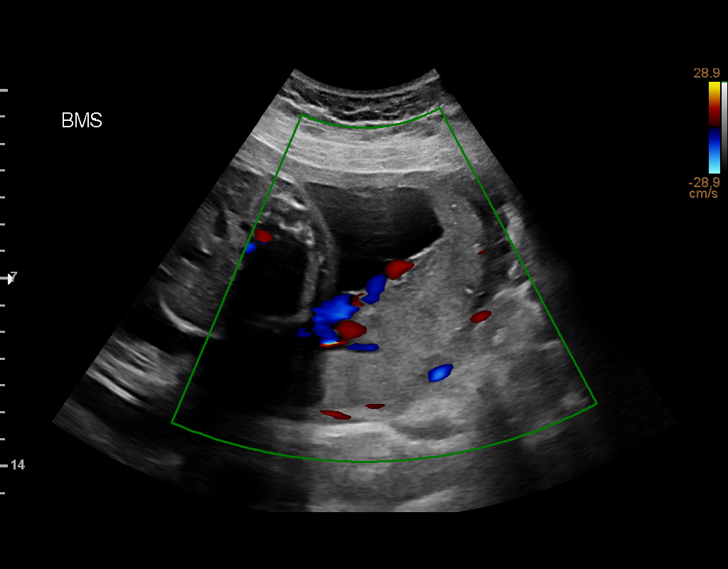
[im 37/42]
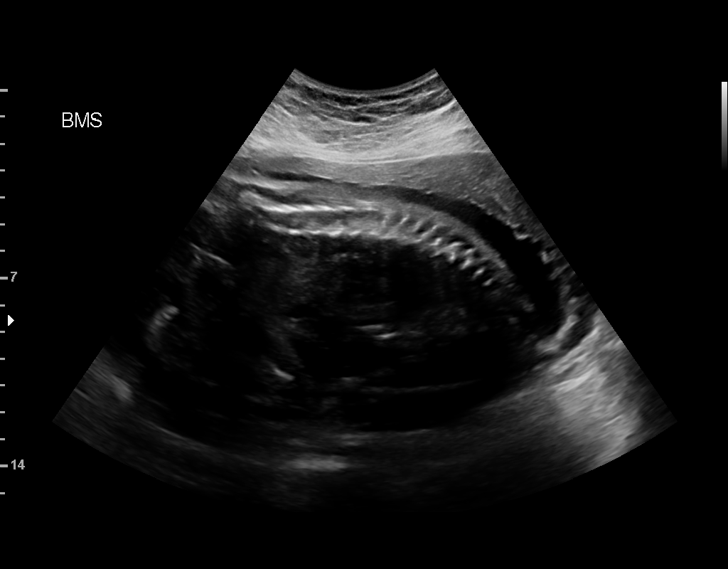
[im 40/42]
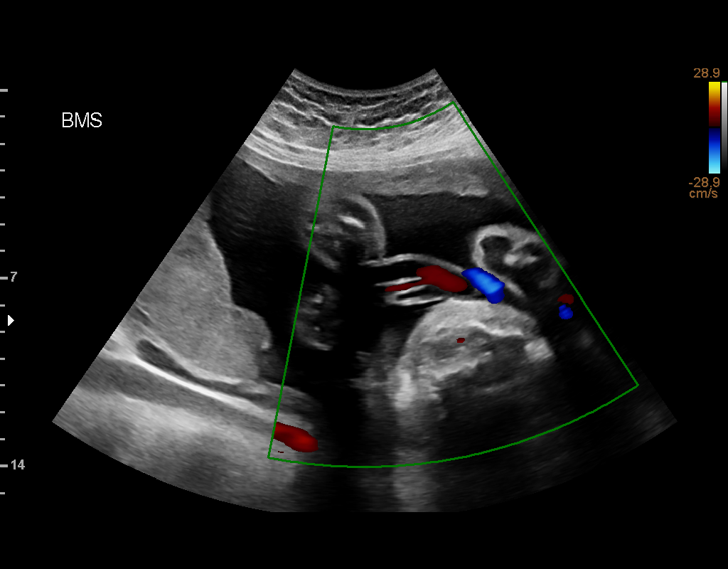

[13 of 28 positions shown; findings below may reference images not displayed]

----------------------------------------------------------------------

 ----------------------------------------------------------------------
Indications

  Maternal care for known or suspected poor
  fetal growth, second trimester, not applicable
  or unspecified IUGR
  Obesity complicating pregnancy, second
  trimester (pregravid BMI 34.37)
  28 weeks gestation of pregnancy
 ----------------------------------------------------------------------
Vital Signs

                                                Height:        5'0"
Fetal Evaluation

 Num Of Fetuses:         1
 Fetal Heart Rate(bpm):  146
 Cardiac Activity:       Observed
 Presentation:           Cephalic
 Placenta:               Posterior

 Amniotic Fluid
 AFI FV:      Within normal limits

 AFI Sum(cm)     %Tile       Largest Pocket(cm)
 16.57           61

 RUQ(cm)       RLQ(cm)       LUQ(cm)        LLQ(cm)

OB History
 Gravidity:    2         Term:   0        Prem:   0        SAB:   0
 TOP:          0       Ectopic:  1        Living: 0
Gestational Age

 LMP:           30w 0d        Date:  05/21/18                 EDD:   02/25/19
 Best:          28w 1d     Det. By:  Early Ultrasound         EDD:   03/10/19
                                     (07/20/18)
Doppler - Fetal Vessels

 Umbilical Artery
  S/D     %tile                                            ADFV    RDFV
 3.97       89                                                No      No

Impression

 Fetal growth restriction.
 Amniotic fluid is normal and good fetal activity is seen.
 Umbilical artery Doppler showed normal forward diastolic
 flow.  NST is reactive for this gestational age.
Recommendations

 -UA Doppler and NST next week.
                 Luedtke, Kavin

## 2020-09-18 ENCOUNTER — Ambulatory Visit: Payer: Medicaid Other | Admitting: Nurse Practitioner

## 2020-09-28 ENCOUNTER — Encounter: Payer: Self-pay | Admitting: Family

## 2020-09-28 ENCOUNTER — Other Ambulatory Visit: Payer: Self-pay

## 2020-09-28 ENCOUNTER — Other Ambulatory Visit (HOSPITAL_COMMUNITY)
Admission: RE | Admit: 2020-09-28 | Discharge: 2020-09-28 | Disposition: A | Discharge status: Home / Self Care | Payer: Medicaid Other | Source: Ambulatory Visit | Attending: Family | Admitting: Family

## 2020-09-28 ENCOUNTER — Ambulatory Visit (INDEPENDENT_AMBULATORY_CARE_PROVIDER_SITE_OTHER): Payer: Medicaid Other | Admitting: Family

## 2020-09-28 VITALS — BP 115/73 | HR 56 | Wt 192.8 lb

## 2020-09-28 DIAGNOSIS — B9689 Other specified bacterial agents as the cause of diseases classified elsewhere: Secondary | ICD-10-CM

## 2020-09-28 DIAGNOSIS — Z113 Encounter for screening for infections with a predominantly sexual mode of transmission: Secondary | ICD-10-CM | POA: Diagnosis not present

## 2020-09-28 DIAGNOSIS — R102 Pelvic and perineal pain: Secondary | ICD-10-CM | POA: Diagnosis not present

## 2020-09-28 DIAGNOSIS — N760 Acute vaginitis: Secondary | ICD-10-CM

## 2020-09-28 NOTE — Progress Notes (Signed)
History:  Ms. Kristina Alvarado is a 30 y.o. G2P1011 who presents to clinic today for gyn visit.  Reports some cramping around past csection scar.  Noticed after the csection, however desired to get it checked while here.   Also, notices some pinching around Nexplanon site that has increased over past 1-2 months.  Nexplanon has been in place x 1.5 years.  Last concern is heavy menstrual cycle.  Reports bleeding x 7 days in June and used approximately 12-14 pads per day, last 3-4 days; returned in 3 days, last 3 more days.    Feels increased stress due to starting two jobs.  Have been making self a priority by scheduling health appointments.  Have also scheduled a massage.   The following portions of the patient's history were reviewed and updated as appropriate: allergies, current medications, family history, past medical history, social history, past surgical history and problem list.  Review of Systems:  Review of Systems  Constitutional:  Negative for appetite change.  Respiratory: Negative.    Cardiovascular: Negative.   Gastrointestinal:  Abdominal pain: cramping; 8/10.  Genitourinary:  Positive for pelvic pain and vaginal bleeding. Negative for vaginal discharge.       Increased vaginal bleeding in June and July.  + intermittent lower pelvic cramping near csection site  Musculoskeletal:        Pinching around Nexplanon  Neurological:  Negative for dizziness and weakness.      Objective:  Physical Exam BP 115/73   Pulse (!) 56   Wt 192 lb 12.8 oz (87.5 kg)   LMP 08/06/2020   BMI 37.65 kg/m  Physical Exam BP 115/73   Pulse (!) 56   Wt 192 lb 12.8 oz (87.5 kg)   LMP 08/06/2020   BMI 37.65 kg/m  General appearance: alert, cooperative and appears stated age Head: Normocephalic, without obvious abnormality, atraumatic Neck: no adenopathy, no carotid bruit, no JVD, supple, symmetrical, trachea midline and thyroid not enlarged, symmetric, no tenderness/mass/nodules Lungs: clear  to auscultation bilaterally Heart: regular rate, 68 and rhythm, S1, S2 normal, no murmur, click, rub or gallop Abdomen: soft, non-tender; bowel sounds normal; no masses,  no organomegaly Pelvic: no adnexal masses or tenderness, no cervical motion tenderness, uterus normal size, shape Skin: Skin color, texture, turgor normal. No rashes or lesions ; Nexplanon palpated, normal position, no abnormalities palpated     Labs and Imaging No results found for this or any previous visit (from the past 24 hour(s)).  No results found.   Assessment & Plan:  1. Routine screening for STI (sexually transmitted infection) - GC & CT obtained; HIV, Hep B & C 2.  Nexplanon - Normal Placement 3.  Post surgical site pain - normal exam 4.  Increased Stress - Provided primary care resources - Advised to continue self-care activities, including scheduling of massage 5.  Smoking The patient was counseled on the dangers of tobacco use, and was advised to quit and referred to a tobacco cessation program.  Reviewed strategies to maximize success, including stress management and local smoking cessation programs (given with discharge instructions). Spent 5 minutes in counseling.  Amedeo Gory, CNM 09/28/2020 10:38 AM

## 2020-09-28 NOTE — Patient Instructions (Addendum)
Primary Care:  [email or web address]

## 2020-09-29 LAB — HEPATITIS C ANTIBODY: Hep C Virus Ab: <0.1 s/co ratio (ref 0.0–0.9)

## 2020-09-29 LAB — HEPATITIS B SURFACE ANTIGEN: Hepatitis B Surface Ag: NEGATIVE

## 2020-09-29 LAB — HIV ANTIBODY (ROUTINE TESTING W REFLEX): HIV Screen 4th Generation wRfx: NONREACTIVE

## 2020-09-29 LAB — RPR: RPR Ser Ql: NONREACTIVE

## 2020-10-01 LAB — CERVICOVAGINAL ANCILLARY ONLY
Bacterial Vaginitis (gardnerella): POSITIVE — AB
Candida Glabrata: NEGATIVE
Candida Vaginitis: NEGATIVE
Chlamydia: NEGATIVE
Comment: NEGATIVE
Comment: NEGATIVE
Comment: NEGATIVE
Comment: NEGATIVE
Comment: NEGATIVE
Comment: NORMAL
Neisseria Gonorrhea: NEGATIVE
Trichomonas: NEGATIVE

## 2020-10-02 MED ORDER — METRONIDAZOLE 0.75 % VA GEL: 1.0000 | Freq: Every day | VAGINAL | 1 refills | Status: DC

## 2020-10-02 NOTE — Addendum Note (Signed)
Addended by: Amedeo Gory on: 10/02/2020 10:17 PM   Modules accepted: Orders

## 2020-10-03 ENCOUNTER — Encounter: Payer: Medicaid Other | Admitting: Obstetrics and Gynecology

## 2021-04-15 ENCOUNTER — Ambulatory Visit: Payer: Medicaid Other | Admitting: Nurse Practitioner

## 2021-04-18 ENCOUNTER — Ambulatory Visit: Payer: Medicaid Other | Admitting: Family

## 2021-06-05 ENCOUNTER — Encounter: Payer: Medicaid Other | Admitting: Obstetrics and Gynecology

## 2021-06-26 ENCOUNTER — Ambulatory Visit: Payer: Medicaid Other | Admitting: Emergency Medicine

## 2021-06-26 ENCOUNTER — Telehealth: Payer: Self-pay

## 2021-06-26 ENCOUNTER — Encounter: Payer: Self-pay | Admitting: Emergency Medicine

## 2021-06-26 NOTE — Telephone Encounter (Signed)
Per Dr. Alvy Bimler due to [facility] he will not be accepting as new pt ?

## 2021-08-01 ENCOUNTER — Encounter: Payer: Self-pay | Admitting: Family Medicine

## 2021-08-01 ENCOUNTER — Ambulatory Visit: Payer: Medicaid Other | Admitting: Family Medicine

## 2021-08-01 ENCOUNTER — Other Ambulatory Visit: Payer: Self-pay | Admitting: Family Medicine

## 2021-08-01 VITALS — BP 112/80 | HR 68 | Temp 97.8°F | Ht 60.0 in | Wt 194.0 lb

## 2021-08-01 DIAGNOSIS — Z7689 Persons encountering health services in other specified circumstances: Secondary | ICD-10-CM

## 2021-08-01 DIAGNOSIS — R5383 Other fatigue: Secondary | ICD-10-CM | POA: Diagnosis not present

## 2021-08-01 DIAGNOSIS — M545 Low back pain, unspecified: Secondary | ICD-10-CM | POA: Diagnosis not present

## 2021-08-01 DIAGNOSIS — E559 Vitamin D deficiency, unspecified: Secondary | ICD-10-CM

## 2021-08-01 DIAGNOSIS — L853 Xerosis cutis: Secondary | ICD-10-CM | POA: Diagnosis not present

## 2021-08-01 HISTORY — DX: Xerosis cutis: L85.3

## 2021-08-01 HISTORY — DX: Vitamin D deficiency, unspecified: E55.9

## 2021-08-01 LAB — COMPREHENSIVE METABOLIC PANEL
ALT: 24 U/L (ref 0–35)
AST: 22 U/L (ref 0–37)
Albumin: 4.2 g/dL (ref 3.5–5.2)
Alkaline Phosphatase: 52 U/L (ref 39–117)
BUN: 13 mg/dL (ref 6–23)
CO2: 28 mEq/L (ref 19–32)
Calcium: 9.7 mg/dL (ref 8.4–10.5)
Chloride: 107 mEq/L (ref 96–112)
Creatinine, Ser: 0.92 mg/dL (ref 0.40–1.20)
GFR: 84.58 mL/min (ref >60.00)
Glucose, Bld: 72 mg/dL (ref 70–99)
Potassium: 4.5 mEq/L (ref 3.5–5.1)
Sodium: 140 mEq/L (ref 135–145)
Total Bilirubin: 0.3 mg/dL (ref 0.2–1.2)
Total Protein: 7.3 g/dL (ref 6.0–8.3)

## 2021-08-01 LAB — CBC WITH DIFFERENTIAL/PLATELET
Basophils Absolute: 0.1 10*3/uL (ref 0.0–0.1)
Basophils Relative: 1.1 % (ref 0.0–3.0)
Eosinophils Absolute: 0.2 10*3/uL (ref 0.0–0.7)
Eosinophils Relative: 3.7 % (ref 0.0–5.0)
HCT: 38.6 % (ref 36.0–46.0)
Hemoglobin: 12.8 g/dL (ref 12.0–15.0)
Lymphocytes Relative: 29.7 % (ref 12.0–46.0)
Lymphs Abs: 1.7 10*3/uL (ref 0.7–4.0)
MCHC: 33.2 g/dL (ref 30.0–36.0)
MCV: 87.7 fl (ref 78.0–100.0)
Monocytes Absolute: 0.5 10*3/uL (ref 0.1–1.0)
Monocytes Relative: 9.3 % (ref 3.0–12.0)
Neutro Abs: 3.2 10*3/uL (ref 1.4–7.7)
Neutrophils Relative %: 56.2 % (ref 43.0–77.0)
Platelets: 199 10*3/uL (ref 150.0–400.0)
RBC: 4.4 Mil/uL (ref 3.87–5.11)
RDW: 14.8 % (ref 11.5–15.5)
WBC: 5.7 10*3/uL (ref 4.0–10.5)

## 2021-08-01 LAB — VITAMIN B12: Vitamin B-12: 380 pg/mL (ref 211–911)

## 2021-08-01 LAB — TSH: TSH: 0.31 u[IU]/mL — ABNORMAL LOW (ref 0.35–5.50)

## 2021-08-01 LAB — T4, FREE: Free T4: 0.98 ng/dL (ref 0.60–1.60)

## 2021-08-01 LAB — VITAMIN D 25 HYDROXY (VIT D DEFICIENCY, FRACTURES): VITD: 16.32 ng/mL — ABNORMAL LOW (ref 30.00–100.00)

## 2021-08-01 MED ORDER — VITAMIN D (ERGOCALCIFEROL) 1.25 MG (50000 UNIT) PO CAPS: 50000.0000 [IU] | ORAL_CAPSULE | ORAL | 0 refills | Status: DC

## 2021-08-01 MED ORDER — MELOXICAM 15 MG PO TABS: 15.0000 mg | ORAL_TABLET | Freq: Every day | ORAL | 0 refills | Status: DC

## 2021-08-01 MED ORDER — METHOCARBAMOL 500 MG PO TABS: 500.0000 mg | ORAL_TABLET | Freq: Three times a day (TID) | ORAL | 0 refills | Status: DC | PRN

## 2021-08-01 NOTE — Progress Notes (Signed)
New Patient Office Visit  Subjective    Patient ID: Kristina Alvarado, female    DOB: April 16, 1990  Age: 31 y.o. MRN: [id number]  CC:  Chief Complaint  Patient presents with   Establish Care    Past 2 weeks lower back has been bothering her and has been feeling very fatigued for past couple months.   Been 2 yrs since been to Dr.     Sula Rumple Sandria Manly Alvarado presents to establish care.   Usually sees her OB/GYN   Complains of low back pain x 2 weeks ago. No known injury. Gradually worsening. Left low back pain that occasionally radiates across her low back. Constant pain.  Laying on her left side feels better. Hyperextending, ending and twisting makes her pain worse.   Biofreeze. Taking Advil.   Manager at [employer].   Complains of a 2 month hx of fatigue. Non specific. Denies depression. States she is not sleeping well.  Noticed dry skin recently as well.   Denies fever, chills, dizziness, chest pain, palpitations, shortness of breath, abdominal pain, N/V/D, urinary symptoms.    LMP:  not since Nexplanon.  She has a 29 year old. is not breastfeeding   Drinks alcohol occasionally and smokes marijuana.    Outpatient Encounter Medications as of 08/01/2021  Medication Sig   meloxicam (MOBIC) 15 MG tablet Take 1 tablet (15 mg total) by mouth daily.   methocarbamol (ROBAXIN) 500 MG tablet Take 1 tablet (500 mg total) by mouth every 8 (eight) hours as needed for muscle spasms.   [DISCONTINUED] acetaminophen (TYLENOL) 325 MG tablet Take 2 tablets (650 mg total) by mouth every 6 (six) hours as needed for mild pain (temperature > 101.5.). (Patient not taking: No sig reported)   [DISCONTINUED] ibuprofen (ADVIL) 800 MG tablet Take 1 tablet (800 mg total) by mouth every 8 (eight) hours. (Patient not taking: No sig reported)   [DISCONTINUED] metroNIDAZOLE (METROGEL) 0.75 % vaginal gel Place 1 Applicatorful vaginally at bedtime. Apply one applicatorful to vagina at bedtime for 5 days  (Patient not taking: Reported on 08/01/2021)   No facility-administered encounter medications on file as of 08/01/2021.    Past Medical History:  Diagnosis Date   Anemia    History of ectopic pregnancy 2019    Past Surgical History:  Procedure Laterality Date   CESAREAN SECTION N/A 02/17/2019   Procedure: CESAREAN SECTION;  Surgeon: Catalina Antigua, MD;  Location: [facility];  Service: Obstetrics;  Laterality: N/A;   DIAGNOSTIC LAPAROSCOPY WITH REMOVAL OF ECTOPIC PREGNANCY N/A 09/10/2017   Procedure: DIAGNOSTIC LAPAROSCOPY WITH REMOVAL OF ECTOPIC PREGNANCY RIGHT SALPENGECTOMY RIGHT;  Surgeon: Tereso Newcomer, MD;  Location: [facility];  Service: Gynecology;  Laterality: N/A;    Family History  Problem Relation Age of Onset   Asthma Mother    Hypertension Mother    Diabetes Mother        takes insulin   Asthma Father    Heart disease Maternal Aunt    Heart disease Maternal Grandmother     Social History   Socioeconomic History   Marital status: Single    Spouse name: Not on file   Number of children: Not on file   Years of education: Not on file   Highest education level: Not on file  Occupational History    Comment: [employer]  Tobacco Use   Smoking status: Every Day    Types: Cigars   Smokeless tobacco: Never   Tobacco comments:    Recently restarted due  to stress 09/28/20  Vaping Use   Vaping Use: Never used  Substance and Sexual Activity   Alcohol use: Not Currently    Comment: not since pregnancy   Drug use: Yes    Types: Marijuana   Sexual activity: Yes    Partners: Male    Birth control/protection: Implant  Other Topics Concern   Not on file  Social History Narrative   Not on file   Social Determinants of Health   Financial Resource Strain: Not on file  Food Insecurity: No Food Insecurity (09/28/2020)   Hunger Vital Sign    Worried About Running Out of Food in the Last Year: Never true    Ran Out of Food in the Last Year: Never true  Transportation  Needs: No Transportation Needs (09/28/2020)   PRAPARE - Administrator, Civil Service (Medical): No    Lack of Transportation (Non-Medical): No  Physical Activity: Not on file  Stress: Stress Concern Present (09/28/2020)   [employer] of Occupational Health - Occupational Stress Questionnaire    Feeling of Stress : Rather much  Social Connections: Not on file  Intimate Partner Violence: Not At Risk (07/21/2018)   Humiliation, Afraid, Rape, and Kick questionnaire    Fear of Current or Ex-Partner: No    Emotionally Abused: No    Physically Abused: No    Sexually Abused: No    ROS Pertinent positives and negatives in the history of present illness.      Objective    BP 112/80 (BP Location: Left Arm, Patient Position: Sitting, Cuff Size: Large)   Pulse 68   Temp 97.8 F (36.6 C) (Temporal)   Ht 5' (1.524 m)   Wt 194 lb (88 kg)   Breastfeeding No   BMI 37.89 kg/m   Physical Exam Constitutional:      General: She is not in acute distress.    Appearance: She is not ill-appearing.  HENT:     Mouth/Throat:     Mouth: Mucous membranes are moist.  Eyes:     Conjunctiva/sclera: Conjunctivae normal.     Pupils: Pupils are equal, round, and reactive to light.  Cardiovascular:     Rate and Rhythm: Normal rate and regular rhythm.     Pulses: Normal pulses.  Pulmonary:     Effort: Pulmonary effort is normal.     Breath sounds: Normal breath sounds.  Musculoskeletal:     Cervical back: Normal, normal range of motion and neck supple.     Thoracic back: Normal.     Lumbar back: Tenderness present. No bony tenderness. Decreased range of motion.     Comments: TTP over left piriformis muscle. Good sensation and ROM of back. Pain present with extension, bending, twisting.   Skin:    General: Skin is warm and dry.  Neurological:     General: No focal deficit present.     Mental Status: She is alert and oriented to person, place, and time.     Cranial Nerves: No  cranial nerve deficit.     Sensory: No sensory deficit.     Motor: No weakness.  Psychiatric:        Mood and Affect: Mood normal.        Thought Content: Thought content normal.         Assessment & Plan:   Problem List Items Addressed This Visit       Musculoskeletal and Integument   Dry skin    Use a good  moisturizer. Check labs including TSH.       Relevant Orders   TSH   T4, free     Other   Acute left-sided low back pain without sciatica - Primary    No known injury.  Discussed conservative treatment for low back pain including NSAIDs.  Meloxicam prescribed.  Recommend using heat to the area, topical analgesics over-the-counter, gentle stretches which I demonstrated and printed for her.  I also prescribed Robaxin and advised her this medication is for moderate to severe pain and is sedating.  Follow-up if worsening or any new symptoms arise.  Consider PT referral or sports medicine referral if needed.      Relevant Medications   meloxicam (MOBIC) 15 MG tablet   methocarbamol (ROBAXIN) 500 MG tablet   Fatigue    Discussed potential etiologies for fatigue.  She does have a history of anemia and abnormal TSH.  No sign of infectious process and no acute distress.  Recommend eating a healthy diet and getting regular sleep.  Check labs and follow-up.      Relevant Orders   CBC with Differential/Platelet   Comprehensive metabolic panel   TSH   T4, free   VITAMIN D 25 Hydroxy (Vit-D Deficiency, Fractures)   Vitamin B12   Other Visit Diagnoses     Encounter to establish care           Return for pending labs.   Hetty Blend, NP-C

## 2021-08-01 NOTE — Assessment & Plan Note (Signed)
Use a good moisturizer. Check labs including TSH.

## 2021-08-01 NOTE — Patient Instructions (Signed)
Take the meloxicam as prescribed.  Do not take any over-the-counter pain medications.  Use heat on your left low back.  You can also use topical pain medicine such as Biofreeze, Salonpas with lidocaine, etc.  These are all over-the-counter.  Do gentle back stretches  You may take the muscle relaxant as needed but be aware this medication is sedating.  Follow-up if you are not feeling better in the next 2 to 3 weeks or sooner if you are getting worse.  We will be in touch with your lab results.  Follow-up with your OB/GYN.   Back Exercises The following exercises strengthen the muscles that help to support the trunk (torso) and back. They also help to keep the lower back flexible. Doing these exercises can help to prevent or lessen existing low back pain. If you have back pain or discomfort, try doing these exercises 2-3 times each day or as told by your health care provider. As your pain improves, do them once each day, but increase the number of times that you repeat the steps for each exercise (do more repetitions). To prevent the recurrence of back pain, continue to do these exercises once each day or as told by your health care provider. Do exercises exactly as told by your health care provider and adjust them as directed. It is normal to feel mild stretching, pulling, tightness, or discomfort as you do these exercises, but you should stop right away if you feel sudden pain or your pain gets worse. Exercises Single knee to chest Repeat these steps 3-5 times for each leg: Lie on your back on a firm bed or the floor with your legs extended. Bring one knee to your chest. Your other leg should stay extended and in contact with the floor. Hold your knee in place by grabbing your knee or thigh with both hands and hold. Pull on your knee until you feel a gentle stretch in your lower back or buttocks. Hold the stretch for 10-30 seconds. Slowly release and straighten your leg.  Pelvic  tilt Repeat these steps 5-10 times: Lie on your back on a firm bed or the floor with your legs extended. Bend your knees so they are pointing toward the ceiling and your feet are flat on the floor. Tighten your lower abdominal muscles to press your lower back against the floor. This motion will tilt your pelvis so your tailbone points up toward the ceiling instead of pointing to your feet or the floor. With gentle tension and even breathing, hold this position for 5-10 seconds.  Cat-cow Repeat these steps until your lower back becomes more flexible: Get into a hands-and-knees position on a firm bed or the floor. Keep your hands under your shoulders, and keep your knees under your hips. You may place padding under your knees for comfort. Let your head hang down toward your chest. Contract your abdominal muscles and point your tailbone toward the floor so your lower back becomes rounded like the back of a cat. Hold this position for 5 seconds. Slowly lift your head, let your abdominal muscles relax, and point your tailbone up toward the ceiling so your back forms a sagging arch like the back of a cow. Hold this position for 5 seconds.  Press-ups Repeat these steps 5-10 times: Lie on your abdomen (face-down) on a firm bed or the floor. Place your palms near your head, about shoulder-width apart. Keeping your back as relaxed as possible and keeping your hips on the floor,  slowly straighten your arms to raise the top half of your body and lift your shoulders. Do not use your back muscles to raise your upper torso. You may adjust the placement of your hands to make yourself more comfortable. Hold this position for 5 seconds while you keep your back relaxed. Slowly return to lying flat on the floor.  Bridges Repeat these steps 10 times: Lie on your back on a firm bed or the floor. Bend your knees so they are pointing toward the ceiling and your feet are flat on the floor. Your arms should be flat  at your sides, next to your body. Tighten your buttocks muscles and lift your buttocks off the floor until your waist is at almost the same height as your knees. You should feel the muscles working in your buttocks and the back of your thighs. If you do not feel these muscles, slide your feet 1-2 inches (2.5-5 cm) farther away from your buttocks. Hold this position for 3-5 seconds. Slowly lower your hips to the starting position, and allow your buttocks muscles to relax completely. If this exercise is too easy, try doing it with your arms crossed over your chest. Abdominal crunches Repeat these steps 5-10 times: Lie on your back on a firm bed or the floor with your legs extended. Bend your knees so they are pointing toward the ceiling and your feet are flat on the floor. Cross your arms over your chest. Tip your chin slightly toward your chest without bending your neck. Tighten your abdominal muscles and slowly raise your torso high enough to lift your shoulder blades a tiny bit off the floor. Avoid raising your torso higher than that because it can put too much stress on your lower back and does not help to strengthen your abdominal muscles. Slowly return to your starting position.  Back lifts Repeat these steps 5-10 times: Lie on your abdomen (face-down) with your arms at your sides, and rest your forehead on the floor. Tighten the muscles in your legs and your buttocks. Slowly lift your chest off the floor while you keep your hips pressed to the floor. Keep the back of your head in line with the curve in your back. Your eyes should be looking at the floor. Hold this position for 3-5 seconds. Slowly return to your starting position.  Contact a health care provider if: Your back pain or discomfort gets much worse when you do an exercise. Your worsening back pain or discomfort does not lessen within 2 hours after you exercise. If you have any of these problems, stop doing these exercises  right away. Do not do them again unless your health care provider says that you can. Get help right away if: You develop sudden, severe back pain. If this happens, stop doing the exercises right away. Do not do them again unless your health care provider says that you can. This information is not intended to replace advice given to you by your health care provider. Make sure you discuss any questions you have with your health care provider. Document Revised: 07/24/2020 Document Reviewed: 04/11/2020 Elsevier Patient Education  [place].       Fatigue If you have fatigue, you feel tired all the time and have a lack of energy or a lack of motivation. Fatigue may make it difficult to start or complete tasks because of exhaustion. Occasional or mild fatigue is often a normal response to activity or life. However, long-term (chronic) or extreme fatigue  may be a symptom of a medical condition such as: Depression. Not having enough red blood cells or hemoglobin in the blood (anemia). A problem with a small gland located in the lower front part of the neck (thyroid disorder). Rheumatologic conditions. These are problems related to the body's defense system (immune system). Infections, especially certain viral infections. Fatigue can also lead to negative health outcomes over time. Follow these instructions at home: Medicines Take over-the-counter and prescription medicines only as told by your health care provider. Take a multivitamin if told by your health care provider. Do not use herbal or dietary supplements unless they are approved by your health care provider. Eating and drinking  Avoid heavy meals in the evening. Eat a well-balanced diet, which includes lean proteins, whole grains, plenty of fruits and vegetables, and low-fat dairy products. Avoid eating or drinking too many products with caffeine in them. Avoid alcohol. Drink enough fluid to keep your urine pale  yellow. Activity  Exercise regularly, as told by your health care provider. Use or practice techniques to help you relax, such as yoga, tai chi, meditation, or massage therapy. Lifestyle Change situations that cause you stress. Try to keep your work and personal schedules in balance. Do not use recreational or illegal drugs. General instructions Monitor your fatigue for any changes. Go to bed and get up at the same time every day. Avoid fatigue by pacing yourself during the day and getting enough sleep at night. Maintain a healthy weight. Contact a health care provider if: Your fatigue does not get better. You have a fever. You suddenly lose or gain weight. You have headaches. You have trouble falling asleep or sleeping through the night. You feel angry, guilty, anxious, or sad. You have swelling in your legs or another part of your body. Get help right away if: You feel confused, feel like you might faint, or faint. Your vision is blurry or you have a severe headache. You have severe pain in your abdomen, your back, or the area between your waist and hips (pelvis). You have chest pain, shortness of breath, or an irregular or fast heartbeat. You are unable to urinate, or you urinate less than normal. You have abnormal bleeding from the rectum, nose, lungs, nipples, or, if you are female, the vagina. You vomit blood. You have thoughts about hurting yourself or others. These symptoms may be an emergency. Get help right away. Call 911. Do not wait to see if the symptoms will go away. Do not drive yourself to the hospital. Get help right away if you feel like you may hurt yourself or others, or have thoughts about taking your own life. Go to your nearest emergency room or: Call 911. Call the [facility] at ([phone] or 988. This is open 24 hours a day. Text the Crisis Text Line at 352-089-6123. Summary If you have fatigue, you feel tired all the time and have  a lack of energy or a lack of motivation. Fatigue may make it difficult to start or complete tasks because of exhaustion. Long-term (chronic) or extreme fatigue may be a symptom of a medical condition. Exercise regularly, as told by your health care provider. Change situations that cause you stress. Try to keep your work and personal schedules in balance. This information is not intended to replace advice given to you by your health care provider. Make sure you discuss any questions you have with your health care provider. Document Revised: 11/19/2020 Document Reviewed: 11/19/2020 Elsevier Patient  Education  [place].

## 2021-08-01 NOTE — Assessment & Plan Note (Signed)
Discussed potential etiologies for fatigue.  She does have a history of anemia and abnormal TSH.  No sign of infectious process and no acute distress.  Recommend eating a healthy diet and getting regular sleep.  Check labs and follow-up.

## 2021-08-01 NOTE — Assessment & Plan Note (Signed)
No known injury.  Discussed conservative treatment for low back pain including NSAIDs.  Meloxicam prescribed.  Recommend using heat to the area, topical analgesics over-the-counter, gentle stretches which I demonstrated and printed for her.  I also prescribed Robaxin and advised her this medication is for moderate to severe pain and is sedating.  Follow-up if worsening or any new symptoms arise.  Consider PT referral or sports medicine referral if needed.

## 2021-08-28 ENCOUNTER — Other Ambulatory Visit: Payer: Self-pay | Admitting: Family Medicine

## 2021-08-28 DIAGNOSIS — M545 Low back pain, unspecified: Secondary | ICD-10-CM

## 2021-09-23 ENCOUNTER — Other Ambulatory Visit: Payer: Self-pay | Admitting: Family Medicine

## 2021-09-23 DIAGNOSIS — E559 Vitamin D deficiency, unspecified: Secondary | ICD-10-CM

## 2021-12-26 ENCOUNTER — Ambulatory Visit: Payer: Medicaid Other | Admitting: Family Medicine

## 2021-12-26 ENCOUNTER — Encounter: Payer: Self-pay | Admitting: Family Medicine

## 2021-12-26 VITALS — BP 116/68 | HR 85 | Temp 97.8°F | Ht 60.0 in | Wt 196.0 lb

## 2021-12-26 DIAGNOSIS — K6289 Other specified diseases of anus and rectum: Secondary | ICD-10-CM | POA: Diagnosis not present

## 2021-12-26 DIAGNOSIS — K625 Hemorrhage of anus and rectum: Secondary | ICD-10-CM

## 2021-12-26 LAB — CBC WITH DIFFERENTIAL/PLATELET
Basophils Absolute: 0.1 10*3/uL (ref 0.0–0.1)
Basophils Relative: 0.9 % (ref 0.0–3.0)
Eosinophils Absolute: 0.3 10*3/uL (ref 0.0–0.7)
Eosinophils Relative: 4.4 % (ref 0.0–5.0)
HCT: 38.1 % (ref 36.0–46.0)
Hemoglobin: 12.8 g/dL (ref 12.0–15.0)
Lymphocytes Relative: 32.9 % (ref 12.0–46.0)
Lymphs Abs: 2.5 10*3/uL (ref 0.7–4.0)
MCHC: 33.6 g/dL (ref 30.0–36.0)
MCV: 87.4 fl (ref 78.0–100.0)
Monocytes Absolute: 0.7 10*3/uL (ref 0.1–1.0)
Monocytes Relative: 9.2 % (ref 3.0–12.0)
Neutro Abs: 4 10*3/uL (ref 1.4–7.7)
Neutrophils Relative %: 52.6 % (ref 43.0–77.0)
Platelets: 216 10*3/uL (ref 150.0–400.0)
RBC: 4.36 Mil/uL (ref 3.87–5.11)
RDW: 14.2 % (ref 11.5–15.5)
WBC: 7.7 10*3/uL (ref 4.0–10.5)

## 2021-12-26 MED ORDER — HYDROCORTISONE ACETATE 25 MG RE SUPP: 25.0000 mg | Freq: Two times a day (BID) | RECTAL | 0 refills | Status: DC

## 2021-12-26 MED ORDER — LIDOCAINE 5 % EX OINT: 1.0000 | TOPICAL_OINTMENT | CUTANEOUS | 0 refills | Status: DC | PRN

## 2021-12-26 NOTE — Patient Instructions (Addendum)
Please go downstairs for labs before you leave today.  Use the suppositories as prescribed and lidocaine gel as needed for rectal pain.  The lidocaine gel works best if you use it approximately 30 minutes before having a bowel movement.  I also recommend doing warm sitz bath's with Epsom salt.  Keep your stools soft.  You can take stool softeners or MiraLAX if needed.  Make sure you are getting plenty of fiber and water in your diet.   I placed a referral to gastroenterology and they will call you to schedule a visit.

## 2021-12-26 NOTE — Progress Notes (Signed)
Subjective:     Patient ID: Kristina Alvarado, female    DOB: 08/02/1990, 31 y.o.   MRN: [id number]  Chief Complaint  Patient presents with   Hemorrhoids    Think she may have multiple, first noticed 1st one a year ago but has gotten worse the past couple months    HPI Patient is in today for rectal pain, bright red blood on toilet tissue with bowel movements. Worsening over the past year. States she has hemorrhoids.  Itching and burning after bowel movements.   Seeing blood even with soft bowel movements. Having bowel movements twice daily.   Denies fever, chills, weight loss, dizziness, chest pain, palpitations, shortness of breath, abdominal pain, N/V/D, urinary symptoms.    Health Maintenance Due  Topic Date Due   COVID-19 Vaccine (1) Never done   PAP-Cervical Cytology Screening  12/17/2020   PAP SMEAR-Modifier  12/17/2020   INFLUENZA VACCINE  09/10/2021    Past Medical History:  Diagnosis Date   Anemia    History of ectopic pregnancy 2019   Vitamin D deficiency 08/01/2021    Past Surgical History:  Procedure Laterality Date   CESAREAN SECTION N/A 02/17/2019   Procedure: CESAREAN SECTION;  Surgeon: Catalina Antigua, MD;  Location: [facility];  Service: Obstetrics;  Laterality: N/A;   DIAGNOSTIC LAPAROSCOPY WITH REMOVAL OF ECTOPIC PREGNANCY N/A 09/10/2017   Procedure: DIAGNOSTIC LAPAROSCOPY WITH REMOVAL OF ECTOPIC PREGNANCY RIGHT SALPENGECTOMY RIGHT;  Surgeon: Tereso Newcomer, MD;  Location: [facility];  Service: Gynecology;  Laterality: N/A;    Family History  Problem Relation Age of Onset   Asthma Mother    Hypertension Mother    Diabetes Mother        takes insulin   Asthma Father    Heart disease Maternal Aunt    Heart disease Maternal Grandmother     Social History   Socioeconomic History   Marital status: Single    Spouse name: Not on file   Number of children: Not on file   Years of education: Not on file   Highest education level: Not on file   Occupational History    Comment: [employer]  Tobacco Use   Smoking status: Every Day    Types: Cigars   Smokeless tobacco: Never   Tobacco comments:    Recently restarted due to stress 09/28/20  Vaping Use   Vaping Use: Never used  Substance and Sexual Activity   Alcohol use: Not Currently    Comment: not since pregnancy   Drug use: Yes    Types: Marijuana   Sexual activity: Yes    Partners: Male    Birth control/protection: Implant  Other Topics Concern   Not on file  Social History Narrative   Not on file   Social Determinants of Health   Financial Resource Strain: Not on file  Food Insecurity: No Food Insecurity (09/28/2020)   Hunger Vital Sign    Worried About Running Out of Food in the Last Year: Never true    Ran Out of Food in the Last Year: Never true  Transportation Needs: No Transportation Needs (09/28/2020)   PRAPARE - Administrator, Civil Service (Medical): No    Lack of Transportation (Non-Medical): No  Physical Activity: Not on file  Stress: Stress Concern Present (09/28/2020)   [employer] of Occupational Health - Occupational Stress Questionnaire    Feeling of Stress : Rather much  Social Connections: Not on file  Intimate Partner Violence: Not At  Risk (07/21/2018)   Humiliation, Afraid, Rape, and Kick questionnaire    Fear of Current or Ex-Partner: No    Emotionally Abused: No    Physically Abused: No    Sexually Abused: No    Outpatient Medications Prior to Visit  Medication Sig Dispense Refill   meloxicam (MOBIC) 15 MG tablet TAKE 1 TABLET(15 MG) BY MOUTH DAILY 30 tablet 0   methocarbamol (ROBAXIN) 500 MG tablet Take 1 tablet (500 mg total) by mouth every 8 (eight) hours as needed for muscle spasms. 30 tablet 0   Vitamin D, Ergocalciferol, (DRISDOL) 1.25 MG (50000 UNIT) CAPS capsule Take 1 capsule (50,000 Units total) by mouth every 7 (seven) days. 8 capsule 0   No facility-administered medications prior to visit.    No Known  Allergies  ROS     Objective:    Physical Exam Exam conducted with a chaperone present.  Constitutional:      General: She is not in acute distress.    Appearance: She is not ill-appearing.  Cardiovascular:     Rate and Rhythm: Normal rate and regular rhythm.  Pulmonary:     Effort: Pulmonary effort is normal.     Breath sounds: Normal breath sounds.  Abdominal:     General: Bowel sounds are normal. There is no distension.     Palpations: Abdomen is soft.     Tenderness: There is no abdominal tenderness. There is no guarding or rebound.  Genitourinary:    Rectum: Tenderness present.     Comments: Unable to perform DRE due to patient pain and discomfort. No obvious external hemorrhoids.  Skin:    General: Skin is warm and dry.     Coloration: Skin is not pale.  Neurological:     General: No focal deficit present.     Mental Status: She is alert and oriented to person, place, and time.  Psychiatric:        Mood and Affect: Mood normal.        Behavior: Behavior normal.     BP 116/68 (BP Location: Left Arm, Patient Position: Sitting, Cuff Size: Large)   Pulse 85   Temp 97.8 F (36.6 C) (Temporal)   Ht 5' (1.524 m)   Wt 196 lb (88.9 kg)   SpO2 98%   BMI 38.28 kg/m  Wt Readings from Last 3 Encounters:  12/26/21 196 lb (88.9 kg)  08/01/21 194 lb (88 kg)  09/28/20 192 lb 12.8 oz (87.5 kg)       Assessment & Plan:   Problem List Items Addressed This Visit   None Visit Diagnoses     Rectal pain    -  Primary   Relevant Medications   hydrocortisone (ANUSOL-HC) 25 MG suppository   lidocaine (XYLOCAINE) 5 % ointment   Other Relevant Orders   Ambulatory referral to Gastroenterology   BRBPR (bright red blood per rectum)       Relevant Orders   CBC with Differential/Platelet   Ambulatory referral to Gastroenterology      Treat for hemorrhoids and fissure. Discussed keeping stools soft and avoid straining. Recommend sitz baths. Check CBC. Referral to GI.   I  am having Kristina Alvarado start on hydrocortisone and lidocaine. I am also having her maintain her methocarbamol, Vitamin D (Ergocalciferol), and meloxicam.  Meds ordered this encounter  Medications   hydrocortisone (ANUSOL-HC) 25 MG suppository    Sig: Place 1 suppository (25 mg total) rectally 2 (two) times daily.    Dispense:  12  suppository    Refill:  0    Order Specific Question:   Supervising Provider    Answer:   Pricilla Holm A [[id number]]   lidocaine (XYLOCAINE) 5 % ointment    Sig: Apply 1 Application topically as needed.    Dispense:  35.44 g    Refill:  0    Order Specific Question:   Supervising Provider    Answer:   Pricilla Holm A [id number]

## 2022-01-27 ENCOUNTER — Encounter (HOSPITAL_COMMUNITY): Payer: Self-pay

## 2022-01-27 ENCOUNTER — Emergency Department (HOSPITAL_COMMUNITY): Payer: Medicaid Other

## 2022-01-27 ENCOUNTER — Other Ambulatory Visit: Payer: Self-pay

## 2022-01-27 ENCOUNTER — Emergency Department (HOSPITAL_COMMUNITY)
Admission: EM | Admit: 2022-01-27 | Discharge: 2022-01-27 | Discharge status: Against Medical Advice | Payer: Medicaid Other | Attending: Emergency Medicine | Admitting: Emergency Medicine

## 2022-01-27 DIAGNOSIS — M545 Low back pain, unspecified: Secondary | ICD-10-CM | POA: Diagnosis not present

## 2022-01-27 DIAGNOSIS — Y92410 Unspecified street and highway as the place of occurrence of the external cause: Secondary | ICD-10-CM | POA: Diagnosis not present

## 2022-01-27 DIAGNOSIS — M25571 Pain in right ankle and joints of right foot: Secondary | ICD-10-CM | POA: Insufficient documentation

## 2022-01-27 DIAGNOSIS — Z5321 Procedure and treatment not carried out due to patient leaving prior to being seen by health care provider: Secondary | ICD-10-CM | POA: Diagnosis not present
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
## 2022-01-27 NOTE — ED Notes (Signed)
eloped

## 2022-01-30 ENCOUNTER — Ambulatory Visit: Payer: Medicaid Other | Admitting: Family Medicine

## 2022-03-18 ENCOUNTER — Other Ambulatory Visit (HOSPITAL_COMMUNITY)
Admission: RE | Admit: 2022-03-18 | Discharge: 2022-03-18 | Disposition: A | Discharge status: Home / Self Care | Payer: Medicaid Other | Source: Ambulatory Visit | Attending: Obstetrics and Gynecology | Admitting: Obstetrics and Gynecology

## 2022-03-18 ENCOUNTER — Encounter: Payer: Self-pay | Admitting: Obstetrics and Gynecology

## 2022-03-18 ENCOUNTER — Ambulatory Visit (INDEPENDENT_AMBULATORY_CARE_PROVIDER_SITE_OTHER): Payer: Medicaid Other | Admitting: Obstetrics and Gynecology

## 2022-03-18 ENCOUNTER — Other Ambulatory Visit: Payer: Self-pay

## 2022-03-18 VITALS — BP 136/97 | HR 70 | Wt 194.1 lb

## 2022-03-18 DIAGNOSIS — Z124 Encounter for screening for malignant neoplasm of cervix: Secondary | ICD-10-CM

## 2022-03-18 DIAGNOSIS — Z113 Encounter for screening for infections with a predominantly sexual mode of transmission: Secondary | ICD-10-CM | POA: Diagnosis not present

## 2022-03-18 DIAGNOSIS — Z01419 Encounter for gynecological examination (general) (routine) without abnormal findings: Secondary | ICD-10-CM | POA: Diagnosis not present

## 2022-03-18 DIAGNOSIS — Z3046 Encounter for surveillance of implantable subdermal contraceptive: Secondary | ICD-10-CM | POA: Diagnosis not present

## 2022-03-18 DIAGNOSIS — K6289 Other specified diseases of anus and rectum: Secondary | ICD-10-CM

## 2022-03-18 MED ORDER — HYDROCORTISONE ACETATE 25 MG RE SUPP: 25.0000 mg | Freq: Two times a day (BID) | RECTAL | 0 refills | Status: DC

## 2022-03-18 NOTE — Patient Instructions (Addendum)
Contact [facility]  to scheduled consult visit for hemorrhoids  Goal is soft, formed stools  Annusol sent to [facility] to help with hemorrhoids. Use sitz baths.

## 2022-03-18 NOTE — Progress Notes (Signed)
ANNUAL EXAM Patient name: Kristina Alvarado MRN [id number]  Date of birth: January 01, 1991 Chief Complaint:   Gynecologic Exam  History of Present Illness:   Kristina Alvarado is a 32 y.o. G2P1011 being seen today for a routine annual exam.  Current complaints: nexplanon removal  Nexplanon in place since 02/2019 and would like removal. Will use condoms - may opt to have another child. No menses with nexplanon in place. No urinary or bowel issues. Notes having hemorrhoid (per mother and PCP note) - has not seen improvement w/ lidocaine and pharm ran out of annusol. Has intermittent constipation. No urinary or breast concerns.   No LMP recorded. Patient has had an implant.   The pregnancy intention screening data noted above was reviewed. Potential methods of contraception were discussed. The patient elected to proceed with No data recorded.   Last pap      Component Value Date/Time   DIAGPAP  12/17/2017 0000    NEGATIVE FOR INTRAEPITHELIAL LESIONS OR MALIGNANCY.   DIAGPAP  12/17/2017 0000    FUNGAL ORGANISMS PRESENT CONSISTENT WITH CANDIDA SPP.   ADEQPAP  12/17/2017 0000    Satisfactory for evaluation  endocervical/transformation zone component PRESENT.  \ Last mammogram: n/a Last colonoscopy: n/a     08/01/2021    9:25 AM 09/28/2020    9:58 AM 09/13/2018    8:35 AM 09/13/2018    8:28 AM 10/08/2017   10:46 AM  Depression screen PHQ 2/9  Decreased Interest 2 2 0 0 0  Down, Depressed, Hopeless 0 1 0 0 0  PHQ - 2 Score 2 3 0 0 0  Altered sleeping 1 2 0 0 1  Tired, decreased energy 2 1 0 0 0  Change in appetite 2 3 0 0 1  Feeling bad or failure about yourself  0 0 0 0 0  Trouble concentrating 0 0 0 0 0  Moving slowly or fidgety/restless 1 0 0 0 0  Suicidal thoughts 0 0 0 0 0  PHQ-9 Score 8 9 0 0 2        09/28/2020    9:59 AM 10/08/2017   10:47 AM  GAD 7 : Generalized Anxiety Score  Nervous, Anxious, on Edge 0 0  Control/stop worrying 1 1  Worry too much - different things 2  1  Trouble relaxing 2 0  Restless 0 0  Easily annoyed or irritable 2 1  Afraid - awful might happen 0 0  Total GAD 7 Score 7 3     Review of Systems:   Pertinent items are noted in HPI Denies any headaches, blurred vision, fatigue, shortness of breath, chest pain, abdominal pain, abnormal vaginal discharge/itching/odor/irritation, problems with periods, bowel movements, urination, or intercourse unless otherwise stated above. Pertinent History Reviewed:  Reviewed past medical,surgical, social and family history.  Reviewed problem list, medications and allergies. Physical Assessment:   Vitals:   03/18/22 1108 03/18/22 1113  BP: (!) 123/98 (!) 136/97  Pulse: 98 70  Weight: 194 lb 1.6 oz (88 kg)   Body mass index is 38.55 kg/m.        Physical Examination:   General appearance - well appearing, and in no distress  Mental status - alert, oriented to person, place, and time  Psych:  She has a normal mood and affect  Skin - warm and dry, normal color, no suspicious lesions noted  Chest - effort normal, all lung fields clear to auscultation bilaterally  Heart - normal rate and regular rhythm  Abdomen -  soft, nontender, nondistended, no masses or organomegaly  Pelvic -  VULVA: normal appearing vulva with no masses, tenderness or lesions   VAGINA: normal appearing vagina with normal color and discharge, no lesions   CERVIX: normal appearing cervix without discharge or lesions, no CMT  Thin prep pap is done with HR HPV cotesting  Extremities:  No swelling or varicosities noted  Chaperone present for exam  No results found for this or any previous visit (from the past 24 hour(s)).   NEXPLANON REMOVAL The risks (including infection, bleeding, pain, and uterine perforation) and benefits of the procedure were explained to the patient and Written informed consent was obtained.   Device was palpated in left upper arm. After time out, the skin was cleaned with alcohol and infiltrated  with 3cc of 1% lidocaine with epinephrine was used to infiltrate the skin and subcutaneous tissue deep to the device. The area was cleaned with betadine x3.  Using an 11 blade, the skin was incised and the implant was removed intact.  The implant was shown to the patient.  The skin was cleaned, incision covered with Steri-Strips, and an adhesive bandage.  Arm was wrapped and post procedure instructions provided to the patient.  Condoms chosen as new contraceptive method.    Assessment & Plan:   1. Well woman exam with routine gynecological exam Routine screening completed  2. Routine screening for STI (sexually transmitted infection) - RPR+HBsAg+HCVAb+... - Cervicovaginal ancillary only  3. Screening for cervical cancer Routine pap collected - Cytology - PAP  4. Encounter for Nexplanon removal Now s/p uncomplicated nexplanon removal. Opts for condom/pull out in the interim and may decide to resume contraception at another time. Reviewed bleeding should return to baseline within the next few weeks and immediate return of fertility.   5. Rectal pain Discussed the importance of soft formed stools, and the importance of constipation for relief of rectal pain.  Sent prescription to different pharmacy.  Given patient information for GI consult referral.  Encouraged use of dietary or over-the-counter fiber as well. - hydrocortisone (ANUSOL-HC) 25 MG suppository; Place 1 suppository (25 mg total) rectally 2 (two) times daily.  Dispense: 12 suppository; Refill: 0  Mammogram: @ 30yo, or sooner if problems Colonoscopy: @ 30yo, or sooner if problems  No orders of the defined types were placed in this encounter.   Meds: No orders of the defined types were placed in this encounter.   Follow-up: No follow-ups on file.  Darliss Cheney, MD 03/18/2022 11:22 AM

## 2022-03-19 LAB — CERVICOVAGINAL ANCILLARY ONLY
Chlamydia: NEGATIVE
Comment: NEGATIVE
Comment: NEGATIVE
Comment: NORMAL
Neisseria Gonorrhea: NEGATIVE
Trichomonas: NEGATIVE

## 2022-03-19 LAB — RPR+HBSAG+HCVAB+...
HIV Screen 4th Generation wRfx: NONREACTIVE
Hep C Virus Ab: NONREACTIVE
Hepatitis B Surface Ag: NEGATIVE
RPR Ser Ql: NONREACTIVE

## 2022-03-24 ENCOUNTER — Encounter: Payer: Self-pay | Admitting: Physician Assistant

## 2022-03-24 LAB — CYTOLOGY - PAP
Chlamydia: NEGATIVE
Comment: NEGATIVE
Comment: NEGATIVE
Comment: NORMAL
Diagnosis: UNDETERMINED — AB
High risk HPV: NEGATIVE
Neisseria Gonorrhea: NEGATIVE

## 2022-04-24 ENCOUNTER — Encounter: Payer: Self-pay | Admitting: Physician Assistant

## 2022-04-24 ENCOUNTER — Ambulatory Visit: Payer: Medicaid Other | Admitting: Physician Assistant

## 2022-04-24 VITALS — BP 104/60 | HR 64 | Ht 59.75 in | Wt 193.2 lb

## 2022-04-24 DIAGNOSIS — K625 Hemorrhage of anus and rectum: Secondary | ICD-10-CM

## 2022-04-24 DIAGNOSIS — K602 Anal fissure, unspecified: Secondary | ICD-10-CM | POA: Diagnosis not present

## 2022-04-24 DIAGNOSIS — K6289 Other specified diseases of anus and rectum: Secondary | ICD-10-CM | POA: Diagnosis not present

## 2022-04-24 MED ORDER — AMBULATORY NON FORMULARY MEDICATION: 1 refills | Status: DC

## 2022-04-24 NOTE — Patient Instructions (Addendum)
_______________________________________________________  If your blood pressure at your visit was 140/90 or greater, please contact your primary care physician to follow up on this.  _______________________________________________________  If you are age 30 or older, your body mass index should be between 23-30. Your Body mass index is 38.06 kg/m. If this is out of the aforementioned range listed, please consider follow up with your Primary Care Provider.  If you are age 46 or younger, your body mass index should be between 19-25. Your Body mass index is 38.06 kg/m. If this is out of the aformentioned range listed, please consider follow up with your Primary Care Provider.   ________________________________________________________  The [facility] providers would like to encourage you to use [facility] to communicate with providers for non-urgent requests or questions.  Due to long hold times on the telephone, sending your provider a message by [facility] may be a faster and more efficient way to get a response.  Please allow 48 business hours for a response.  Please remember that this is for non-urgent requests.  _______________________________________________________  [facility]'s information is below: Address: [address] [place], [place] [address]  [phone]  *Please DO NOT go directly from our office to pick up this medication! Give the pharmacy 1 day to process the prescription as this is compounded and takes time to make.  Continue sitz bath and using lidocaine as needed.   Please purchase the following medications over the counter and take as directed: Stool softeners  You have been scheduled for an appointment with Kristina Newer PA-C on 06-24-2022 at 10 am . Please arrive 10 minutes early for your appointment.  It was a pleasure to see you today!  Thank you for trusting me with your gastrointestinal care!

## 2022-04-24 NOTE — Progress Notes (Signed)
Chief Complaint: Rectal Bleeding and pain  HPI:    Kristina Alvarado is a 32 year old African-American female with past medical history as listed below, who was referred to me by Girtha Rm, NP-C for a complaint of rectal bleeding and pain.      03/18/2022 saw OB/GYN and continued with some rectal pain.  Apparently had not seen improvement with Lidocaine and the pharmacy had run out of Anusol.  That time discussed the importance of soft formed stools and given Anusol suppositories.  Also referred to [place].    Today, patient explains that over the past year she has had trouble passing stools, telling me that when she does it will cause some sharp pain and she will see a little bright red blood on the toilet paper and then will throb afterwards, so much so that she cannot sit down for a little while.  She has been prescribed suppositories by other physicians but never use them.  The only thing she has tried is Lidocaine which has helped a little bit with the discomfort and sits baths.  Tells me that she has noticed that eating dairy tends to make her stool harder which makes this pain worse.  She has been trying to avoid that.  No other GI issues.    Denies fever, chills, weight loss, nausea, vomiting, heartburn or reflux.  Past Medical History:  Diagnosis Date   Anemia    History of ectopic pregnancy 2019   Vitamin D deficiency 08/01/2021    Past Surgical History:  Procedure Laterality Date   CESAREAN SECTION N/A 02/17/2019   Procedure: CESAREAN SECTION;  Surgeon: Mora Bellman, MD;  Location: [facility];  Service: Obstetrics;  Laterality: N/A;   DIAGNOSTIC LAPAROSCOPY WITH REMOVAL OF ECTOPIC PREGNANCY N/A 09/10/2017   Procedure: DIAGNOSTIC LAPAROSCOPY WITH REMOVAL OF ECTOPIC PREGNANCY RIGHT SALPENGECTOMY RIGHT;  Surgeon: Osborne Oman, MD;  Location: [facility];  Service: Gynecology;  Laterality: N/A;    Current Outpatient Medications  Medication Sig Dispense Refill   hydrocortisone  (ANUSOL-HC) 25 MG suppository Place 1 suppository (25 mg total) rectally 2 (two) times daily. 12 suppository 0   lidocaine (XYLOCAINE) 5 % ointment Apply 1 Application topically as needed. (Patient not taking: Reported on 03/18/2022) 35.44 g 0   meloxicam (MOBIC) 15 MG tablet TAKE 1 TABLET(15 MG) BY MOUTH DAILY (Patient not taking: Reported on 03/18/2022) 30 tablet 0   methocarbamol (ROBAXIN) 500 MG tablet Take 1 tablet (500 mg total) by mouth every 8 (eight) hours as needed for muscle spasms. (Patient not taking: Reported on 03/18/2022) 30 tablet 0   Vitamin D, Ergocalciferol, (DRISDOL) 1.25 MG (50000 UNIT) CAPS capsule Take 1 capsule (50,000 Units total) by mouth every 7 (seven) days. (Patient not taking: Reported on 03/18/2022) 8 capsule 0   No current facility-administered medications for this visit.    Allergies as of 04/24/2022   (No Known Allergies)    Family History  Problem Relation Age of Onset   Asthma Mother    Hypertension Mother    Diabetes Mother        takes insulin   Asthma Father    Heart disease Maternal Aunt    Heart disease Maternal Grandmother     Social History   Socioeconomic History   Marital status: Single    Spouse name: Not on file   Number of children: Not on file   Years of education: Not on file   Highest education level: Not on file  Occupational  History    Comment: [employer]  Tobacco Use   Smoking status: Every Day    Types: Cigars   Smokeless tobacco: Never   Tobacco comments:    Recently restarted due to stress 09/28/20  Vaping Use   Vaping Use: Never used  Substance and Sexual Activity   Alcohol use: Not Currently    Comment: not since pregnancy   Drug use: Yes    Types: Marijuana   Sexual activity: Yes    Partners: Male    Birth control/protection: Implant  Other Topics Concern   Not on file  Social History Narrative   Not on file   Social Determinants of Health   Financial Resource Strain: Not on file  Food Insecurity: No Food  Insecurity (09/28/2020)   Hunger Vital Sign    Worried About Running Out of Food in the Last Year: Never true    Ran Out of Food in the Last Year: Never true  Transportation Needs: No Transportation Needs (09/28/2020)   PRAPARE - Hydrologist (Medical): No    Lack of Transportation (Non-Medical): No  Physical Activity: Not on file  Stress: Stress Concern Present (09/28/2020)   [place]    Feeling of Stress : Rather much  Social Connections: Not on file  Intimate Partner Violence: Not At Risk (07/21/2018)   Humiliation, Afraid, Rape, and Kick questionnaire    Fear of Current or Ex-Partner: No    Emotionally Abused: No    Physically Abused: No    Sexually Abused: No    Review of Systems:    Constitutional: No weight loss, fever or chills Skin: No rash Cardiovascular: No chest pain Respiratory: No SOB  Gastrointestinal: See HPI and otherwise negative Genitourinary: No dysuria Neurological: No headache, dizziness or syncope Musculoskeletal: No new muscle or joint pain Hematologic: No bleeding Psychiatric: No history of depression or anxiety   Physical Exam:  Vital signs: BP 104/60 (BP Location: Left Arm, Patient Position: Sitting, Cuff Size: Large)   Pulse 64   Ht 4' 11.75" (1.518 m) Comment: height measured without shoes  Wt 193 lb 4 oz (87.7 kg)   LMP 04/23/2022   BMI 38.06 kg/m    Constitutional:   Pleasant AA female appears to be in NAD, Well developed, Well nourished, alert and cooperative Head:  Normocephalic and atraumatic. Eyes:   PEERL, EOMI. No icterus. Conjunctiva pink. Ears:  Normal auditory acuity. Neck:  Supple Throat: Oral cavity and pharynx without inflammation, swelling or lesion.  Respiratory: Respirations even and unlabored. Lungs clear to auscultation bilaterally.   No wheezes, crackles, or rhonchi.  Cardiovascular: Normal S1, S2. No MRG. Regular rate and  rhythm. No peripheral edema, cyanosis or pallor.  Gastrointestinal:  Soft, nondistended, nontender. No rebound or guarding. Normal bowel sounds. No appreciable masses or hepatomegaly. Rectal: External: Tender posterior fissure, 2 hemorrhoid tags; internal: not completed due to pain Msk:  Symmetrical without gross deformities. Without edema, no deformity or joint abnormality.  Neurologic:  Alert and  oriented x4;  grossly normal neurologically.  Skin:   Dry and intact without significant lesions or rashes. Psychiatric:  Demonstrates good judgement and reason without abnormal affect or behaviors.  RELEVANT LABS AND IMAGING: CBC    Component Value Date/Time   WBC 7.7 12/26/2021 1122   RBC 4.36 12/26/2021 1122   HGB 12.8 12/26/2021 1122   HGB 9.9 (L) 12/24/2018 0927   HCT 38.1 12/26/2021 1122   HCT  29.0 (L) 12/24/2018 0927   PLT 216.0 12/26/2021 1122   PLT 218 12/24/2018 0927   MCV 87.4 12/26/2021 1122   MCV 87 12/24/2018 0927   MCH 29.7 02/18/2019 0339   MCHC 33.6 12/26/2021 1122   RDW 14.2 12/26/2021 1122   RDW 13.0 12/24/2018 0927   LYMPHSABS 2.5 12/26/2021 1122   LYMPHSABS 1.9 08/16/2018 1123   MONOABS 0.7 12/26/2021 1122   EOSABS 0.3 12/26/2021 1122   EOSABS 0.1 08/16/2018 1123   BASOSABS 0.1 12/26/2021 1122   BASOSABS 0.0 08/16/2018 1123    CMP     Component Value Date/Time   NA 140 08/01/2021 1001   NA 135 08/16/2018 1124   K 4.5 08/01/2021 1001   CL 107 08/01/2021 1001   CO2 28 08/01/2021 1001   GLUCOSE 72 08/01/2021 1001   BUN 13 08/01/2021 1001   BUN 12 08/16/2018 1124   CREATININE 0.92 08/01/2021 1001   CALCIUM 9.7 08/01/2021 1001   PROT 7.3 08/01/2021 1001   PROT 6.4 08/16/2018 1124   ALBUMIN 4.2 08/01/2021 1001   ALBUMIN 3.9 08/16/2018 1124   AST 22 08/01/2021 1001   ALT 24 08/01/2021 1001   ALKPHOS 52 08/01/2021 1001   BILITOT 0.3 08/01/2021 1001   BILITOT <0.2 08/16/2018 1124   GFRNONAA >60 02/18/2019 0339   GFRAA >60 02/18/2019 0339     Assessment: 1.  Rectal pain and bleeding: Anal fissure on exam today, tender to palpation, most likely source 2.  Anal fissure  Plan: 1.  Recommend the patient start a daily stool softener such as Dulcolax or Colace. 2.  Prescribed Diltiazem 2% applied 3 times daily for the next 6 to 8 weeks to future.  Discussed this in detail. 3.  Continue sitz baths for 15 to 20 minutes 2-3 times a day 4.  Continue Lidocaine as needed for pain 5.  Patient to follow in clinic with me in 2 months.  She was assigned to Dr. Hilarie Fredrickson this morning.  Ellouise Newer, PA-C [facility] 04/24/2022, 10:39 AM  Cc: Henson, Vickie L, NP-C

## 2022-05-10 NOTE — Progress Notes (Signed)
Addendum: Reviewed and agree with assessment and management plan. Radames Mejorado M, MD  

## 2022-06-24 ENCOUNTER — Ambulatory Visit: Payer: Medicaid Other | Admitting: Physician Assistant

## 2022-10-01 ENCOUNTER — Ambulatory Visit (INDEPENDENT_AMBULATORY_CARE_PROVIDER_SITE_OTHER): Payer: Medicaid Other | Admitting: Emergency Medicine

## 2022-10-01 VITALS — BP 119/83 | HR 70 | Wt 198.4 lb

## 2022-10-01 DIAGNOSIS — N912 Amenorrhea, unspecified: Secondary | ICD-10-CM

## 2022-10-01 DIAGNOSIS — Z3201 Encounter for pregnancy test, result positive: Secondary | ICD-10-CM

## 2022-10-01 DIAGNOSIS — Z3200 Encounter for pregnancy test, result unknown: Secondary | ICD-10-CM

## 2022-10-01 LAB — POCT URINE PREGNANCY: Preg Test, Ur: POSITIVE — AB

## 2022-10-01 NOTE — Progress Notes (Signed)
Kristina Alvarado presents today for UPT. She has no unusual complaints and nausea, vomiting. LMP: 08/13/22    OBJECTIVE: Appears well, in no apparent distress.  OB History     Gravida  3   Para  1   Term  1   Preterm      AB  1   Living  1      SAB      IAB      Ectopic  1   Multiple  0   Live Births  1          Home UPT Result: Positive In-Office UPT result: Positive I have reviewed the patient's medical, obstetrical, social, and family histories, and medications.   ASSESSMENT: Positive pregnancy test  PLAN Prenatal care to be completed at: [facility]

## 2022-10-06 ENCOUNTER — Other Ambulatory Visit: Payer: Self-pay

## 2022-10-06 ENCOUNTER — Inpatient Hospital Stay (HOSPITAL_COMMUNITY)
Admission: AD | Admit: 2022-10-06 | Discharge: 2022-10-06 | Disposition: A | Discharge status: Home / Self Care | Payer: Medicaid Other | Attending: Obstetrics & Gynecology | Admitting: Obstetrics & Gynecology

## 2022-10-06 ENCOUNTER — Encounter (HOSPITAL_COMMUNITY): Payer: Self-pay | Admitting: Obstetrics & Gynecology

## 2022-10-06 ENCOUNTER — Inpatient Hospital Stay (HOSPITAL_COMMUNITY): Payer: Medicaid Other

## 2022-10-06 DIAGNOSIS — O3481 Maternal care for other abnormalities of pelvic organs, first trimester: Secondary | ICD-10-CM | POA: Diagnosis not present

## 2022-10-06 DIAGNOSIS — O26891 Other specified pregnancy related conditions, first trimester: Secondary | ICD-10-CM | POA: Diagnosis not present

## 2022-10-06 DIAGNOSIS — N939 Abnormal uterine and vaginal bleeding, unspecified: Secondary | ICD-10-CM | POA: Diagnosis not present

## 2022-10-06 DIAGNOSIS — O209 Hemorrhage in early pregnancy, unspecified: Secondary | ICD-10-CM | POA: Insufficient documentation

## 2022-10-06 DIAGNOSIS — N83291 Other ovarian cyst, right side: Secondary | ICD-10-CM | POA: Diagnosis not present

## 2022-10-06 DIAGNOSIS — Z3A01 Less than 8 weeks gestation of pregnancy: Secondary | ICD-10-CM | POA: Insufficient documentation

## 2022-10-06 LAB — URINALYSIS, ROUTINE W REFLEX MICROSCOPIC
Bilirubin Urine: NEGATIVE
Glucose, UA: NEGATIVE mg/dL
Ketones, ur: 5 mg/dL — AB
Leukocytes,Ua: NEGATIVE
Nitrite: NEGATIVE
Protein, ur: NEGATIVE mg/dL
Specific Gravity, Urine: 1.024 (ref 1.005–1.030)
pH: 7 (ref 5.0–8.0)

## 2022-10-06 LAB — ABO/RH: ABO/RH(D): O POS

## 2022-10-06 LAB — CBC
HCT: 34.7 % — ABNORMAL LOW (ref 36.0–46.0)
Hemoglobin: 11.6 g/dL — ABNORMAL LOW (ref 12.0–15.0)
MCH: 29.3 pg (ref 26.0–34.0)
MCHC: 33.4 g/dL (ref 30.0–36.0)
MCV: 87.6 fL (ref 80.0–100.0)
Platelets: 221 10*3/uL (ref 150–400)
RBC: 3.96 MIL/uL (ref 3.87–5.11)
RDW: 13.6 % (ref 11.5–15.5)
WBC: 7.2 10*3/uL (ref 4.0–10.5)
nRBC: 0 % (ref 0.0–0.2)

## 2022-10-06 LAB — WET PREP, GENITAL
Clue Cells Wet Prep HPF POC: NONE SEEN
Sperm: NONE SEEN
Trich, Wet Prep: NONE SEEN
WBC, Wet Prep HPF POC: <10 (ref <10)
Yeast Wet Prep HPF POC: NONE SEEN

## 2022-10-06 LAB — HCG, QUANTITATIVE, PREGNANCY: hCG, Beta Chain, Quant, S: 63164 m[IU]/mL — ABNORMAL HIGH (ref <5)

## 2022-10-06 MED ORDER — PREPLUS 27-1 MG PO TABS: 1.0000 | ORAL_TABLET | Freq: Every day | ORAL | 13 refills | Status: DC

## 2022-10-06 NOTE — MAU Note (Signed)
Kristina Alvarado is a 32 y.o. at [redacted]w[redacted]d here in MAU reporting: she's spotting with wiping, reports began this morning.  Denies abdominal cramping, states has back pain that's was prior to the pregnancy.   LMP: 08/13/2022 Onset of complaint: today Pain score: 4 Vitals:   10/06/22 1134  BP: 116/67  Pulse: (!) 56  Resp: 18  Temp: 98.5 F (36.9 C)  SpO2: 100%     FHT:NA Lab orders placed from triage:   UA

## 2022-10-06 NOTE — MAU Provider Note (Signed)
Vaginal Bleeding     S Kristina Alvarado is a 32 y.o. G80P1011 pregnant female at [redacted]w[redacted]d who presents to MAU today with complaint of vaginal bleeding.  She reports spotting while wiping that began the morning of 8/26.  Denies CTX. Endorses back pain that is unchanged prior to pregnancy. HDS, SORA. LMP 08/13/22.   Has not established care yet this pregnancy.   Pertinent items noted in HPI and remainder of comprehensive ROS otherwise negative.   O BP 116/67 (BP Location: Right Arm)   Pulse (!) 56   Temp 98.5 F (36.9 C)   Resp 18   Ht 4' 11.5" (1.511 m)   Wt 90.3 kg   LMP 08/13/2022   SpO2 100%   BMI 39.52 kg/m  Physical Exam Constitutional:      General: She is not in acute distress.    Appearance: Normal appearance. She is not ill-appearing.  HENT:     Head: Normocephalic and atraumatic.     Nose: Nose normal.  Eyes:     General:        Right eye: No discharge.        Left eye: No discharge.     Extraocular Movements: Extraocular movements intact.     Conjunctiva/sclera: Conjunctivae normal.  Cardiovascular:     Rate and Rhythm: Regular rhythm. Bradycardia present.  Pulmonary:     Effort: Pulmonary effort is normal. No respiratory distress.  Abdominal:     General: Abdomen is flat.  Musculoskeletal:     Cervical back: Normal range of motion.  Skin:    General: Skin is warm and dry.  Neurological:     General: No focal deficit present.     Mental Status: She is alert and oriented to person, place, and time. Mental status is at baseline.  Psychiatric:        Mood and Affect: Mood normal.        Behavior: Behavior normal.        Thought Content: Thought content normal.        Judgment: Judgment normal.      MDM: Moderate MAU Course:  Labs: Positive Upreg 8/21 CBC w/o leukocytosis, Hgb 11.6, plts 221 ABO/Rh O pos, no rhogam needed  hCG pending Genprobe collected, pending.  Wet Prep negative UA with ketones, noninfectious   Imaging: US OB <14weeks =  single intrauterine gestation at [redacted]w[redacted]d, FHR 113, no subchorionic hemorrhage   Interventions: No infection or cause to vaginal bleeding on labs / imaging.  Pt remained in stable condition throughout admission.  Given usual / strict return precautions.  Plan to f/u Genprobe.  Given list of providers to establish for ongoing OB care.    A&P: 1. [redacted] weeks gestation of pregnancy  2. Vaginal spotting  Written for prenatal vitamins.   Discharge from MAU in stable condition with usual / strict precautions Follow up at one of the providers listed in discharge instructions as scheduled for ongoing prenatal care  Allergies as of 10/06/2022   No Known Allergies      Medication List     STOP taking these medications    AMBULATORY NON FORMULARY MEDICATION   meloxicam 15 MG tablet Commonly known as: MOBIC   methocarbamol 500 MG tablet Commonly known as: ROBAXIN       TAKE these medications    lidocaine 5 % ointment Commonly known as: XYLOCAINE Apply 1 Application topically as needed.   PrePLUS 27-1 MG Tabs Take 1 tablet by mouth daily.  Hessie Dibble, MD 10/06/2022 1:26 PM

## 2022-10-06 NOTE — Discharge Instructions (Signed)

## 2022-10-08 LAB — GC/CHLAMYDIA PROBE AMP (~~LOC~~) NOT AT ARMC
Chlamydia: NEGATIVE
Comment: NEGATIVE
Comment: NORMAL
Neisseria Gonorrhea: NEGATIVE

## 2022-10-16 ENCOUNTER — Ambulatory Visit (INDEPENDENT_AMBULATORY_CARE_PROVIDER_SITE_OTHER): Payer: Medicaid Other | Admitting: *Deleted

## 2022-10-16 DIAGNOSIS — O219 Vomiting of pregnancy, unspecified: Secondary | ICD-10-CM

## 2022-10-16 DIAGNOSIS — Z3480 Encounter for supervision of other normal pregnancy, unspecified trimester: Secondary | ICD-10-CM | POA: Insufficient documentation

## 2022-10-16 DIAGNOSIS — Z3481 Encounter for supervision of other normal pregnancy, first trimester: Secondary | ICD-10-CM

## 2022-10-16 DIAGNOSIS — Z3A08 8 weeks gestation of pregnancy: Secondary | ICD-10-CM

## 2022-10-16 MED ORDER — PROMETHAZINE HCL 25 MG PO TABS
25.0000 mg | ORAL_TABLET | Freq: Four times a day (QID) | ORAL | 1 refills | Status: DC | PRN
Start: 2022-10-16 — End: 2023-05-24

## 2022-10-16 MED ORDER — DOXYLAMINE-PYRIDOXINE 10-10 MG PO TBEC
2.0000 | DELAYED_RELEASE_TABLET | Freq: Every day | ORAL | 5 refills | Status: DC
Start: 2022-10-16 — End: 2023-05-24

## 2022-10-16 NOTE — Progress Notes (Signed)
New OB Intake  I connected with Kristina Alvarado  on 10/16/22 at  1:10 PM EDT by Telephone Visit and verified that I am speaking with the correct person using two identifiers. Nurse is located at [facility] and pt is located at [place].  I discussed the limitations, risks, security and privacy concerns of performing an evaluation and management service by telephone and the availability of in person appointments. I also discussed with the patient that there may be a patient responsible charge related to this service. The patient expressed understanding and agreed to proceed.  I explained I am completing New OB Intake today. We discussed EDD of 05/28/2023, by Ultrasound. Pt is G3P1011. I reviewed her allergies, medications and Medical/Surgical/OB history.    Patient Active Problem List   Diagnosis Date Noted   Supervision of other normal pregnancy, antepartum 10/16/2022   Acute left-sided low back pain without sciatica 08/01/2021   Fatigue 08/01/2021   Dry skin 08/01/2021   Vitamin D deficiency 08/01/2021   Anemia 01/27/2019   Abnormal TSH 08/18/2018    Concerns addressed today  Delivery Plans Plans to deliver at [facility]. Discussed the nature of our practice with multiple providers including residents and students. Due to the size of the practice, the delivering provider may not be the same as those providing prenatal care.   Patient not a candidate for water birth. Offered upcoming OB visit with CNM to discuss further.  MyChart/Babyscripts MyChart access verified. I explained pt will have some visits in office and some virtually. Babyscripts instructions given and order placed. Patient verifies receipt of registration text/e-mail. Account successfully created and app downloaded.  Blood Pressure Cuff/Weight Scale Pt has BP cuff. Explained after first prenatal appt pt will check weekly and document in Babyscripts. Patient does not have weight scale; patient may purchase if they desire to  track weight weekly in Babyscripts.  Anatomy US Explained first scheduled [place] will be around 19 weeks. Anatomy US scheduled for TBD at TBD.  Interested in [facility]? If yes, send referral and doula dot phrase.   Is patient a candidate for Babyscripts Optimization? No - High Risk  First visit review I reviewed new OB appt with patient. Explained pt will be seen by Dr. Para March at first visit. Discussed Avelina Laine genetic screening with patient. Requests Panorama and Horizon.. Routine prenatal labs  not collected/ telephone visit    Last Pap Diagnosis  Date Value Ref Range Status  03/18/2022 (A)  Final   - Atypical squamous cells of undetermined significance (ASC-US)    Harrel Lemon, RN 10/16/2022  1:48 PM

## 2022-10-16 NOTE — Patient Instructions (Signed)

## 2022-10-22 ENCOUNTER — Ambulatory Visit: Payer: Medicaid Other | Admitting: Obstetrics and Gynecology

## 2022-10-29 ENCOUNTER — Ambulatory Visit: Payer: Medicaid Other | Admitting: Obstetrics and Gynecology

## 2022-11-13 ENCOUNTER — Ambulatory Visit (INDEPENDENT_AMBULATORY_CARE_PROVIDER_SITE_OTHER): Payer: Medicaid Other | Admitting: Obstetrics and Gynecology

## 2022-11-13 ENCOUNTER — Encounter: Payer: Self-pay | Admitting: Obstetrics and Gynecology

## 2022-11-13 VITALS — BP 111/72 | HR 85 | Wt 196.0 lb

## 2022-11-13 DIAGNOSIS — O99011 Anemia complicating pregnancy, first trimester: Secondary | ICD-10-CM

## 2022-11-13 DIAGNOSIS — E559 Vitamin D deficiency, unspecified: Secondary | ICD-10-CM | POA: Diagnosis not present

## 2022-11-13 DIAGNOSIS — Z3A12 12 weeks gestation of pregnancy: Secondary | ICD-10-CM | POA: Diagnosis not present

## 2022-11-13 DIAGNOSIS — R7989 Other specified abnormal findings of blood chemistry: Secondary | ICD-10-CM

## 2022-11-13 DIAGNOSIS — Z3481 Encounter for supervision of other normal pregnancy, first trimester: Secondary | ICD-10-CM | POA: Diagnosis not present

## 2022-11-13 DIAGNOSIS — Z98891 History of uterine scar from previous surgery: Secondary | ICD-10-CM | POA: Diagnosis not present

## 2022-11-13 DIAGNOSIS — Z3480 Encounter for supervision of other normal pregnancy, unspecified trimester: Secondary | ICD-10-CM | POA: Diagnosis not present

## 2022-11-13 DIAGNOSIS — Z1339 Encounter for screening examination for other mental health and behavioral disorders: Secondary | ICD-10-CM | POA: Diagnosis not present

## 2022-11-13 MED ORDER — ONDANSETRON 4 MG PO TBDP
4.0000 mg | ORAL_TABLET | Freq: Four times a day (QID) | ORAL | 1 refills | Status: DC | PRN
Start: 2022-11-13 — End: 2023-05-24

## 2022-11-13 NOTE — Progress Notes (Signed)
History:   Kristina Alvarado is a 32 y.o. G3P1011 at [redacted]w[redacted]d by LMP being seen today for her first obstetrical visit.   Patient does intend to breast feed.   Pregnancy history fully reviewed. Obstetrical history is significant for 1LTCS d/t NRFHT at 5 cm.   Patient reports nausea.      HISTORY: OB History  Gravida Para Term Preterm AB Living  3 1 1  0 1 1  SAB IAB Ectopic Multiple Live Births  0 0 1 0 1    # Outcome Date GA Lbr Len/2nd Weight Sex Type Anes PTL Lv  3 Current           2 Term 02/17/19 [redacted]w[redacted]d  5 lb 2.7 oz (2.345 kg) F CS-LTranv EPI  LIV     Name: ISADORA, DELOREY     Apgar1: 8  Apgar5: 9  1 Ectopic 09/10/17             Last pap smear was done 03/2022 and was ASCUS/HPV neg.  Lab Results  Component Value Date   DIAGPAP (A) 03/18/2022    - Atypical squamous cells of undetermined significance (ASC-US)   DIAGPAP  12/17/2017    NEGATIVE FOR INTRAEPITHELIAL LESIONS OR MALIGNANCY.   DIAGPAP  12/17/2017    FUNGAL ORGANISMS PRESENT CONSISTENT WITH CANDIDA SPP.   HPVHIGH Negative 03/18/2022     Past Medical History:  Diagnosis Date   Anemia    History of ectopic pregnancy 2019   Vitamin D deficiency 08/01/2021   Past Surgical History:  Procedure Laterality Date   CESAREAN SECTION N/A 02/17/2019   Procedure: CESAREAN SECTION;  Surgeon: Catalina Antigua, MD;  Location: [facility] ORS;  Service: Obstetrics;  Laterality: N/A;   DIAGNOSTIC LAPAROSCOPY WITH REMOVAL OF ECTOPIC PREGNANCY N/A 09/10/2017   Procedure: DIAGNOSTIC LAPAROSCOPY WITH REMOVAL OF ECTOPIC PREGNANCY RIGHT SALPENGECTOMY RIGHT;  Surgeon: Tereso Newcomer, MD;  Location: [facility] ORS;  Service: Gynecology;  Laterality: N/A;   Family History  Problem Relation Age of Onset   Kidney disease Mother    Heart disease Mother    Asthma Mother    Hypertension Mother    Diabetes Mother        takes insulin   Asthma Father    Asthma Sister    Heart disease Maternal Aunt    Heart disease Maternal Grandmother     Social History   Tobacco Use   Smoking status: Every Day    Types: Cigars   Smokeless tobacco: Never   Tobacco comments:    Recently restarted due to stress 09/28/20  Vaping Use   Vaping status: Never Used  Substance Use Topics   Alcohol use: Yes    Comment: a bottle of wine a day   Drug use: Not Currently    Types: Marijuana   No Known Allergies Current Outpatient Medications on File Prior to Visit  Medication Sig Dispense Refill   Doxylamine-Pyridoxine (DICLEGIS) 10-10 MG TBEC Take 2 tablets by mouth at bedtime. If symptoms persist, add one tablet in the morning and one in the afternoon 100 tablet 5   Prenatal Vit-Fe Fumarate-FA (PREPLUS) 27-1 MG TABS Take 1 tablet by mouth daily. 30 tablet 13   promethazine (PHENERGAN) 25 MG tablet Take 1 tablet (25 mg total) by mouth every 6 (six) hours as needed for nausea or vomiting. 30 tablet 1   No current facility-administered medications on file prior to visit.    Review of Systems Pertinent items noted in HPI and remainder of  comprehensive ROS otherwise negative.  Physical Exam:   Vitals:   11/13/22 1431  BP: 111/72  Pulse: 85  Weight: 196 lb (88.9 kg)   Fetal Heart Rate (bpm): 150  Patient informed that the ultrasound is considered a limited obstetric ultrasound and is not intended to be a complete ultrasound exam.  Patient also informed that the ultrasound is not being completed with the intent of assessing for fetal or placental anomalies or any pelvic abnormalities.  Explained that the purpose of today's ultrasound is to assess for fetal heart rate.  Patient acknowledges the purpose of the exam and the limitations of the study.  General: well-developed, well-nourished female in no acute distress  Breasts:  normal appearance, no masses or tenderness bilaterally  Skin: normal coloration and turgor, no rashes  Neurologic: oriented, normal, negative, normal mood  Extremities: normal strength, tone, and muscle mass, ROM of  all joints is normal  HEENT PERRLA, extraocular movement intact and sclera clear, anicteric  Neck supple and no masses  Cardiovascular: regular rate and rhythm  Respiratory:  no respiratory distress, normal breath sounds  Abdomen: soft, non-tender; bowel sounds normal; no masses,  no organomegaly  Pelvic: Not examined    Assessment:    Pregnancy: [id number] Patient Active Problem List   Diagnosis Date Noted   Supervision of other normal pregnancy, antepartum 10/16/2022   Acute left-sided low back pain without sciatica 08/01/2021   Fatigue 08/01/2021   Dry skin 08/01/2021   Vitamin D deficiency 08/01/2021   Abnormal TSH 08/18/2018     Plan:    1. Supervision of other normal pregnancy, antepartum Initial labs ordered and drawn. Discussed ACURE4MOMs. Pt accepts Continue prenatal vitamins. Problem list reviewed and updated. Genetic Screening discussed, NIPS: ordered. Ultrasound discussed; fetal anatomic survey: ordered. Scheduled for 11/21.  Anticipatory guidance about prenatal visits given including labs, ultrasounds, and testing. Discussed usage of Babyscripts and virtual visits Continue diclegis. Discussed Zofran. Phenergan not helping - may stop.  - TSH Rfx on Abnormal to Free T4 - Vitamin D 1,25 dihydroxy - CBC/D/Plt+RPR+Rh+ABO+RubIgG... - HgB A1c - Culture, OB Urine - PANORAMA PRENATAL TEST  2. Abnormal TSH - TSH Rfx on Abnormal to Free T4  3. Vitamin D deficiency - Vitamin D 1,25 dihydroxy  4. Pregnancy with 12 completed weeks gestation   5. History of cesarean delivery Discussed prior delivery. She would like TOLAC.     The nature of [place] [facility] Practice with multiple MDs and Advanced Practice Providers was explained to patient; also emphasized that residents, students are part of our team. Routine obstetric precautions reviewed. Encouraged to seek out care at office or emergency room [facility] MAU preferred) for urgent and/or  emergent concerns. No follow-ups on file.    Milas Hock, MD, FACOG Obstetrician & Gynecologist, [facility] for [facility]

## 2022-11-15 LAB — CULTURE, OB URINE

## 2022-11-15 LAB — URINE CULTURE, OB REFLEX: Organism ID, Bacteria: NO GROWTH

## 2022-11-19 ENCOUNTER — Encounter: Payer: Self-pay | Admitting: Obstetrics and Gynecology

## 2022-11-19 LAB — PANORAMA PRENATAL TEST FULL PANEL:PANORAMA TEST PLUS 5 ADDITIONAL MICRODELETIONS: FETAL FRACTION: 6.2

## 2022-11-26 LAB — HCV INTERPRETATION

## 2022-11-26 LAB — CBC/D/PLT+RPR+RH+ABO+RUBIGG...
Antibody Screen: NEGATIVE
Basophils Absolute: 0 10*3/uL (ref 0.0–0.2)
Basos: 1 %
EOS (ABSOLUTE): 0.2 10*3/uL (ref 0.0–0.4)
Eos: 2 %
HCV Ab: NONREACTIVE
HIV Screen 4th Generation wRfx: NONREACTIVE
Hematocrit: 32 % — ABNORMAL LOW (ref 34.0–46.6)
Hemoglobin: 10.7 g/dL — ABNORMAL LOW (ref 11.1–15.9)
Hepatitis B Surface Ag: NEGATIVE
Immature Grans (Abs): 0 10*3/uL (ref 0.0–0.1)
Immature Granulocytes: 0 %
Lymphocytes Absolute: 1.9 10*3/uL (ref 0.7–3.1)
Lymphs: 23 %
MCH: 28.5 pg (ref 26.6–33.0)
MCHC: 33.4 g/dL (ref 31.5–35.7)
MCV: 85 fL (ref 79–97)
Monocytes Absolute: 0.8 10*3/uL (ref 0.1–0.9)
Monocytes: 10 %
Neutrophils Absolute: 5.4 10*3/uL (ref 1.4–7.0)
Neutrophils: 64 %
Platelets: 240 10*3/uL (ref 150–450)
RBC: 3.76 x10E6/uL — ABNORMAL LOW (ref 3.77–5.28)
RDW: 12.6 % (ref 11.7–15.4)
RPR Ser Ql: NONREACTIVE
Rh Factor: POSITIVE
Rubella Antibodies, IGG: 1.85 {index} (ref >0.99)
WBC: 8.3 10*3/uL (ref 3.4–10.8)

## 2022-11-26 LAB — VITAMIN D 1,25 DIHYDROXY
Vitamin D 1, 25 (OH)2 Total: 86 pg/mL — ABNORMAL HIGH
Vitamin D2 1, 25 (OH)2: <10 pg/mL
Vitamin D3 1, 25 (OH)2: 80 pg/mL

## 2022-11-26 LAB — TSH RFX ON ABNORMAL TO FREE T4: TSH: 0.931 u[IU]/mL (ref 0.450–4.500)

## 2022-11-26 LAB — HEMOGLOBIN A1C
Est. average glucose Bld gHb Est-mCnc: 108 mg/dL
Hgb A1c MFr Bld: 5.4 % (ref 4.8–5.6)

## 2022-11-26 MED ORDER — FERROUS SULFATE 325 (65 FE) MG PO TABS
325.0000 mg | ORAL_TABLET | ORAL | 1 refills | Status: DC
Start: 2022-11-26 — End: 2023-03-12

## 2022-11-26 NOTE — Addendum Note (Signed)
Addended by: Milas Hock A on: 11/26/2022 12:46 PM   Modules accepted: Orders

## 2022-12-11 ENCOUNTER — Ambulatory Visit: Payer: Medicaid Other | Admitting: Student

## 2022-12-11 VITALS — BP 100/63 | HR 75 | Wt 197.3 lb

## 2022-12-11 DIAGNOSIS — R519 Headache, unspecified: Secondary | ICD-10-CM

## 2022-12-11 DIAGNOSIS — Z3480 Encounter for supervision of other normal pregnancy, unspecified trimester: Secondary | ICD-10-CM

## 2022-12-11 DIAGNOSIS — Z98891 History of uterine scar from previous surgery: Secondary | ICD-10-CM

## 2022-12-11 DIAGNOSIS — Z3A16 16 weeks gestation of pregnancy: Secondary | ICD-10-CM

## 2022-12-11 DIAGNOSIS — O219 Vomiting of pregnancy, unspecified: Secondary | ICD-10-CM

## 2022-12-11 MED ORDER — BUTALBITAL-APAP-CAFFEINE 50-325-40 MG PO TABS: 1.0000 | ORAL_TABLET | Freq: Four times a day (QID) | ORAL | 2 refills | Status: DC | PRN

## 2022-12-11 NOTE — Progress Notes (Signed)
   PRENATAL VISIT NOTE  Subjective:  Kristina Alvarado is a 32 y.o. G3P1011 at [redacted]w[redacted]d being seen today for ongoing prenatal care.  She is currently monitored for the following issues for this low-risk pregnancy and has Abnormal TSH; Acute left-sided low back pain without sciatica; Fatigue; Dry skin; Vitamin D deficiency; Supervision of other normal pregnancy, antepartum; and History of cesarean delivery on their problem list.  Patient reports headache, nausea, and decrease in appetite . Headaches are occurring almost every other day. Patient reports photosensitivity and nausea with onset of headache. Has been taking 200-500mg  of Tylenol. Headaches are relieved by sleeping in a dark room. Has associated nausea that has been making it difficult to eat or drink water as musch as needed.   Contractions: Not present. Vag. Bleeding: None.  Movement: Present. Denies leaking of fluid.   The following portions of the patient's history were reviewed and updated as appropriate: allergies, current medications, past family history, past medical history, past social history, past surgical history and problem list.   Objective:   Vitals:   12/11/22 1143  BP: 100/63  Pulse: 75  Weight: 197 lb 4.8 oz (89.5 kg)    Fetal Status: Fetal Heart Rate (bpm): 156   Movement: Present     General:  Alert, oriented and cooperative. Patient is in no acute distress.  Skin: Skin is warm and dry. No rash noted.   Cardiovascular: Normal heart rate noted  Respiratory: Normal respiratory effort, no problems with respiration noted  Abdomen: Soft, gravid, appropriate for gestational age.  Pain/Pressure: Absent     Pelvic: Cervical exam deferred        Extremities: Normal range of motion.  Edema: None  Mental Status: Normal mood and affect. Normal behavior. Normal judgment and thought content.   Assessment and Plan:  Pregnancy: G3P1011 at [redacted]w[redacted]d 1. Supervision of other normal pregnancy, antepartum - anticipatory guidance  provided - encouraged to take iron tablet  2. Frequent headaches - Discussed importance of rest, hydration and nutrition. Encouraged to try 1g Tylenol when headaches arrive. If unrelieved by tylenol, may try Fioricet. May also try Phenergan at the onset of headaches with nausea  3. [redacted] weeks gestation of pregnancy - follow-up in 4 weeks - Anatomy scan up and coming   4. History of cesarean delivery - still desires TOLAC and BTL  5. Nausea and vomiting during pregnancy - Encouraged to utilize phenergan and zofran as previously ordered   Preterm labor symptoms and general obstetric precautions including but not limited to vaginal bleeding, contractions, leaking of fluid and fetal movement were reviewed in detail with the patient. Please refer to After Visit Summary for other counseling recommendations.   Return in about 4 weeks (around 01/08/2023) for LOB, IN-PERSON.  Future Appointments  Date Time Provider Department Center  01/01/2023 12:15 PM [facility] NURSE [facility]  01/01/2023 12:30 PM [facility]  01/12/2023 11:15 AM Sundra Aland, MD [facility] None    Corlis Hove, NP

## 2022-12-11 NOTE — Progress Notes (Signed)
Pt reports fetal movement, denies pain.

## 2022-12-22 DIAGNOSIS — O99210 Obesity complicating pregnancy, unspecified trimester: Secondary | ICD-10-CM | POA: Insufficient documentation

## 2023-01-01 ENCOUNTER — Encounter: Payer: Self-pay | Admitting: *Deleted

## 2023-01-01 ENCOUNTER — Ambulatory Visit: Payer: Medicaid Other | Attending: Obstetrics and Gynecology

## 2023-01-01 ENCOUNTER — Ambulatory Visit: Payer: Medicaid Other | Admitting: *Deleted

## 2023-01-01 ENCOUNTER — Other Ambulatory Visit: Payer: Self-pay | Admitting: *Deleted

## 2023-01-01 ENCOUNTER — Other Ambulatory Visit: Payer: Self-pay

## 2023-01-01 VITALS — BP 128/63 | HR 87

## 2023-01-01 DIAGNOSIS — O34219 Maternal care for unspecified type scar from previous cesarean delivery: Secondary | ICD-10-CM | POA: Diagnosis not present

## 2023-01-01 DIAGNOSIS — O321XX0 Maternal care for breech presentation, not applicable or unspecified: Secondary | ICD-10-CM | POA: Insufficient documentation

## 2023-01-01 DIAGNOSIS — O99210 Obesity complicating pregnancy, unspecified trimester: Secondary | ICD-10-CM | POA: Diagnosis present

## 2023-01-01 DIAGNOSIS — Z362 Encounter for other antenatal screening follow-up: Secondary | ICD-10-CM

## 2023-01-01 DIAGNOSIS — Z3A19 19 weeks gestation of pregnancy: Secondary | ICD-10-CM | POA: Diagnosis not present

## 2023-01-01 DIAGNOSIS — O99212 Obesity complicating pregnancy, second trimester: Secondary | ICD-10-CM | POA: Diagnosis not present

## 2023-01-01 DIAGNOSIS — O09292 Supervision of pregnancy with other poor reproductive or obstetric history, second trimester: Secondary | ICD-10-CM | POA: Insufficient documentation

## 2023-01-01 DIAGNOSIS — Z3480 Encounter for supervision of other normal pregnancy, unspecified trimester: Secondary | ICD-10-CM | POA: Diagnosis not present

## 2023-01-12 ENCOUNTER — Ambulatory Visit (INDEPENDENT_AMBULATORY_CARE_PROVIDER_SITE_OTHER): Payer: Medicaid Other | Admitting: Family Medicine

## 2023-01-12 VITALS — BP 110/69 | HR 88 | Wt 201.0 lb

## 2023-01-12 DIAGNOSIS — Z98891 History of uterine scar from previous surgery: Secondary | ICD-10-CM

## 2023-01-12 DIAGNOSIS — Z3A20 20 weeks gestation of pregnancy: Secondary | ICD-10-CM

## 2023-01-12 DIAGNOSIS — R7989 Other specified abnormal findings of blood chemistry: Secondary | ICD-10-CM

## 2023-01-12 DIAGNOSIS — Z3480 Encounter for supervision of other normal pregnancy, unspecified trimester: Secondary | ICD-10-CM

## 2023-01-12 DIAGNOSIS — O99210 Obesity complicating pregnancy, unspecified trimester: Secondary | ICD-10-CM

## 2023-01-12 NOTE — Progress Notes (Signed)
   PRENATAL VISIT NOTE  Subjective:  Kristina Alvarado is a 32 y.o. G3P1011 at [redacted]w[redacted]d being seen today for ongoing prenatal care.  She is currently monitored for the following issues for this low-risk pregnancy and has Abnormal TSH; Acute left-sided low back pain without sciatica; Fatigue; Vitamin D deficiency; Supervision of other normal pregnancy, antepartum; History of cesarean delivery; and Obesity affecting pregnancy, antepartum on their problem list.  Patient reports no complaints.  Contractions: Not present. Vag. Bleeding: None.  Movement: Present. Denies leaking of fluid.   The following portions of the patient's history were reviewed and updated as appropriate: allergies, current medications, past family history, past medical history, past social history, past surgical history and problem list.   Objective:   Vitals:   01/12/23 1128  BP: 110/69  Pulse: 88  Weight: 201 lb (91.2 kg)    Fetal Status: Fetal Heart Rate (bpm): 156 Fundal Height: 23 cm Movement: Present     General:  Alert, oriented and cooperative. Patient is in no acute distress.  Skin: Skin is warm and dry. No rash noted.   Cardiovascular: Normal heart rate noted  Respiratory: Normal respiratory effort, no problems with respiration noted  Abdomen: Soft, gravid, appropriate for gestational age.  Pain/Pressure: Absent     Pelvic: Cervical exam deferred        Extremities: Normal range of motion.  Edema: None  Mental Status: Normal mood and affect. Normal behavior. Normal judgment and thought content.   Assessment and Plan:  Pregnancy: G3P1011 at [redacted]w[redacted]d  Supervision of other normal pregnancy, antepartum - Plan: AFP, Serum, Open Spina Bifida Prenatal course reviewed BP, HR, FHR normal Feeling regular FM Plan for AFP today  Abnormal TSH - Plan: TSH Rfx on Abnormal to Free T4 Last TSH in Oct was wnl Repeat today  History of cesarean delivery Discussed TOLAC today, has doula and would like to proceed with  plans for TOLAC Will sign consent at next visit  Obesity affecting pregnancy, antepartum, unspecified obesity type BMI 39 pregravid Will need growth Korea at 32 weeks  Preterm labor symptoms and general obstetric precautions including but not limited to vaginal bleeding, contractions, leaking of fluid and fetal movement were reviewed in detail with the patient. Please refer to After Visit Summary for other counseling recommendations.   Return in about 4 weeks (around 02/09/2023) for Routine prenatal care.  Future Appointments  Date Time Provider Department Center  02/05/2023  9:30 AM [facility] US[facility]  02/09/2023 10:35 AM Sue Lush, FNP [facility] None    Sundra Aland, MD

## 2023-02-05 ENCOUNTER — Ambulatory Visit: Payer: Medicaid Other | Attending: Obstetrics and Gynecology

## 2023-02-05 ENCOUNTER — Other Ambulatory Visit: Payer: Self-pay | Admitting: *Deleted

## 2023-02-05 ENCOUNTER — Other Ambulatory Visit: Payer: Self-pay

## 2023-02-05 DIAGNOSIS — O99212 Obesity complicating pregnancy, second trimester: Secondary | ICD-10-CM

## 2023-02-05 DIAGNOSIS — O99213 Obesity complicating pregnancy, third trimester: Secondary | ICD-10-CM

## 2023-02-05 DIAGNOSIS — O34219 Maternal care for unspecified type scar from previous cesarean delivery: Secondary | ICD-10-CM

## 2023-02-05 DIAGNOSIS — Z3A24 24 weeks gestation of pregnancy: Secondary | ICD-10-CM | POA: Diagnosis not present

## 2023-02-05 DIAGNOSIS — O09292 Supervision of pregnancy with other poor reproductive or obstetric history, second trimester: Secondary | ICD-10-CM | POA: Diagnosis not present

## 2023-02-05 DIAGNOSIS — E669 Obesity, unspecified: Secondary | ICD-10-CM

## 2023-02-05 DIAGNOSIS — Z362 Encounter for other antenatal screening follow-up: Secondary | ICD-10-CM | POA: Diagnosis not present

## 2023-02-09 ENCOUNTER — Ambulatory Visit (INDEPENDENT_AMBULATORY_CARE_PROVIDER_SITE_OTHER): Payer: Medicaid Other | Admitting: Obstetrics and Gynecology

## 2023-02-09 VITALS — BP 137/76 | HR 102 | Wt 205.9 lb

## 2023-02-09 DIAGNOSIS — Z3482 Encounter for supervision of other normal pregnancy, second trimester: Secondary | ICD-10-CM

## 2023-02-09 DIAGNOSIS — Z98891 History of uterine scar from previous surgery: Secondary | ICD-10-CM

## 2023-02-09 DIAGNOSIS — K5900 Constipation, unspecified: Secondary | ICD-10-CM

## 2023-02-09 DIAGNOSIS — Z3A24 24 weeks gestation of pregnancy: Secondary | ICD-10-CM

## 2023-02-09 DIAGNOSIS — Z3480 Encounter for supervision of other normal pregnancy, unspecified trimester: Secondary | ICD-10-CM

## 2023-02-09 MED ORDER — DOCUSATE SODIUM 100 MG PO CAPS: 100.0000 mg | ORAL_CAPSULE | Freq: Two times a day (BID) | ORAL | 0 refills | Status: DC | PRN

## 2023-02-09 NOTE — Progress Notes (Signed)
   PRENATAL VISIT NOTE  Subjective:  Kristina Alvarado is a 32 y.o. G3P1011 at [redacted]w[redacted]d being seen today for ongoing prenatal care.  She is currently monitored for the following issues for this high-risk pregnancy and has Abnormal TSH; Acute left-sided low back pain without sciatica; Fatigue; Vitamin D deficiency; Supervision of other normal pregnancy, antepartum; History of cesarean delivery; and Obesity affecting pregnancy, antepartum on their problem list.  Patient reports  hip pain .  Contractions: Not present. Vag. Bleeding: None.  Movement: Present. Denies leaking of fluid.   The following portions of the patient's history were reviewed and updated as appropriate: allergies, current medications, past family history, past medical history, past social history, past surgical history and problem list.   Objective:   Vitals:   02/09/23 1050  BP: 137/76  Pulse: (!) 102  Weight: 205 lb 14.4 oz (93.4 kg)    Fetal Status: Fetal Heart Rate (bpm): 150 Fundal Height: 24 cm Movement: Present     General:  Alert, oriented and cooperative. Patient is in no acute distress.  Skin: Skin is warm and dry. No rash noted.   Cardiovascular: Normal heart rate noted  Respiratory: Normal respiratory effort, no problems with respiration noted  Abdomen: Soft, gravid, appropriate for gestational age.  Pain/Pressure: Present (Hip pain)     Pelvic: Cervical exam deferred        Extremities: Normal range of motion.  Edema: None  Mental Status: Normal mood and affect. Normal behavior. Normal judgment and thought content.   Assessment and Plan:  Pregnancy: G3P1011 at [redacted]w[redacted]d 1. Supervision of other normal pregnancy, antepartum (Primary) BP and FHR normal Doing well, feeling regular movement    2. History of cesarean delivery Desires TOLAC and interested in BTL  Discussed signing consent next visit   3. [redacted] weeks gestation of pregnancy Anticipatory guidance regarding GTT next visit, discussed come fasting  after midnight and expect to be here two hours   4. Constipation, unspecified constipation type Encouraged fiber, discussed prune juice and colace if needed  - docusate sodium (COLACE) 100 MG capsule; Take 1 capsule (100 mg total) by mouth 2 (two) times daily as needed for mild constipation.  Dispense: 10 capsule; Refill: 0   Preterm labor symptoms and general obstetric precautions including but not limited to vaginal bleeding, contractions, leaking of fluid and fetal movement were reviewed in detail with the patient. Please refer to After Visit Summary for other counseling recommendations.   Return in about 4 weeks (around 03/09/2023) for OB VISIT (MD or APP), 2 hr GTT.  Future Appointments  Date Time Provider Department Center  03/05/2023 11:15 AM [facility] NURSE [facility]  03/05/2023 11:30 AM [facility]3 [facility]  03/09/2023  2:50 PM Leftwich-Kirby, Wilmer Floor, CNM [facility] None  04/02/2023 11:15 AM [facility] NURSE [facility]  04/02/2023 11:30 AM [facility] US2 [facility]    Albertine Grates, FNP

## 2023-03-05 ENCOUNTER — Ambulatory Visit: Payer: Medicaid Other | Admitting: *Deleted

## 2023-03-05 ENCOUNTER — Other Ambulatory Visit: Payer: Self-pay

## 2023-03-05 ENCOUNTER — Ambulatory Visit: Payer: Medicaid Other | Attending: Maternal & Fetal Medicine

## 2023-03-05 VITALS — BP 119/66 | HR 105

## 2023-03-05 DIAGNOSIS — O99213 Obesity complicating pregnancy, third trimester: Secondary | ICD-10-CM

## 2023-03-05 DIAGNOSIS — O09293 Supervision of pregnancy with other poor reproductive or obstetric history, third trimester: Secondary | ICD-10-CM

## 2023-03-05 DIAGNOSIS — E669 Obesity, unspecified: Secondary | ICD-10-CM

## 2023-03-05 DIAGNOSIS — Z3A28 28 weeks gestation of pregnancy: Secondary | ICD-10-CM | POA: Diagnosis not present

## 2023-03-05 DIAGNOSIS — O34219 Maternal care for unspecified type scar from previous cesarean delivery: Secondary | ICD-10-CM | POA: Diagnosis not present

## 2023-03-09 ENCOUNTER — Ambulatory Visit (INDEPENDENT_AMBULATORY_CARE_PROVIDER_SITE_OTHER): Payer: Medicaid Other | Admitting: Nurse Practitioner

## 2023-03-09 VITALS — BP 111/71 | HR 84 | Wt 210.6 lb

## 2023-03-09 DIAGNOSIS — Z3A28 28 weeks gestation of pregnancy: Secondary | ICD-10-CM | POA: Diagnosis not present

## 2023-03-09 DIAGNOSIS — Z3480 Encounter for supervision of other normal pregnancy, unspecified trimester: Secondary | ICD-10-CM

## 2023-03-09 DIAGNOSIS — Z3493 Encounter for supervision of normal pregnancy, unspecified, third trimester: Secondary | ICD-10-CM | POA: Diagnosis not present

## 2023-03-09 DIAGNOSIS — Z98891 History of uterine scar from previous surgery: Secondary | ICD-10-CM

## 2023-03-09 DIAGNOSIS — O99210 Obesity complicating pregnancy, unspecified trimester: Secondary | ICD-10-CM

## 2023-03-09 MED ORDER — ASPIRIN 81 MG PO TBEC: 81.0000 mg | DELAYED_RELEASE_TABLET | Freq: Every day | ORAL | 12 refills | Status: DC

## 2023-03-09 NOTE — Progress Notes (Signed)
   PRENATAL VISIT NOTE  Subjective:  Kristina Alvarado is a 33 y.o. G3P1011 at [redacted]w[redacted]d being seen today for ongoing prenatal care.  She is currently monitored for the following issues for this low-risk pregnancy and has Abnormal TSH; Acute left-sided low back pain without sciatica; Fatigue; Vitamin D deficiency; Supervision of other normal pregnancy, antepartum; History of cesarean delivery; and Obesity affecting pregnancy, antepartum on their problem list.  Patient reports no complaints.  Contractions: Not present. Vag. Bleeding: None.  Movement: Present. Denies leaking of fluid.   The following portions of the patient's history were reviewed and updated as appropriate: allergies, current medications, past family history, past medical history, past social history, past surgical history and problem list.   Objective:   Vitals:   03/09/23 1453  BP: 111/71  Pulse: 84  Weight: 210 lb 9.6 oz (95.5 kg)    Fetal Status:     Movement: Present     General:  Alert, oriented and cooperative. Patient is in no acute distress.  Skin: Skin is warm and dry. No rash noted.   Cardiovascular: Normal heart rate noted  Respiratory: Normal respiratory effort, no problems with respiration noted  Abdomen: Soft, gravid, appropriate for gestational age.  Pain/Pressure: Absent     Pelvic: Cervical exam deferred        Extremities: Normal range of motion.  Edema: None  Mental Status: Normal mood and affect. Normal behavior. Normal judgment and thought content.   Assessment and Plan:  Pregnancy: G3P1011 at [redacted]w[redacted]d  1. [redacted] weeks gestation of pregnancy (Primary)  - Glucose, 1 hour ( This test was requested by patient. She has been counseled that if abnormal a 3 hr GTT will be advised) - CBC - HIV antibody (with reflex) - RPR - aspirin EC 81 MG tablet; Take 1 tablet (81 mg total) by mouth daily. Swallow whole.  Dispense: 30 tablet; Refill: 12 ( d/w patient including S/R/B and she is agreeable to taking daily)    2. History of cesarean delivery - desires TOLAC  - desires BTL   3. Obesity affecting pregnancy, antepartum, unspecified obesity type - BMI >40 - fetal growth q 4 wk@ 24 weeks per MFM guidelines - antenatal testing q week at 34 wks per MFM guidelines - aspirin EC 81 MG tablet; Take 1 tablet (81 mg total) by mouth daily. Swallow whole.  Dispense: 30 tablet; Refill: 12  4. Supervision of other normal pregnancy, antepartum The patient has the following indications for aspirin to begin 81 mg at 12-16 weeks: One high risk condition: no single high risk condition  MORE than one moderate risk condition: obesity and identifies as African American  Aspirin was  recommended today based upon above risk factors (one high risk condition or more than one moderate risk factor)   . Preterm labor symptoms and general obstetric precautions including but not limited to vaginal bleeding, contractions, leaking of fluid and fetal movement were reviewed in detail with the patient. Please refer to After Visit Summary for other counseling recommendations.   Return in about 4 weeks (around 04/06/2023) for LOB.  Future Appointments  Date Time Provider Department Center  03/23/2023 10:35 AM Brock Bad, MD [facility] None  04/02/2023 11:15 AM [facility] NURSE [facility]  04/02/2023 11:30 AM [facility] US2 [facility]   Orders Placed This Encounter  Procedures   Glucose, 1 hour   CBC   HIV antibody (with reflex)   RPR

## 2023-03-10 LAB — CBC
Hematocrit: 30 % — ABNORMAL LOW (ref 34.0–46.6)
Hemoglobin: 10 g/dL — ABNORMAL LOW (ref 11.1–15.9)
MCH: 28.7 pg (ref 26.6–33.0)
MCHC: 33.3 g/dL (ref 31.5–35.7)
MCV: 86 fL (ref 79–97)
Platelets: 224 10*3/uL (ref 150–450)
RBC: 3.49 x10E6/uL — ABNORMAL LOW (ref 3.77–5.28)
RDW: 13.3 % (ref 11.7–15.4)
WBC: 7.9 10*3/uL (ref 3.4–10.8)

## 2023-03-10 LAB — RPR: RPR Ser Ql: NONREACTIVE

## 2023-03-10 LAB — GLUCOSE, 1 HOUR GESTATIONAL: Gestational Diabetes Screen: 128 mg/dL (ref 70–139)

## 2023-03-10 LAB — HIV ANTIBODY (ROUTINE TESTING W REFLEX): HIV Screen 4th Generation wRfx: NONREACTIVE

## 2023-03-12 ENCOUNTER — Encounter: Payer: Self-pay | Admitting: Nurse Practitioner

## 2023-03-12 ENCOUNTER — Other Ambulatory Visit: Payer: Self-pay | Admitting: Nurse Practitioner

## 2023-03-12 DIAGNOSIS — O99011 Anemia complicating pregnancy, first trimester: Secondary | ICD-10-CM

## 2023-03-12 MED ORDER — FERROUS SULFATE 325 (65 FE) MG PO TABS: 325.0000 mg | ORAL_TABLET | ORAL | 1 refills | Status: AC

## 2023-03-12 NOTE — Progress Notes (Signed)
See Result note

## 2023-03-23 ENCOUNTER — Ambulatory Visit (INDEPENDENT_AMBULATORY_CARE_PROVIDER_SITE_OTHER): Payer: Medicaid Other | Admitting: Obstetrics

## 2023-03-23 ENCOUNTER — Encounter: Payer: Self-pay | Admitting: Obstetrics

## 2023-03-23 VITALS — BP 114/70 | HR 110 | Wt 215.6 lb

## 2023-03-23 DIAGNOSIS — K5900 Constipation, unspecified: Secondary | ICD-10-CM | POA: Diagnosis not present

## 2023-03-23 DIAGNOSIS — O99019 Anemia complicating pregnancy, unspecified trimester: Secondary | ICD-10-CM | POA: Diagnosis not present

## 2023-03-23 DIAGNOSIS — O99210 Obesity complicating pregnancy, unspecified trimester: Secondary | ICD-10-CM | POA: Diagnosis not present

## 2023-03-23 DIAGNOSIS — Z98891 History of uterine scar from previous surgery: Secondary | ICD-10-CM

## 2023-03-23 DIAGNOSIS — Z3480 Encounter for supervision of other normal pregnancy, unspecified trimester: Secondary | ICD-10-CM | POA: Diagnosis not present

## 2023-03-23 NOTE — Progress Notes (Signed)
 Subjective:  Kristina Alvarado is a 33 y.o. G3P1011 at [redacted]w[redacted]d being seen today for ongoing prenatal care.  She is currently monitored for the following issues for this low-risk pregnancy and has Abnormal TSH; Acute left-sided low back pain without sciatica; Fatigue; Vitamin D  deficiency; Supervision of other normal pregnancy, antepartum; History of cesarean delivery; and Obesity affecting pregnancy, antepartum on their problem list.  Patient reports heartburn.  Contractions: Not present. Vag. Bleeding: None.  Movement: Present. Denies leaking of fluid.   The following portions of the patient's history were reviewed and updated as appropriate: allergies, current medications, past family history, past medical history, past social history, past surgical history and problem list. Problem list updated.  Objective:   Vitals:   03/23/23 1049  BP: 114/70  Pulse: (!) 110  Weight: 215 lb 9.6 oz (97.8 kg)    Fetal Status: Fetal Heart Rate (bpm): 143   Movement: Present     General:  Alert, oriented and cooperative. Patient is in no acute distress.  Skin: Skin is warm and dry. No rash noted.   Cardiovascular: Normal heart rate noted  Respiratory: Normal respiratory effort, no problems with respiration noted  Abdomen: Soft, gravid, appropriate for gestational age. Pain/Pressure: Absent     Pelvic:  Cervical exam deferred        Extremities: Normal range of motion.  Edema: None  Mental Status: Normal mood and affect. Normal behavior. Normal judgment and thought content.   Urinalysis:      Assessment and Plan:  Pregnancy: G3P1011 at [redacted]w[redacted]d  1. Supervision of other normal pregnancy, antepartum (Primary)  2. History of cesarean delivery - desires VBAC and PPTL - VBAC consent discussed and signed - tubal consent signed  3. Antepartum anemia - FeSO4  Rx  4. Obesity affecting pregnancy, antepartum, unspecified obesity type - taking Baby ASA 5. Constipation, unspecified constipation type -  Colace Rx.  Increase fluids and fiber  Preterm labor symptoms and general obstetric precautions including but not limited to vaginal bleeding, contractions, leaking of fluid and fetal movement were reviewed in detail with the patient. Please refer to After Visit Summary for other counseling recommendations.   Return in about 2 weeks (around 04/06/2023) for ROB.   Gabrielle Joiner, MD 03/23/2023

## 2023-03-23 NOTE — Progress Notes (Signed)
 Pt presents for ROB visit. No concerns. BTL and VBAC consents signed today

## 2023-04-02 ENCOUNTER — Ambulatory Visit: Payer: Medicaid Other | Attending: Maternal & Fetal Medicine | Admitting: *Deleted

## 2023-04-02 ENCOUNTER — Ambulatory Visit: Payer: Medicaid Other

## 2023-04-02 VITALS — BP 110/55 | HR 106

## 2023-04-02 DIAGNOSIS — E669 Obesity, unspecified: Secondary | ICD-10-CM | POA: Diagnosis not present

## 2023-04-02 DIAGNOSIS — O09293 Supervision of pregnancy with other poor reproductive or obstetric history, third trimester: Secondary | ICD-10-CM | POA: Diagnosis not present

## 2023-04-02 DIAGNOSIS — Z362 Encounter for other antenatal screening follow-up: Secondary | ICD-10-CM | POA: Insufficient documentation

## 2023-04-02 DIAGNOSIS — O34219 Maternal care for unspecified type scar from previous cesarean delivery: Secondary | ICD-10-CM | POA: Diagnosis not present

## 2023-04-02 DIAGNOSIS — Z3A32 32 weeks gestation of pregnancy: Secondary | ICD-10-CM | POA: Insufficient documentation

## 2023-04-02 DIAGNOSIS — O99213 Obesity complicating pregnancy, third trimester: Secondary | ICD-10-CM | POA: Diagnosis not present

## 2023-04-02 DIAGNOSIS — O99210 Obesity complicating pregnancy, unspecified trimester: Secondary | ICD-10-CM

## 2023-04-06 ENCOUNTER — Ambulatory Visit (INDEPENDENT_AMBULATORY_CARE_PROVIDER_SITE_OTHER): Payer: Medicaid Other | Admitting: Family Medicine

## 2023-04-06 VITALS — BP 98/60 | HR 106 | Wt 218.6 lb

## 2023-04-06 DIAGNOSIS — Z98891 History of uterine scar from previous surgery: Secondary | ICD-10-CM

## 2023-04-06 DIAGNOSIS — Z3A32 32 weeks gestation of pregnancy: Secondary | ICD-10-CM

## 2023-04-06 DIAGNOSIS — R7989 Other specified abnormal findings of blood chemistry: Secondary | ICD-10-CM | POA: Diagnosis not present

## 2023-04-06 DIAGNOSIS — Z3480 Encounter for supervision of other normal pregnancy, unspecified trimester: Secondary | ICD-10-CM | POA: Diagnosis not present

## 2023-04-06 DIAGNOSIS — O99210 Obesity complicating pregnancy, unspecified trimester: Secondary | ICD-10-CM

## 2023-04-06 NOTE — Progress Notes (Signed)
   PRENATAL VISIT NOTE  Subjective:  Kristina Alvarado is a 33 y.o. G3P1011 at [redacted]w[redacted]d being seen today for ongoing prenatal care.  She is currently monitored for the following issues for this low-risk pregnancy and has Abnormal TSH; Acute left-sided low back pain without sciatica; Fatigue; Vitamin D deficiency; Supervision of other normal pregnancy, antepartum; History of cesarean delivery; and Obesity affecting pregnancy, antepartum on their problem list.  Patient reports no complaints.  Contractions: Not present. Vag. Bleeding: None.  Movement: Present. Denies leaking of fluid.   The following portions of the patient's history were reviewed and updated as appropriate: allergies, current medications, past family history, past medical history, past social history, past surgical history and problem list.   Objective:   Vitals:   04/06/23 1048  BP: 98/60  Pulse: (!) 106  Weight: 218 lb 9.6 oz (99.2 kg)    Fetal Status: Fetal Heart Rate (bpm): 145 Fundal Height: 32 cm Movement: Present     General:  Alert, oriented and cooperative. Patient is in no acute distress.  Skin: Skin is warm and dry. No rash noted.   Cardiovascular: Normal heart rate noted  Respiratory: Normal respiratory effort, no problems with respiration noted  Abdomen: Soft, gravid, appropriate for gestational age.  Pain/Pressure: Present     Pelvic: Cervical exam deferred        Extremities: Normal range of motion.  Edema: Trace (legs and feet)  Mental Status: Normal mood and affect. Normal behavior. Normal judgment and thought content.   Assessment and Plan:  Pregnancy: G3P1011 at [redacted]w[redacted]d  Supervision of other normal pregnancy, antepartum  [redacted] weeks gestation of pregnancy Prenatal course reviewed BP, HR, FHR normal Feeling regular FM   History of cesarean delivery Considering repeat C/S vs TOLAC -- TOLAC papers signed at last visit Will let us know her decision at next visit so rCS can be scheduled if she opts for  this  Obesity affecting pregnancy, antepartum, unspecified obesity type Pregravid BMI 39 TWG: 20 lb 9.6 oz (9.344 kg)  Preterm labor symptoms and general obstetric precautions including but not limited to vaginal bleeding, contractions, leaking of fluid and fetal movement were reviewed in detail with the patient. Please refer to After Visit Summary for other counseling recommendations.   Return in about 2 weeks (around 04/20/2023) for Routine prenatal care, In person.  Future Appointments  Date Time Provider Department Center  04/20/2023 10:35 AM [name], Jethro Bastos, MD [facility] None  05/04/2023 10:35 AM Allie Bossier, MD [facility] None    Sundra Aland, MD

## 2023-04-06 NOTE — Progress Notes (Signed)
 Pt. Presents for rob. Pt has no questions or concerns for today.

## 2023-04-20 ENCOUNTER — Encounter: Payer: Self-pay | Admitting: Obstetrics & Gynecology

## 2023-04-20 ENCOUNTER — Ambulatory Visit (INDEPENDENT_AMBULATORY_CARE_PROVIDER_SITE_OTHER): Payer: Medicaid Other | Admitting: Obstetrics & Gynecology

## 2023-04-20 VITALS — BP 127/79 | HR 96 | Wt 224.0 lb

## 2023-04-20 DIAGNOSIS — O99210 Obesity complicating pregnancy, unspecified trimester: Secondary | ICD-10-CM | POA: Diagnosis not present

## 2023-04-20 DIAGNOSIS — Z3480 Encounter for supervision of other normal pregnancy, unspecified trimester: Secondary | ICD-10-CM | POA: Diagnosis not present

## 2023-04-20 DIAGNOSIS — Z3A34 34 weeks gestation of pregnancy: Secondary | ICD-10-CM

## 2023-04-20 DIAGNOSIS — Z98891 History of uterine scar from previous surgery: Secondary | ICD-10-CM

## 2023-04-20 NOTE — Patient Instructions (Signed)

## 2023-04-20 NOTE — Progress Notes (Signed)
 PRENATAL VISIT NOTE  Subjective:  Kristina Alvarado is a 33 y.o. G3P1011 at [redacted]w[redacted]d being seen today for ongoing prenatal care.  She is currently monitored for the following issues for this low-risk pregnancy and has Abnormal TSH; Acute left-sided low back pain without sciatica; Fatigue; Vitamin D deficiency; Supervision of other normal pregnancy, antepartum; History of cesarean delivery; and Obesity affecting pregnancy, antepartum on their problem list.  Patient reports no complaints.  Contractions: Not present. Vag. Bleeding: None.  Movement: Present. Denies leaking of fluid.   The following portions of the patient's history were reviewed and updated as appropriate: allergies, current medications, past family history, past medical history, past social history, past surgical history and problem list.   Objective:   Vitals:   04/20/23 1102  BP: 127/79  Pulse: 96  Weight: 224 lb (101.6 kg)    Fetal Status:     Movement: Present     General:  Alert, oriented and cooperative. Patient is in no acute distress.  Skin: Skin is warm and dry. No rash noted.   Cardiovascular: Normal heart rate noted  Respiratory: Normal respiratory effort, no problems with respiration noted  Abdomen: Soft, gravid, appropriate for gestational age.  Pain/Pressure: Present     Pelvic: Cervical exam deferred        Extremities: Normal range of motion.     Mental Status: Normal mood and affect. Normal behavior. Normal judgment and thought content.   [place] MFM OB FOLLOW UP Result Date: 04/02/2023 ----------------------------------------------------------------------  OBSTETRICS REPORT                       (Signed Final 04/02/2023 01:53 pm) ---------------------------------------------------------------------- Patient Info  ID #:       [id number]                          D.O.B.:  Jul 01, 1990 (32 yrs)(F)  Name:       Kristina Alvarado              Visit Date: 04/02/2023 11:17 am  ---------------------------------------------------------------------- Performed By  Attending:        Lin Landsman      Ref. Address:     [address]                    [address]                                                             Road                                                             [address]                                                             [place]  [address]  Performed By:     Anabel Halon          [facility]  Referred By:      [facility] ---------------------------------------------------------------------- Orders  #  Description                           Code        Ordered By  1  [place] MFM OB FOLLOW UP                   60454.09    Braxton Feathers ----------------------------------------------------------------------  #  Order #                     Accession #                Episode #  1  [id number]                   [id number]                 [id number] ---------------------------------------------------------------------- Indications  Obesity complicating pregnancy, third          O99.213  trimester (BMI 39)  [redacted] weeks gestation of pregnancy                Z3A.32  Encounter for other antenatal screening        Z36.2  follow-up  History of cesarean delivery, currently        O34.219  pregnant  Poor obstetric history: Previous fetal growth  O09.299  restriction (FGR)  Low Risk NIPS   Neg Horizon (2020) ---------------------------------------------------------------------- Fetal Evaluation  Num Of Fetuses:         1  Fetal Heart Rate(bpm):  143  Cardiac Activity:       Observed  Presentation:           Cephalic  Placenta:               Posterior  P. Cord Insertion:      Previously seen   Amniotic Fluid  AFI FV:      Within normal limits  AFI Sum(cm)     %Tile       Largest Pocket(cm)  10.91           23  3.16  RUQ(cm)       RLQ(cm)       LUQ(cm)        LLQ(cm)  3.16          2.99          1.77           2.99 ---------------------------------------------------------------------- Biometry  BPD:      78.8  mm     G. Age:  31w 4d         30  %    CI:        78.86   %    70 - 86                                                          FL/HC:      22.8   %    19.1 - 21.3  HC:      280.6  mm     G. Age:  30w 5d        2.1  %    HC/AC:      1.00        0.96 - 1.17  AC:      280.6  mm     G. Age:  32w 1d         52  %    FL/BPD:     81.1   %    71 - 87  FL:       63.9  mm     G. Age:  33w 0d         65  %    FL/AC:      22.8   %    20 - 24  Est. FW:    1921  gm      4 lb 4 oz     45  % ---------------------------------------------------------------------- OB History  Gravidity:    3         Term:   1         SAB:   1  Living:       1 ---------------------------------------------------------------------- Gestational Age  LMP:           33w 1d        Date:  08/13/22                   EDD:   05/20/23  U/S Today:     31w 6d                                        EDD:   05/29/23  Best:          Kristina Alvarado 0d     Det. ByMarcella Dubs         EDD:   05/28/23                                      (10/06/22) ---------------------------------------------------------------------- Anatomy  Diaphragm:             Appears normal         Kidneys:  Appear normal  Stomach:               Appears normal, left   Bladder:                Appears normal                         sided ---------------------------------------------------------------------- Cervix Uterus Adnexa  Cervix  Not visualized (advanced GA >24wks) ---------------------------------------------------------------------- Impression  Follow up growth due to elevated BMI  Normal interval growth with measurements consistent with  dates  Good fetal  movement and amniotic fluid volume ---------------------------------------------------------------------- Recommendations  Follow up growth as clinically indicated. ----------------------------------------------------------------------              Lin Landsman, MD Electronically Signed Final Report   04/02/2023 01:53 pm ----------------------------------------------------------------------    Assessment and Plan:  Pregnancy: G3P1011 at [redacted]w[redacted]d 1. History of cesarean delivery (Primary) Counseled regarding TOLAC vs RCS; risks/benefits discussed in detail. All questions answered.  Patient elects for RCS/BTL, consent signed 04/20/2023. Message sent to Surgical Scheduler to schedule RCS and BTL at 39 weeks. - Ambulatory Referral For Surgery Scheduling  2. Obesity affecting pregnancy, antepartum, unspecified obesity type Normal growth scna on 04/02/23, next growth scan ordered.  - [place] MFM OB FOLLOW UP; Future  3. [redacted] weeks gestation of pregnancy 4. Supervision of other normal pregnancy, antepartum No other concerns.  Preterm labor symptoms and general obstetric precautions including but not limited to vaginal bleeding, contractions, leaking of fluid and fetal movement were reviewed in detail with the patient. Please refer to After Visit Summary for other counseling recommendations.   Return in about 2 weeks (around 05/04/2023) for Pelvic cultures, OFFICE OB VISIT ([facility] only).  Future Appointments  Date Time Provider Department Center  05/04/2023 10:35 AM Allie Bossier, MD [facility] None    Jaynie Collins, MD

## 2023-05-04 ENCOUNTER — Other Ambulatory Visit: Payer: Self-pay | Admitting: Family Medicine

## 2023-05-04 ENCOUNTER — Encounter: Payer: Self-pay | Admitting: Obstetrics

## 2023-05-04 ENCOUNTER — Ambulatory Visit (INDEPENDENT_AMBULATORY_CARE_PROVIDER_SITE_OTHER): Payer: Medicaid Other | Admitting: Obstetrics

## 2023-05-04 ENCOUNTER — Other Ambulatory Visit (HOSPITAL_COMMUNITY)
Admission: RE | Admit: 2023-05-04 | Discharge: 2023-05-04 | Disposition: A | Discharge status: Home / Self Care | Source: Ambulatory Visit | Attending: Obstetrics & Gynecology | Admitting: Obstetrics & Gynecology

## 2023-05-04 VITALS — BP 117/68 | HR 104 | Wt 224.7 lb

## 2023-05-04 DIAGNOSIS — O99210 Obesity complicating pregnancy, unspecified trimester: Secondary | ICD-10-CM

## 2023-05-04 DIAGNOSIS — Z3480 Encounter for supervision of other normal pregnancy, unspecified trimester: Secondary | ICD-10-CM

## 2023-05-04 DIAGNOSIS — O99011 Anemia complicating pregnancy, first trimester: Secondary | ICD-10-CM

## 2023-05-04 DIAGNOSIS — Z98891 History of uterine scar from previous surgery: Secondary | ICD-10-CM

## 2023-05-04 NOTE — Progress Notes (Signed)
 Subjective:  Kristina Alvarado is a 33 y.o. G3P1011 at [redacted]w[redacted]d being seen today for ongoing prenatal care.  She is currently monitored for the following issues for this low-risk pregnancy and has Abnormal TSH; Acute left-sided low back pain without sciatica; Fatigue; Vitamin D deficiency; Supervision of other normal pregnancy, antepartum; History of cesarean delivery; and Obesity affecting pregnancy, antepartum on their problem list.  Patient reports backache, fatigue, and occasional contractions.  Contractions: Not present. Vag. Bleeding: None.  Movement: Present. Denies leaking of fluid.   The following portions of the patient's history were reviewed and updated as appropriate: allergies, current medications, past family history, past medical history, past social history, past surgical history and problem list. Problem list updated.  Objective:   Vitals:   05/04/23 1058  BP: 117/68  Pulse: (!) 104  Weight: 224 lb 11.2 oz (101.9 kg)    Fetal Status: Fetal Heart Rate (bpm): 148   Movement: Present     General:  Alert, oriented and cooperative. Patient is in no acute distress.  Skin: Skin is warm and dry. No rash noted.   Cardiovascular: Normal heart rate noted  Respiratory: Normal respiratory effort, no problems with respiration noted  Abdomen: Soft, gravid, appropriate for gestational age. Pain/Pressure: Present     Pelvic:  Cervical exam deferred        Extremities: Normal range of motion.  Edema: Trace (Feet and Hands)  Mental Status: Normal mood and affect. Normal behavior. Normal judgment and thought content.   Urinalysis:      Assessment and Plan:  Pregnancy: G3P1011 at [redacted]w[redacted]d  1. Supervision of other normal pregnancy, antepartum (Primary) Rx: - Culture, beta strep (group b only) - Cervicovaginal ancillary only( [place])  2. History of cesarean delivery - desires repeat C/S and BTL  3. Anemia during pregnancy in first trimester  4. Obesity affecting pregnancy,  antepartum, unspecified obesity type     Preterm labor symptoms and general obstetric precautions including but not limited to vaginal bleeding, contractions, leaking of fluid and fetal movement were reviewed in detail with the patient. Please refer to After Visit Summary for other counseling recommendations.   Return in about 1 week (around 05/11/2023) for ROB.   Brock Bad, MD 05/04/2023

## 2023-05-04 NOTE — Progress Notes (Signed)
 Pt presents for rob. Pt needs gbs and swab today. Pt would like to move her C- section up. No other questions or concerns at this time.

## 2023-05-05 LAB — CERVICOVAGINAL ANCILLARY ONLY
Bacterial Vaginitis (gardnerella): POSITIVE — AB
Candida Glabrata: NEGATIVE
Candida Vaginitis: NEGATIVE
Chlamydia: NEGATIVE
Comment: NEGATIVE
Comment: NEGATIVE
Comment: NEGATIVE
Comment: NEGATIVE
Comment: NEGATIVE
Comment: NORMAL
Neisseria Gonorrhea: NEGATIVE
Trichomonas: NEGATIVE

## 2023-05-06 ENCOUNTER — Other Ambulatory Visit: Payer: Self-pay | Admitting: Obstetrics

## 2023-05-06 DIAGNOSIS — B9689 Other specified bacterial agents as the cause of diseases classified elsewhere: Secondary | ICD-10-CM

## 2023-05-06 MED ORDER — METRONIDAZOLE 500 MG PO TABS: 500.0000 mg | ORAL_TABLET | Freq: Two times a day (BID) | ORAL | 2 refills | Status: DC

## 2023-05-08 ENCOUNTER — Ambulatory Visit: Attending: Obstetrics & Gynecology

## 2023-05-08 ENCOUNTER — Ambulatory Visit: Admitting: *Deleted

## 2023-05-08 VITALS — BP 130/81 | HR 100

## 2023-05-08 DIAGNOSIS — O09293 Supervision of pregnancy with other poor reproductive or obstetric history, third trimester: Secondary | ICD-10-CM | POA: Insufficient documentation

## 2023-05-08 DIAGNOSIS — Z3A37 37 weeks gestation of pregnancy: Secondary | ICD-10-CM | POA: Insufficient documentation

## 2023-05-08 DIAGNOSIS — O99210 Obesity complicating pregnancy, unspecified trimester: Secondary | ICD-10-CM | POA: Insufficient documentation

## 2023-05-08 DIAGNOSIS — O34219 Maternal care for unspecified type scar from previous cesarean delivery: Secondary | ICD-10-CM | POA: Insufficient documentation

## 2023-05-08 DIAGNOSIS — Z3480 Encounter for supervision of other normal pregnancy, unspecified trimester: Secondary | ICD-10-CM | POA: Insufficient documentation

## 2023-05-08 DIAGNOSIS — Z98891 History of uterine scar from previous surgery: Secondary | ICD-10-CM | POA: Insufficient documentation

## 2023-05-08 DIAGNOSIS — O99213 Obesity complicating pregnancy, third trimester: Secondary | ICD-10-CM | POA: Diagnosis not present

## 2023-05-08 DIAGNOSIS — E669 Obesity, unspecified: Secondary | ICD-10-CM | POA: Diagnosis not present

## 2023-05-08 LAB — CULTURE, BETA STREP (GROUP B ONLY): Strep Gp B Culture: NEGATIVE

## 2023-05-11 ENCOUNTER — Encounter: Payer: Self-pay | Admitting: Obstetrics

## 2023-05-11 ENCOUNTER — Ambulatory Visit (INDEPENDENT_AMBULATORY_CARE_PROVIDER_SITE_OTHER): Admitting: Obstetrics

## 2023-05-11 VITALS — BP 132/80 | HR 107 | Wt 225.5 lb

## 2023-05-11 DIAGNOSIS — Z3480 Encounter for supervision of other normal pregnancy, unspecified trimester: Secondary | ICD-10-CM | POA: Diagnosis not present

## 2023-05-11 DIAGNOSIS — O99210 Obesity complicating pregnancy, unspecified trimester: Secondary | ICD-10-CM

## 2023-05-11 DIAGNOSIS — Z98891 History of uterine scar from previous surgery: Secondary | ICD-10-CM | POA: Diagnosis not present

## 2023-05-11 DIAGNOSIS — O99011 Anemia complicating pregnancy, first trimester: Secondary | ICD-10-CM

## 2023-05-11 NOTE — Progress Notes (Signed)
 Subjective:  Kristina Alvarado is a 33 y.o. G3P1011 at [redacted]w[redacted]d being seen today for ongoing prenatal care.  She is currently monitored for the following issues for this low-risk pregnancy and has Abnormal TSH; Acute left-sided low back pain without sciatica; Fatigue; Vitamin D deficiency; Supervision of other normal pregnancy, antepartum; History of cesarean delivery; and Obesity affecting pregnancy, antepartum on their problem list.  Patient reports no complaints.  Contractions: Irritability. Vag. Bleeding: None.  Movement: Present. Denies leaking of fluid.   The following portions of the patient's history were reviewed and updated as appropriate: allergies, current medications, past family history, past medical history, past social history, past surgical history and problem list. Problem list updated.  Objective:   Vitals:   05/11/23 1337  BP: 132/80  Pulse: (!) 107  Weight: 225 lb 8 oz (102.3 kg)    Fetal Status: Fetal Heart Rate (bpm): 145   Movement: Present     General:  Alert, oriented and cooperative. Patient is in no acute distress.  Skin: Skin is warm and dry. No rash noted.   Cardiovascular: Normal heart rate noted  Respiratory: Normal respiratory effort, no problems with respiration noted  Abdomen: Soft, gravid, appropriate for gestational age. Pain/Pressure: Present     Pelvic:  Cervical exam deferred        Extremities: Normal range of motion.  Edema: Trace  Mental Status: Normal mood and affect. Normal behavior. Normal judgment and thought content.   Urinalysis:      Assessment and Plan:  Pregnancy: G3P1011 at [redacted]w[redacted]d  1. Supervision of other normal pregnancy, antepartum (Primary)  2. History of cesarean delivery - desires repeat C/S  3. Anemia during pregnancy in first trimester  4. Obesity affecting pregnancy, antepartum, unspecified obesity type    There are no diagnoses linked to this encounter. Term labor symptoms and general obstetric precautions including  but not limited to vaginal bleeding, contractions, leaking of fluid and fetal movement were reviewed in detail with the patient. Please refer to After Visit Summary for other counseling recommendations.   Return in about 1 week (around 05/18/2023) for ROB.   Brock Bad, MD 05/11/2023

## 2023-05-11 NOTE — Progress Notes (Signed)
 Pt presents for rob. Pt has no questions or concerns at this time.

## 2023-05-12 ENCOUNTER — Encounter (HOSPITAL_COMMUNITY): Payer: Self-pay

## 2023-05-12 ENCOUNTER — Telehealth (HOSPITAL_COMMUNITY): Payer: Self-pay | Admitting: *Deleted

## 2023-05-12 NOTE — Telephone Encounter (Signed)
 Preadmission screen

## 2023-05-12 NOTE — Patient Instructions (Signed)
 Kristina Alvarado  05/12/2023   Your procedure is scheduled on:  05/22/2023  Arrive at 0930 at Entrance C on [address] at [facility]  and [employer]. You are invited to use the FREE valet parking or use the Visitor's parking deck.  Pick up the phone at the desk and dial [phone].  Call this number if you have problems the morning of surgery: [phone]  Remember:   Do not eat food:(After Midnight) Desps de medianoche.  You may drink clear liquids until arrival at ___0930__.  Clear liquids means a liquid you can see thru.  It can have color such as Cola or Kool aid.  Tea is OK and coffee as long as no milk or creamer of any kind.  Take these medicines the morning of surgery with A SIP OF WATER:  none   Do not wear jewelry, make-up or nail polish.  Do not wear lotions, powders, or perfumes. Do not wear deodorant.  Do not shave 48 hours prior to surgery.  Do not bring valuables to the hospital.  [facility] is not   responsible for any belongings or valuables brought to the hospital.  Contacts, dentures or bridgework may not be worn into surgery.  Leave suitcase in the car. After surgery it may be brought to your room.  For patients admitted to the hospital, checkout time is 11:00 AM the day of              discharge.      Please read over the following fact sheets that you were given:     Preparing for Surgery

## 2023-05-18 ENCOUNTER — Inpatient Hospital Stay (HOSPITAL_COMMUNITY)
Admission: AD | Admit: 2023-05-18 | Discharge: 2023-05-18 | Disposition: A | Discharge status: Home / Self Care | Attending: Obstetrics and Gynecology | Admitting: Obstetrics and Gynecology

## 2023-05-18 ENCOUNTER — Encounter (HOSPITAL_COMMUNITY): Payer: Self-pay | Admitting: Obstetrics and Gynecology

## 2023-05-18 ENCOUNTER — Ambulatory Visit (INDEPENDENT_AMBULATORY_CARE_PROVIDER_SITE_OTHER): Admitting: Obstetrics and Gynecology

## 2023-05-18 VITALS — BP 143/88 | HR 99 | Wt 229.4 lb

## 2023-05-18 DIAGNOSIS — R519 Headache, unspecified: Secondary | ICD-10-CM | POA: Diagnosis present

## 2023-05-18 DIAGNOSIS — R030 Elevated blood-pressure reading, without diagnosis of hypertension: Secondary | ICD-10-CM | POA: Insufficient documentation

## 2023-05-18 DIAGNOSIS — O99210 Obesity complicating pregnancy, unspecified trimester: Secondary | ICD-10-CM

## 2023-05-18 DIAGNOSIS — O99893 Other specified diseases and conditions complicating puerperium: Secondary | ICD-10-CM | POA: Insufficient documentation

## 2023-05-18 DIAGNOSIS — Z3480 Encounter for supervision of other normal pregnancy, unspecified trimester: Secondary | ICD-10-CM | POA: Diagnosis not present

## 2023-05-18 DIAGNOSIS — G44219 Episodic tension-type headache, not intractable: Secondary | ICD-10-CM

## 2023-05-18 DIAGNOSIS — O163 Unspecified maternal hypertension, third trimester: Secondary | ICD-10-CM

## 2023-05-18 DIAGNOSIS — Z3A38 38 weeks gestation of pregnancy: Secondary | ICD-10-CM | POA: Diagnosis not present

## 2023-05-18 DIAGNOSIS — Z98891 History of uterine scar from previous surgery: Secondary | ICD-10-CM | POA: Diagnosis not present

## 2023-05-18 LAB — CBC
HCT: 31.1 % — ABNORMAL LOW (ref 36.0–46.0)
Hemoglobin: 10.2 g/dL — ABNORMAL LOW (ref 12.0–15.0)
MCH: 28.3 pg (ref 26.0–34.0)
MCHC: 32.8 g/dL (ref 30.0–36.0)
MCV: 86.4 fL (ref 80.0–100.0)
Platelets: 242 10*3/uL (ref 150–400)
RBC: 3.6 MIL/uL — ABNORMAL LOW (ref 3.87–5.11)
RDW: 15.4 % (ref 11.5–15.5)
WBC: 7 10*3/uL (ref 4.0–10.5)
nRBC: 0 % (ref 0.0–0.2)

## 2023-05-18 LAB — COMPREHENSIVE METABOLIC PANEL WITH GFR
ALT: 19 U/L (ref 0–44)
AST: 22 U/L (ref 15–41)
Albumin: 2.6 g/dL — ABNORMAL LOW (ref 3.5–5.0)
Alkaline Phosphatase: 88 U/L (ref 38–126)
Anion gap: 9 (ref 5–15)
BUN: 11 mg/dL (ref 6–20)
CO2: 21 mmol/L — ABNORMAL LOW (ref 22–32)
Calcium: 8.8 mg/dL — ABNORMAL LOW (ref 8.9–10.3)
Chloride: 106 mmol/L (ref 98–111)
Creatinine, Ser: 0.76 mg/dL (ref 0.44–1.00)
GFR, Estimated: >60 mL/min (ref >60)
Glucose, Bld: 82 mg/dL (ref 70–99)
Potassium: 3.9 mmol/L (ref 3.5–5.1)
Sodium: 136 mmol/L (ref 135–145)
Total Bilirubin: 0.4 mg/dL (ref 0.0–1.2)
Total Protein: 6.2 g/dL — ABNORMAL LOW (ref 6.5–8.1)

## 2023-05-18 LAB — URINALYSIS, ROUTINE W REFLEX MICROSCOPIC
Bacteria, UA: NONE SEEN
Bilirubin Urine: NEGATIVE
Glucose, UA: NEGATIVE mg/dL
Hgb urine dipstick: NEGATIVE
Ketones, ur: NEGATIVE mg/dL
Nitrite: NEGATIVE
Protein, ur: NEGATIVE mg/dL
Specific Gravity, Urine: 1.021 (ref 1.005–1.030)
pH: 6 (ref 5.0–8.0)

## 2023-05-18 LAB — PROTEIN / CREATININE RATIO, URINE
Creatinine, Urine: 176 mg/dL
Protein Creatinine Ratio: 0.11 mg/mg{creat} (ref 0.00–0.15)
Total Protein, Urine: 20 mg/dL

## 2023-05-18 MED ORDER — ACETAMINOPHEN-CAFFEINE 500-65 MG PO TABS
2.0000 | ORAL_TABLET | Freq: Once | ORAL | Status: AC
  Administered 2023-05-18: 2 via ORAL
  Filled 2023-05-18: qty 2

## 2023-05-18 MED ORDER — METOCLOPRAMIDE HCL 10 MG PO TABS: 10.0000 mg | ORAL_TABLET | Freq: Once | ORAL | Status: DC | PRN

## 2023-05-18 NOTE — MAU Note (Addendum)
.  Kristina Alvarado is a 33 y.o. at [redacted]w[redacted]d here in MAU reporting: sent from office for HBP eval with report of headache. Patient reports waking up with a headache this morning (7/10) and swelling in her feet that started two days ago. She also reports on-going contractions for the past week - reports about two a day and rates them 9/10 when she gets them. She reports less FM than normal today. Denies VB or LOF.  Scheduled for repeat C/S on 4/11.   PIH Assessment: Headache present: Yes ;No treatment attempted Visual disturbances: None RUQ pain/Epigastric: None Atypical edema: ; BLE, that started two days ago Hx of HBP: Patient denies BP Medications: None prescribed  Vitals:   05/18/23 1721 05/18/23 1731  BP: 134/88 130/85  Pulse: 81 66  Resp: 18   Temp:    SpO2: 98%      FHT: 130   Lab orders placed from triage: UA

## 2023-05-18 NOTE — Progress Notes (Signed)
   PRENATAL VISIT NOTE  Subjective:  Kristina Alvarado is a 33 y.o. G3P1011 at [redacted]w[redacted]d being seen today for ongoing prenatal care.  She is currently monitored for the following issues for this low-risk pregnancy and has Abnormal TSH; Acute left-sided low back pain without sciatica; Fatigue; Vitamin D deficiency; Supervision of other normal pregnancy, antepartum; History of cesarean delivery; and Obesity affecting pregnancy, antepartum on their problem list.  Patient reports  headache started today, has not tried anything for it, woke up with it this morning  .  Contractions: Irritability. Vag. Bleeding: None.  Movement: Present. Denies leaking of fluid.   The following portions of the patient's history were reviewed and updated as appropriate: allergies, current medications, past family history, past medical history, past social history, past surgical history and problem list.   Objective:   Vitals:   05/18/23 1551 05/18/23 1555  BP: (!) 136/90 (!) 143/88  Pulse: (!) 108 99  Weight: 229 lb 6.4 oz (104.1 kg)     Fetal Status:     Movement: Present     General:  Alert, oriented and cooperative. Patient is in no acute distress.  Skin: Skin is warm and dry. No rash noted.   Cardiovascular: Normal heart rate noted  Respiratory: Normal respiratory effort, no problems with respiration noted  Abdomen: Soft, gravid, appropriate for gestational age.  Pain/Pressure: Present     Pelvic: Cervical exam deferred        Extremities: Normal range of motion.  Edema: Trace  Mental Status: Normal mood and affect. Normal behavior. Normal judgment and thought content.   Assessment and Plan:  Pregnancy: G3P1011 at [redacted]w[redacted]d 1. Supervision of other normal pregnancy, antepartum (Primary)   2. History of cesarean delivery RCS scheduled Friday 4/11  3. Obesity affecting pregnancy, antepartum, unspecified obesity type 3/28 u/s cephalic normal growth and fluid   4. Elevated BP without diagnosis of  hypertension Elevated today and on repeat with headache, send to MAU for pre-e work up per, confirmed with MD in office. Notified MAU team    Term labor symptoms and general obstetric precautions including but not limited to vaginal bleeding, contractions, leaking of fluid and fetal movement were reviewed in detail with the patient. Please refer to After Visit Summary for other counseling recommendations.     Future Appointments  Date Time Provider Department Center  05/20/2023  9:30 AM [facility] PAT 1 [facility] None    Albertine Grates, FNP

## 2023-05-18 NOTE — MAU Provider Note (Signed)
 HBP w/ HA, CTX    S Kristina Alvarado is a 33 y.o. G72P1011 pregnant female at [redacted]w[redacted]d who presents to MAU today with complaint of being sent from clinic today for mild range Bps in s/o HA.  Pt states she awoke with HA this morning (7/10) and worsening swelling in her feet that she first noticed on 4/5.  For the past week she also states intermittent CTX but since 4/5 have become more painful and now rates them 9/10.  Also states DFM today compared to 4/6.  Denies VB, LOF.  Scheduled for rLTCS 4/11. Denies RUQ pain, CP/SOB.     Receives care at [facility]. Prenatal records reviewed. At 1551 136/90, 1555 143/88  Pertinent items noted in HPI and remainder of comprehensive ROS otherwise negative.   O BP 128/85   Pulse 89   Temp 98.4 F (36.9 C) (Oral)   Resp 18   Ht 4' 11.5" (1.511 m)   Wt 103.5 kg   LMP 08/13/2022   SpO2 99%   BMI 45.32 kg/m  Physical Exam Vitals and nursing note reviewed.  Constitutional:      General: She is not in acute distress.    Appearance: She is well-developed. She is obese. She is not ill-appearing.  HENT:     Head: Normocephalic and atraumatic.     Mouth/Throat:     Mouth: Mucous membranes are moist.  Eyes:     Extraocular Movements: Extraocular movements intact.  Cardiovascular:     Rate and Rhythm: Normal rate.  Pulmonary:     Effort: Pulmonary effort is normal. No respiratory distress.  Abdominal:     General: There is no distension.     Palpations: Abdomen is soft.     Tenderness: There is no abdominal tenderness.     Comments: gravid  Musculoskeletal:        General: Swelling (+1 pitting edema to mid shin) present. Normal range of motion.     Cervical back: Normal range of motion.  Skin:    General: Skin is warm and dry.  Neurological:     Mental Status: She is alert. Mental status is at baseline.  Psychiatric:        Mood and Affect: Mood normal.        Behavior: Behavior normal.   NST: 135bpm, moderate variability, +accels, no  decels, ctx sparse no more than every 12-38mins.    MDM: MAU Course: Normotensive on triage arrival.  Will continue to monitor while awaiting labs and given Excedrin for HA.   UA noninfectious   PreE labs: UPC 0.11 // 242 // 22/19 Cr 0.76, K 3.9  HA improved, baby's movement also improved.    Pt informed that the ultrasound is considered a limited OB ultrasound and is not intended to be a complete ultrasound exam.  Patient also informed that the ultrasound is not being completed with the intent of assessing for fetal or placental anomalies or any pelvic abnormalities.  Explained that the purpose of today's ultrasound is to assess for   movement .  Patient acknowledges the purpose of the exam and the limitations of the study.    My interpretation: verified movement of extremities, cephalic    AP #[redacted] weeks gestation #elevated BP - pt with normotensive vitals here, neg preE labs, no longer symptomatic - BP check in 2-4 days   Discharge from MAU in stable condition with strict/usual precautions Follow up at [facility] as scheduled for ongoing prenatal care  Allergies as of  05/18/2023   No Known Allergies      Medication List     TAKE these medications    aspirin EC 81 MG tablet Take 1 tablet (81 mg total) by mouth daily. Swallow whole.   butalbital-acetaminophen-caffeine 50-325-40 MG tablet Commonly known as: FIORICET Take 1 tablet by mouth every 6 (six) hours as needed for headache or migraine.   docusate sodium 100 MG capsule Commonly known as: Colace Take 1 capsule (100 mg total) by mouth 2 (two) times daily as needed for mild constipation.   Doxylamine-Pyridoxine 10-10 MG Tbec Commonly known as: Diclegis Take 2 tablets by mouth at bedtime. If symptoms persist, add one tablet in the morning and one in the afternoon   ferrous sulfate 325 (65 FE) MG tablet Take 1 tablet (325 mg total) by mouth every other day.   metroNIDAZOLE 500 MG tablet Commonly known as:  FLAGYL Take 1 tablet (500 mg total) by mouth 2 (two) times daily.   ondansetron 4 MG disintegrating tablet Commonly known as: ZOFRAN-ODT Take 1 tablet (4 mg total) by mouth every 6 (six) hours as needed for nausea.   PrePLUS 27-1 MG Tabs Take 1 tablet by mouth daily.   promethazine 25 MG tablet Commonly known as: PHENERGAN Take 1 tablet (25 mg total) by mouth every 6 (six) hours as needed for nausea or vomiting.        Hessie Dibble, MD 05/18/2023 7:08 PM

## 2023-05-18 NOTE — Progress Notes (Signed)
 Pt presents for rob. Pt has no questions or concerns at this time. Pt complains of headache, bp is up 143/88 136/90

## 2023-05-19 NOTE — Anesthesia Preprocedure Evaluation (Signed)
 Anesthesia Evaluation   Patient awake    Reviewed: Allergy & Precautions, NPO status , Patient's Chart, lab work & pertinent test results  Airway Mallampati: III  TM Distance: >3 FB Neck ROM: Full    Dental no notable dental hx.    Pulmonary former smoker   Pulmonary exam normal breath sounds clear to auscultation       Cardiovascular negative cardio ROS Normal cardiovascular exam Rhythm:Regular Rate:Normal     Neuro/Psych negative neurological ROS  negative psych ROS   GI/Hepatic negative GI ROS, Neg liver ROS,,,  Endo/Other    Class 3 obesityBMI 45  Renal/GU negative Renal ROS  negative genitourinary   Musculoskeletal negative musculoskeletal ROS (+)    Abdominal  (+) + obese  Peds  Hematology  (+) Blood dyscrasia, anemia Hb 10.2, plt 242   Anesthesia Other Findings   Reproductive/Obstetrics (+) Pregnancy 1 prior section, 1 prior laparoscopy                              Anesthesia Physical Anesthesia Plan  ASA: 3  Anesthesia Plan: Spinal   Post-op Pain Management: Regional block, Toradol IV (intra-op)* and Ofirmev IV (intra-op)*   Induction:   PONV Risk Score and Plan: 3 and Ondansetron, Dexamethasone and Treatment may vary due to age or medical condition  Airway Management Planned: Natural Airway and Nasal Cannula  Additional Equipment: None  Intra-op Plan:   Post-operative Plan:   Informed Consent: I have reviewed the patients History and Physical, chart, labs and discussed the procedure including the risks, benefits and alternatives for the proposed anesthesia with the patient or authorized representative who has indicated his/her understanding and acceptance.       Plan Discussed with: CRNA  Anesthesia Plan Comments:        Anesthesia Quick Evaluation

## 2023-05-20 ENCOUNTER — Encounter (HOSPITAL_COMMUNITY)
Admission: RE | Admit: 2023-05-20 | Discharge: 2023-05-20 | Disposition: A | Discharge status: Home / Self Care | Source: Ambulatory Visit | Attending: Obstetrics and Gynecology | Admitting: Obstetrics and Gynecology

## 2023-05-20 DIAGNOSIS — Z98891 History of uterine scar from previous surgery: Secondary | ICD-10-CM | POA: Diagnosis not present

## 2023-05-20 DIAGNOSIS — Z01812 Encounter for preprocedural laboratory examination: Secondary | ICD-10-CM | POA: Diagnosis not present

## 2023-05-20 DIAGNOSIS — Z01818 Encounter for other preprocedural examination: Secondary | ICD-10-CM | POA: Diagnosis present

## 2023-05-20 LAB — RAPID HIV SCREEN (HIV 1/2 AB+AG)
HIV 1/2 Antibodies: NONREACTIVE
HIV-1 P24 Antigen - HIV24: NONREACTIVE

## 2023-05-20 LAB — CBC
HCT: 32 % — ABNORMAL LOW (ref 36.0–46.0)
Hemoglobin: 10.5 g/dL — ABNORMAL LOW (ref 12.0–15.0)
MCH: 28.3 pg (ref 26.0–34.0)
MCHC: 32.8 g/dL (ref 30.0–36.0)
MCV: 86.3 fL (ref 80.0–100.0)
Platelets: 238 10*3/uL (ref 150–400)
RBC: 3.71 MIL/uL — ABNORMAL LOW (ref 3.87–5.11)
RDW: 15.7 % — ABNORMAL HIGH (ref 11.5–15.5)
WBC: 6.4 10*3/uL (ref 4.0–10.5)
nRBC: 0 % (ref 0.0–0.2)

## 2023-05-20 LAB — TYPE AND SCREEN
ABO/RH(D): O POS
Antibody Screen: NEGATIVE

## 2023-05-20 LAB — RPR: RPR Ser Ql: NONREACTIVE

## 2023-05-21 ENCOUNTER — Ambulatory Visit

## 2023-05-21 VITALS — BP 124/81 | HR 77 | Wt 227.2 lb

## 2023-05-21 DIAGNOSIS — Z0130 Encounter for examination of blood pressure without abnormal findings: Secondary | ICD-10-CM

## 2023-05-21 NOTE — Progress Notes (Signed)
..  Subjective:  Kristina Alvarado is a 33 y.o. female here for BP check.   Hypertension ROS: taking medications as instructed, no medication side effects noted, no TIA's, no chest pain on exertion, no dyspnea on exertion, and no swelling of ankles.    Objective:  BP 124/81   Pulse 77   Wt 227 lb 3.2 oz (103.1 kg)   LMP 08/13/2022   BMI 45.12 kg/m   Appearance alert, well appearing, and in no distress. General exam BP noted to be well controlled today in office.    Assessment:   Blood Pressure well controlled.   Plan:  Current treatment plan is effective, no change in therapy.. Pt is scheduled for C-section tomorrow. Advised of abnormal symptoms to report and if they occur, report to mau, pt agreed.

## 2023-05-22 ENCOUNTER — Inpatient Hospital Stay (HOSPITAL_COMMUNITY)
Admission: RE | Admit: 2023-05-22 | Discharge: 2023-05-24 | DRG: 784 | Disposition: A | Discharge status: Home / Self Care | Attending: Obstetrics and Gynecology | Admitting: Obstetrics and Gynecology

## 2023-05-22 ENCOUNTER — Other Ambulatory Visit: Payer: Self-pay

## 2023-05-22 ENCOUNTER — Inpatient Hospital Stay (HOSPITAL_COMMUNITY): Payer: Self-pay | Admitting: Anesthesiology

## 2023-05-22 ENCOUNTER — Encounter (HOSPITAL_COMMUNITY): Payer: Self-pay | Admitting: Obstetrics and Gynecology

## 2023-05-22 ENCOUNTER — Encounter (HOSPITAL_COMMUNITY)
Admission: RE | Disposition: A | Discharge status: Home / Self Care | Payer: Self-pay | Source: Home / Self Care | Attending: Obstetrics and Gynecology

## 2023-05-22 DIAGNOSIS — O99210 Obesity complicating pregnancy, unspecified trimester: Secondary | ICD-10-CM | POA: Diagnosis present

## 2023-05-22 DIAGNOSIS — Z3A39 39 weeks gestation of pregnancy: Secondary | ICD-10-CM

## 2023-05-22 DIAGNOSIS — Z302 Encounter for sterilization: Secondary | ICD-10-CM | POA: Diagnosis not present

## 2023-05-22 DIAGNOSIS — Z98891 History of uterine scar from previous surgery: Principal | ICD-10-CM

## 2023-05-22 DIAGNOSIS — Z8249 Family history of ischemic heart disease and other diseases of the circulatory system: Secondary | ICD-10-CM

## 2023-05-22 DIAGNOSIS — O99214 Obesity complicating childbirth: Secondary | ICD-10-CM | POA: Diagnosis present

## 2023-05-22 DIAGNOSIS — E66813 Obesity, class 3: Secondary | ICD-10-CM | POA: Diagnosis not present

## 2023-05-22 DIAGNOSIS — O9081 Anemia of the puerperium: Secondary | ICD-10-CM | POA: Diagnosis not present

## 2023-05-22 DIAGNOSIS — Z87891 Personal history of nicotine dependence: Secondary | ICD-10-CM | POA: Diagnosis not present

## 2023-05-22 DIAGNOSIS — Z833 Family history of diabetes mellitus: Secondary | ICD-10-CM | POA: Diagnosis not present

## 2023-05-22 DIAGNOSIS — Z3009 Encounter for other general counseling and advice on contraception: Secondary | ICD-10-CM | POA: Diagnosis not present

## 2023-05-22 DIAGNOSIS — D62 Acute posthemorrhagic anemia: Secondary | ICD-10-CM | POA: Diagnosis not present

## 2023-05-22 DIAGNOSIS — Z3480 Encounter for supervision of other normal pregnancy, unspecified trimester: Secondary | ICD-10-CM

## 2023-05-22 DIAGNOSIS — Z3490 Encounter for supervision of normal pregnancy, unspecified, unspecified trimester: Secondary | ICD-10-CM

## 2023-05-22 DIAGNOSIS — O34211 Maternal care for low transverse scar from previous cesarean delivery: Secondary | ICD-10-CM

## 2023-05-22 DIAGNOSIS — O34219 Maternal care for unspecified type scar from previous cesarean delivery: Secondary | ICD-10-CM | POA: Diagnosis not present

## 2023-05-22 LAB — CBC
HCT: 32.9 % — ABNORMAL LOW (ref 36.0–46.0)
Hemoglobin: 11 g/dL — ABNORMAL LOW (ref 12.0–15.0)
MCH: 28.8 pg (ref 26.0–34.0)
MCHC: 33.4 g/dL (ref 30.0–36.0)
MCV: 86.1 fL (ref 80.0–100.0)
Platelets: 248 10*3/uL (ref 150–400)
RBC: 3.82 MIL/uL — ABNORMAL LOW (ref 3.87–5.11)
RDW: 15.7 % — ABNORMAL HIGH (ref 11.5–15.5)
WBC: 13 10*3/uL — ABNORMAL HIGH (ref 4.0–10.5)
nRBC: 0 % (ref 0.0–0.2)

## 2023-05-22 LAB — CREATININE, SERUM
Creatinine, Ser: 0.76 mg/dL (ref 0.44–1.00)
GFR, Estimated: >60 mL/min (ref >60)

## 2023-05-22 SURGERY — Surgical Case
Anesthesia: Spinal | Laterality: Bilateral

## 2023-05-22 MED ORDER — KETOROLAC TROMETHAMINE 30 MG/ML IJ SOLN: 30.0000 mg | Freq: Four times a day (QID) | INTRAMUSCULAR | Status: AC | PRN

## 2023-05-22 MED ORDER — KETOROLAC TROMETHAMINE 30 MG/ML IJ SOLN: 30.0000 mg | Freq: Once | INTRAMUSCULAR | Status: DC | PRN

## 2023-05-22 MED ORDER — LACTATED RINGERS IV SOLN: INTRAVENOUS | Status: DC

## 2023-05-22 MED ORDER — DEXAMETHASONE SODIUM PHOSPHATE 10 MG/ML IJ SOLN
INTRAMUSCULAR | Status: AC
  Filled 2023-05-22: qty 1

## 2023-05-22 MED ORDER — AMISULPRIDE (ANTIEMETIC) 5 MG/2ML IV SOLN: 10.0000 mg | Freq: Once | INTRAVENOUS | Status: DC | PRN

## 2023-05-22 MED ORDER — NALOXONE HCL 0.4 MG/ML IJ SOLN: 0.4000 mg | INTRAMUSCULAR | Status: DC | PRN

## 2023-05-22 MED ORDER — NALOXONE HCL 4 MG/10ML IJ SOLN: 1.0000 ug/kg/h | INTRAVENOUS | Status: DC | PRN

## 2023-05-22 MED ORDER — WITCH HAZEL-GLYCERIN EX PADS: 1.0000 | MEDICATED_PAD | CUTANEOUS | Status: DC | PRN

## 2023-05-22 MED ORDER — DIPHENHYDRAMINE HCL 50 MG/ML IJ SOLN: 12.5000 mg | INTRAMUSCULAR | Status: DC | PRN

## 2023-05-22 MED ORDER — ONDANSETRON HCL 4 MG/2ML IJ SOLN: 4.0000 mg | Freq: Three times a day (TID) | INTRAMUSCULAR | Status: DC | PRN

## 2023-05-22 MED ORDER — OXYCODONE HCL 5 MG/5ML PO SOLN: 5.0000 mg | Freq: Once | ORAL | Status: DC | PRN

## 2023-05-22 MED ORDER — PHENYLEPHRINE HCL-NACL 20-0.9 MG/250ML-% IV SOLN
INTRAVENOUS | Status: DC | PRN
  Administered 2023-05-22: 60 ug/min via INTRAVENOUS

## 2023-05-22 MED ORDER — OXYCODONE HCL 5 MG PO TABS: 5.0000 mg | ORAL_TABLET | Freq: Once | ORAL | Status: DC | PRN

## 2023-05-22 MED ORDER — KETOROLAC TROMETHAMINE 30 MG/ML IJ SOLN
INTRAMUSCULAR | Status: AC
  Filled 2023-05-22: qty 1

## 2023-05-22 MED ORDER — MEPERIDINE HCL 25 MG/ML IJ SOLN: 6.2500 mg | INTRAMUSCULAR | Status: DC | PRN

## 2023-05-22 MED ORDER — SCOPOLAMINE 1 MG/3DAYS TD PT72
MEDICATED_PATCH | TRANSDERMAL | Status: AC
  Filled 2023-05-22: qty 1

## 2023-05-22 MED ORDER — OXYTOCIN-SODIUM CHLORIDE 30-0.9 UT/500ML-% IV SOLN
2.5000 [IU]/h | INTRAVENOUS | Status: AC
  Filled 2023-05-22: qty 500

## 2023-05-22 MED ORDER — KETOROLAC TROMETHAMINE 30 MG/ML IJ SOLN
INTRAMUSCULAR | Status: DC | PRN
  Administered 2023-05-22: 30 mg via INTRAVENOUS

## 2023-05-22 MED ORDER — SCOPOLAMINE 1 MG/3DAYS TD PT72
1.0000 | MEDICATED_PATCH | Freq: Once | TRANSDERMAL | Status: DC
  Administered 2023-05-22: 1.5 mg via TRANSDERMAL

## 2023-05-22 MED ORDER — FENTANYL CITRATE (PF) 100 MCG/2ML IJ SOLN
INTRAMUSCULAR | Status: AC
  Filled 2023-05-22: qty 2

## 2023-05-22 MED ORDER — CEFAZOLIN SODIUM-DEXTROSE 2-4 GM/100ML-% IV SOLN
INTRAVENOUS | Status: AC
  Filled 2023-05-22: qty 100

## 2023-05-22 MED ORDER — COCONUT OIL OIL: 1.0000 | TOPICAL_OIL | Status: DC | PRN

## 2023-05-22 MED ORDER — MENTHOL 3 MG MT LOZG: 1.0000 | LOZENGE | OROMUCOSAL | Status: DC | PRN

## 2023-05-22 MED ORDER — ENOXAPARIN SODIUM 60 MG/0.6ML IJ SOSY
50.0000 mg | PREFILLED_SYRINGE | INTRAMUSCULAR | Status: DC
  Administered 2023-05-23 – 2023-05-24 (×2): 50 mg via SUBCUTANEOUS
  Filled 2023-05-22 (×2): qty 0.6

## 2023-05-22 MED ORDER — PRENATAL MULTIVITAMIN CH
1.0000 | ORAL_TABLET | Freq: Every day | ORAL | Status: DC
  Administered 2023-05-22 – 2023-05-24 (×3): 1 via ORAL
  Filled 2023-05-22 (×3): qty 1

## 2023-05-22 MED ORDER — ONDANSETRON HCL 4 MG/2ML IJ SOLN
INTRAMUSCULAR | Status: AC
  Filled 2023-05-22: qty 2

## 2023-05-22 MED ORDER — ONDANSETRON HCL 4 MG/2ML IJ SOLN
INTRAMUSCULAR | Status: DC | PRN
  Administered 2023-05-22: 4 mg via INTRAVENOUS

## 2023-05-22 MED ORDER — SENNOSIDES-DOCUSATE SODIUM 8.6-50 MG PO TABS
2.0000 | ORAL_TABLET | Freq: Every day | ORAL | Status: DC
  Administered 2023-05-23 – 2023-05-24 (×2): 2 via ORAL
  Filled 2023-05-22 (×2): qty 2

## 2023-05-22 MED ORDER — DIBUCAINE (PERIANAL) 1 % EX OINT: 1.0000 | TOPICAL_OINTMENT | CUTANEOUS | Status: DC | PRN

## 2023-05-22 MED ORDER — OXYCODONE-ACETAMINOPHEN 5-325 MG PO TABS
1.0000 | ORAL_TABLET | ORAL | Status: DC | PRN
  Administered 2023-05-23: 1 via ORAL
  Administered 2023-05-23: 2 via ORAL
  Administered 2023-05-24: 1 via ORAL
  Filled 2023-05-22: qty 1
  Filled 2023-05-22: qty 2
  Filled 2023-05-22: qty 1

## 2023-05-22 MED ORDER — FENTANYL CITRATE (PF) 100 MCG/2ML IJ SOLN
INTRAMUSCULAR | Status: DC | PRN
  Administered 2023-05-22: 15 ug via INTRATHECAL

## 2023-05-22 MED ORDER — SODIUM CHLORIDE 0.9 % IR SOLN
Status: DC | PRN
  Administered 2023-05-22: 1

## 2023-05-22 MED ORDER — CEFAZOLIN SODIUM-DEXTROSE 2-4 GM/100ML-% IV SOLN
2.0000 g | INTRAVENOUS | Status: AC
  Administered 2023-05-22: 2 g via INTRAVENOUS

## 2023-05-22 MED ORDER — ACETAMINOPHEN 500 MG PO TABS
1000.0000 mg | ORAL_TABLET | Freq: Four times a day (QID) | ORAL | Status: AC
  Administered 2023-05-22 – 2023-05-23 (×4): 1000 mg via ORAL
  Filled 2023-05-22 (×3): qty 2

## 2023-05-22 MED ORDER — DIPHENHYDRAMINE HCL 25 MG PO CAPS: 25.0000 mg | ORAL_CAPSULE | Freq: Four times a day (QID) | ORAL | Status: DC | PRN

## 2023-05-22 MED ORDER — SIMETHICONE 80 MG PO CHEW: 80.0000 mg | CHEWABLE_TABLET | ORAL | Status: DC | PRN

## 2023-05-22 MED ORDER — ACETAMINOPHEN 500 MG PO TABS
ORAL_TABLET | ORAL | Status: AC
  Filled 2023-05-22: qty 2

## 2023-05-22 MED ORDER — ONDANSETRON HCL 4 MG/2ML IJ SOLN: 4.0000 mg | Freq: Once | INTRAMUSCULAR | Status: DC | PRN

## 2023-05-22 MED ORDER — SOD CITRATE-CITRIC ACID 500-334 MG/5ML PO SOLN
ORAL | Status: AC
  Filled 2023-05-22: qty 30

## 2023-05-22 MED ORDER — HYDROMORPHONE HCL 1 MG/ML IJ SOLN: 0.2500 mg | INTRAMUSCULAR | Status: DC | PRN

## 2023-05-22 MED ORDER — ARTIFICIAL TEARS OPHTHALMIC OINT
TOPICAL_OINTMENT | OPHTHALMIC | Status: AC
  Filled 2023-05-22: qty 3.5

## 2023-05-22 MED ORDER — OXYTOCIN-SODIUM CHLORIDE 30-0.9 UT/500ML-% IV SOLN
INTRAVENOUS | Status: DC | PRN
Start: 2023-05-22 — End: 2023-05-22
  Administered 2023-05-22: 300 mL via INTRAVENOUS

## 2023-05-22 MED ORDER — DIPHENHYDRAMINE HCL 25 MG PO CAPS: 25.0000 mg | ORAL_CAPSULE | ORAL | Status: DC | PRN

## 2023-05-22 MED ORDER — MORPHINE SULFATE (PF) 0.5 MG/ML IJ SOLN
INTRAMUSCULAR | Status: AC
  Filled 2023-05-22: qty 10

## 2023-05-22 MED ORDER — SIMETHICONE 80 MG PO CHEW
80.0000 mg | CHEWABLE_TABLET | Freq: Three times a day (TID) | ORAL | Status: DC
  Administered 2023-05-22 – 2023-05-24 (×6): 80 mg via ORAL
  Filled 2023-05-22 (×7): qty 1

## 2023-05-22 MED ORDER — MORPHINE SULFATE (PF) 0.5 MG/ML IJ SOLN
INTRAMUSCULAR | Status: DC | PRN
  Administered 2023-05-22: 150 ug via INTRATHECAL

## 2023-05-22 MED ORDER — SOD CITRATE-CITRIC ACID 500-334 MG/5ML PO SOLN
30.0000 mL | ORAL | Status: AC
  Administered 2023-05-22: 30 mL via ORAL

## 2023-05-22 MED ORDER — DEXAMETHASONE SODIUM PHOSPHATE 10 MG/ML IJ SOLN
INTRAMUSCULAR | Status: DC | PRN
  Administered 2023-05-22: 8 mg via INTRAVENOUS

## 2023-05-22 MED ORDER — IBUPROFEN 600 MG PO TABS
600.0000 mg | ORAL_TABLET | Freq: Four times a day (QID) | ORAL | Status: DC
  Administered 2023-05-22 – 2023-05-24 (×8): 600 mg via ORAL
  Filled 2023-05-22 (×9): qty 1

## 2023-05-22 MED ORDER — STERILE WATER FOR IRRIGATION IR SOLN
Status: DC | PRN
  Administered 2023-05-22: 1000 mL

## 2023-05-22 MED ORDER — SODIUM CHLORIDE 0.9% FLUSH: 3.0000 mL | INTRAVENOUS | Status: DC | PRN

## 2023-05-22 SURGICAL SUPPLY — 27 items
CHLORAPREP W/TINT 26 (MISCELLANEOUS) ×2 IMPLANT
CLAMP UMBILICAL CORD (MISCELLANEOUS) ×1 IMPLANT
DRSG OPSITE POSTOP 4X10 (GAUZE/BANDAGES/DRESSINGS) ×1 IMPLANT
ELECT REM PT RETURN 9FT ADLT (ELECTROSURGICAL) ×1 IMPLANT
ELECTRODE REM PT RTRN 9FT ADLT (ELECTROSURGICAL) ×1 IMPLANT
EXTRACTOR VACUUM M CUP 4 TUBE (SUCTIONS) IMPLANT
GAUZE SPONGE 4X4 12PLY STRL LF (GAUZE/BANDAGES/DRESSINGS) IMPLANT
GLOVE BIOGEL PI IND STRL 6.5 (GLOVE) ×1 IMPLANT
GLOVE BIOGEL PI IND STRL 7.0 (GLOVE) ×1 IMPLANT
GLOVE SURG SS PI 6.0 STRL IVOR (GLOVE) ×1 IMPLANT
GOWN STRL REUS W/TWL LRG LVL3 (GOWN DISPOSABLE) ×2 IMPLANT
KIT ABG SYR 3ML LUER SLIP (SYRINGE) IMPLANT
NDL HYPO 25X5/8 SAFETYGLIDE (NEEDLE) IMPLANT
NEEDLE HYPO 25X5/8 SAFETYGLIDE (NEEDLE) IMPLANT
NS IRRIG 1000ML POUR BTL (IV SOLUTION) ×1 IMPLANT
PACK C SECTION WH (CUSTOM PROCEDURE TRAY) ×1 IMPLANT
PAD ABD DERMACEA PRESS 5X9 (GAUZE/BANDAGES/DRESSINGS) IMPLANT
PAD OB MATERNITY 4.3X12.25 (PERSONAL CARE ITEMS) ×1 IMPLANT
RTRCTR C-SECT PINK 25CM LRG (MISCELLANEOUS) IMPLANT
SEPRAFILM MEMBRANE 5X6 (MISCELLANEOUS) IMPLANT
SUT PLAIN 0 NONE (SUTURE) ×1 IMPLANT
SUT VIC AB 0 CT1 36 (SUTURE) ×4 IMPLANT
SUT VIC AB 4-0 KS 27 (SUTURE) ×1 IMPLANT
SUTURE PLAIN GUT 2.0 ETHICON (SUTURE) IMPLANT
TOWEL OR 17X24 6PK STRL BLUE (TOWEL DISPOSABLE) ×1 IMPLANT
TRAY FOLEY W/BAG SLVR 14FR LF (SET/KITS/TRAYS/PACK) ×1 IMPLANT
WATER STERILE IRR 1000ML POUR (IV SOLUTION) ×1 IMPLANT

## 2023-05-22 NOTE — H&P (Signed)
 Kristina Alvarado is a 33 y.o. female G3P1011 at [redacted]w[redacted]d presenting for scheduled elective repeat cesarean section. Patient with prenatal care at [facility] since 12 weeks complicated by previous cesarean section due to NRFHT and maternal obesity. Patient reports feeling well and reports good fetal movement. She denies vaginal bleeding, leakage of fluid or regular contractions.  OB History     Gravida  3   Para  1   Term  1   Preterm      AB  1   Living  1      SAB      IAB      Ectopic  1   Multiple  0   Live Births  1          Past Medical History:  Diagnosis Date   Anemia    Dry skin 08/01/2021   History of ectopic pregnancy 2019   Vitamin D deficiency 08/01/2021   Past Surgical History:  Procedure Laterality Date   CESAREAN SECTION N/A 02/17/2019   Procedure: CESAREAN SECTION;  Surgeon: Catalina Antigua, MD;  Location: [facility] LD ORS;  Service: Obstetrics;  Laterality: N/A;   DIAGNOSTIC LAPAROSCOPY WITH REMOVAL OF ECTOPIC PREGNANCY N/A 09/10/2017   Procedure: DIAGNOSTIC LAPAROSCOPY WITH REMOVAL OF ECTOPIC PREGNANCY RIGHT SALPENGECTOMY RIGHT;  Surgeon: Tereso Newcomer, MD;  Location: [facility] ORS;  Service: Gynecology;  Laterality: N/A;   Family History: family history includes Asthma in her father, mother, and sister; Diabetes in her mother; Heart disease in her maternal aunt, maternal grandmother, and mother; Hypertension in her mother; Kidney disease in her mother. Social History:  reports that she quit smoking about 8 months ago. Her smoking use included cigars. She has never used smokeless tobacco. She reports that she does not currently use alcohol. She reports that she does not currently use drugs after having used the following drugs: Marijuana.     Maternal Diabetes: No Genetic Screening: Normal Maternal Ultrasounds/Referrals: Normal Fetal Ultrasounds or other Referrals:  None Maternal Substance Abuse:  No Significant Maternal Medications:  None Significant  Maternal Lab Results:  None and Group B Strep negative Number of Prenatal Visits:greater than 3 verified prenatal visits Maternal Vaccinations: Other Comments:  None  Review of Systems See pertinent in HPI. All other systems reviewed and non contributory   Blood pressure (!) 142/91, pulse 83, temperature 98.7 F (37.1 C), temperature source Oral, resp. rate 16, height 4' 11.5" (1.511 m), weight 103.1 kg, last menstrual period 08/13/2022, SpO2 98%. Exam Physical Exam  GENERAL: Well-developed, well-nourished female in no acute distress.  LUNGS: Clear to auscultation bilaterally.  HEART: Regular rate and rhythm. ABDOMEN: Soft, nontender, nondistended. Graivd PELVIC: Not indicated EXTREMITIES: No cyanosis, clubbing, or edema, 2+ distal pulses.  Prenatal labs: ABO, Rh: --/--/O POS (04/09 0945) Antibody: NEG (04/09 0945) Rubella: 1.85 (10/03 1507) RPR: NON REACTIVE (04/09 0946)  HBsAg: Negative (10/03 1507)  HIV: NON REACTIVE (04/09 0946)  GBS: Negative/-- (03/24 1130)   Assessment/Plan: 33 yo G3P1011 at [redacted]w[redacted]d here for scheduled repeat cesarean section and BTL - Risks, benefits and alternatives were explained including but not limited to risks of bleeding, infection and damage to adjacent organs. Patient verbalized understanding and all questions were answered - Patient also desires permanent sterilization. Risks and benefits of procedure discussed with patient including permanence of method, bleeding, infection, injury to surrounding organs and need for additional procedures. Risk failure of 0.5-1% with increased risk of ectopic gestation if pregnancy occurs was also discussed with patient. Patient wishes to  proceed. Patient with previous salpingectomy for the treatment of an ectopic pregnancy      Shiheem Corporan 05/22/2023, 10:57 AM

## 2023-05-22 NOTE — Anesthesia Procedure Notes (Signed)
 Spinal  Patient location during procedure: OR Start time: 05/22/2023 11:10 AM End time: 05/22/2023 11:13 AM Reason for block: surgical anesthesia Staffing Performed: anesthesiologist  Anesthesiologist: Lannie Fields, DO Performed by: Lannie Fields, DO Authorized by: Lannie Fields, DO   Preanesthetic Checklist Completed: patient identified, IV checked, risks and benefits discussed, surgical consent, monitors and equipment checked, pre-op evaluation and timeout performed Spinal Block Patient position: sitting Prep: DuraPrep and site prepped and draped Patient monitoring: cardiac monitor, continuous pulse ox and blood pressure Approach: midline Location: L3-4 Injection technique: single-shot Needle Needle type: Pencan  Needle gauge: 24 G Needle length: 9 cm Assessment Sensory level: T6 Events: CSF return Additional Notes Functioning IV was confirmed and monitors were applied. Sterile prep and drape, including hand hygiene and sterile gloves were used. The patient was positioned and the spine was prepped. The skin was anesthetized with lidocaine.  Free flow of clear CSF was obtained prior to injecting local anesthetic into the CSF.  The spinal needle aspirated freely following injection.  The needle was carefully withdrawn.  The patient tolerated the procedure well.

## 2023-05-22 NOTE — Anesthesia Postprocedure Evaluation (Signed)
 Anesthesia Post Note  Patient: Kristina Alvarado  Procedure(s) Performed: CESAREAN SECTION, WITH BILATERAL TUBAL LIGATION (Bilateral)     Patient location during evaluation: PACU Anesthesia Type: Spinal Level of consciousness: awake and alert and oriented Pain management: pain level controlled Vital Signs Assessment: post-procedure vital signs reviewed and stable Respiratory status: spontaneous breathing, nonlabored ventilation and respiratory function stable Cardiovascular status: blood pressure returned to baseline and stable Postop Assessment: no headache, no backache and spinal receding Anesthetic complications: no   No notable events documented.  Last Vitals:  Vitals:   05/22/23 1300 05/22/23 1315  BP: (!) 121/99 (!) 129/97  Pulse: 92   Resp: (!) 21 18  Temp:    SpO2: 97%     Last Pain:  Vitals:   05/22/23 1315  TempSrc:   PainSc: 0-No pain    LLE Motor Response: Purposeful movement (05/22/23 1315) LLE Sensation: Tingling (05/22/23 1315) RLE Motor Response: Purposeful movement (05/22/23 1315) RLE Sensation: Tingling (05/22/23 1315)      Lannie Fields

## 2023-05-22 NOTE — Discharge Summary (Signed)
 Postpartum Discharge Summary  Date of Service updated     Patient Name: Kristina Alvarado DOB: Aug 22, 1990 MRN: [id number]  Date of admission: 05/22/2023 Delivery date:05/22/2023 Delivering provider: CONSTANT, PEGGY Date of discharge: 05/24/2023  Admitting diagnosis: S/P repeat low transverse C-section [Z98.891] Pregnancy [Z34.90] Intrauterine pregnancy: [redacted]w[redacted]d     Secondary diagnosis:  Principal Problem:   S/P repeat low transverse C-section Active Problems:   Supervision of other normal pregnancy, antepartum   Obesity affecting pregnancy, antepartum   Pregnancy  Additional problems: Desire for permanent sterilization    Discharge diagnosis: Term Pregnancy Delivered                                              Post partum procedures: None Complications: None  Hospital course: Sceduled C/S   33 y.o. yo [id number] at [redacted]w[redacted]d was admitted to the hospital 05/22/2023 for scheduled cesarean section with the following indication:Elective Repeat. Delivery details are as follows:  Membrane Rupture Time/Date: 11:37 AM,05/22/2023  Delivery Method:C-Section, Low Transverse Operative Delivery:N/A Details of operation can be found in separate operative note.  Patient had a postpartum course complicated by none.  She is ambulating, tolerating a regular diet, passing flatus, and urinating well. Patient is discharged home in stable condition on  05/24/23        Newborn Data: Birth date:05/22/2023 Birth time:11:38 AM Gender:Female Living status:Living Apgars:9 ,9  Weight:2840 g    Magnesium Sulfate received: No BMZ received: No Rhophylac:No MMR:No T-DaP:Given prenatally Flu: Yes RSV Vaccine received: No Transfusion:No  Immunizations received: Immunization History  Administered Date(s) Administered   Influenza,inj,Quad PF,6+ Mos 12/21/2018   Tdap 12/21/2018    Physical exam  Vitals:   05/23/23 1451 05/23/23 1608 05/23/23 1941 05/24/23 0612  BP: (!) 124/91 127/74 134/83 130/83   Pulse: 72 67 76 82  Resp: 18  18 18   Temp: 98.4 F (36.9 C)  98.1 F (36.7 C) 97.6 F (36.4 C)  TempSrc: Oral  Oral Oral  SpO2:   100% 100%  Weight:      Height:       General: alert, cooperative, and no distress Lochia: appropriate Uterine Fundus: firm Incision: Dressing is clean, dry, and intact DVT Evaluation: No evidence of DVT seen on physical exam. Labs: Lab Results  Component Value Date   WBC 11.5 (H) 05/23/2023   HGB 9.5 (L) 05/23/2023   HCT 28.3 (L) 05/23/2023   MCV 85.8 05/23/2023   PLT 234 05/23/2023      Latest Ref Rng & Units 05/22/2023    2:25 PM  CMP  Creatinine 0.44 - 1.00 mg/dL 1.30    Edinburgh Score:    05/22/2023    1:46 PM  Edinburgh Postnatal Depression Scale Screening Tool  I have been able to laugh and see the funny side of things. --   No data recorded  After visit meds:  Allergies as of 05/24/2023   No Known Allergies      Medication List     STOP taking these medications    aspirin EC 81 MG tablet   butalbital-acetaminophen-caffeine 50-325-40 MG tablet Commonly known as: FIORICET   docusate sodium 100 MG capsule Commonly known as: Colace   Doxylamine-Pyridoxine 10-10 MG Tbec Commonly known as: Diclegis   metroNIDAZOLE 500 MG tablet Commonly known as: FLAGYL   ondansetron 4 MG disintegrating tablet Commonly known as: ZOFRAN-ODT  promethazine 25 MG tablet Commonly known as: PHENERGAN       TAKE these medications    acetaminophen 500 MG tablet Commonly known as: TYLENOL Take 2 tablets (1,000 mg total) by mouth every 8 (eight) hours as needed for moderate pain (pain score 4-6).   ferrous sulfate 325 (65 FE) MG tablet Take 1 tablet (325 mg total) by mouth every other day.   ibuprofen 600 MG tablet Commonly known as: ADVIL Take 1 tablet (600 mg total) by mouth every 4 (four) hours as needed for moderate pain (pain score 4-6).   oxyCODONE 5 MG immediate release tablet Commonly known as: Roxicodone Take 1  tablet (5 mg total) by mouth every 6 (six) hours as needed for severe pain (pain score 7-10).   PrePLUS 27-1 MG Tabs Take 1 tablet by mouth daily.   senna-docusate 8.6-50 MG tablet Commonly known as: Senokot-S Take 2 tablets by mouth at bedtime as needed for mild constipation.         Discharge home in stable condition Infant Feeding: Bottle and Breast Infant Disposition:home with mother Discharge instruction: per After Visit Summary and Postpartum booklet. Activity: Advance as tolerated. Pelvic rest for 6 weeks.  Diet: routine diet Future Appointments: Future Appointments  Date Time Provider Department Center  06/01/2023  1:40 PM [facility] NURSE [facility] None  06/22/2023  3:10 PM Constant, Carles Cheadle, MD [facility] None   Follow up Visit:   Please schedule this patient for a In person postpartum visit in 4 weeks with the following provider: Any provider. Additional Postpartum F/U:Incision check 1 week  Low risk pregnancy complicated Delivery mode:  C-Section, Low Transverse Anticipated Birth Control:  BTL done at the time of cesarean section   05/24/2023 Maud Sorenson, MD

## 2023-05-22 NOTE — Lactation Note (Signed)
 This note was copied from a baby's chart. Lactation Consultation Note  Patient Name: Kristina Alvarado [id number] Date: 05/22/2023 Age:31 hours Reason for consult: Initial assessment;Term  P2- MOB plans to offer both breast and formula. MOB reports that feedings have been going well so far. Per MOB, infant has only latched once since birth but he didn't do much do to being tired. LC reviewed the first 24 hr birthday nap and how this is normal behavior. LC encouraged MOB to always off the breast first, then formula as needed. LC encouraged breast stimulation through the DEBP or hand expression when infant does not latch. MOB reports that she used the hospital DEBP with her last child and it hurt. MOB requested a manual pump instead. LC provided MOB with the manual pump and a 21 mm flange. LC reviewed how to use it. MOB denies having further questions or concerns at this time. Infant was drinking formula, so LC was unable to assess a latch at this time. MOB plans to call for infant's next feeding.   LC reviewed CDC milk storage guidelines, LC services handout, engorgement/breast care, feeding infant on cue 8-12x in 24 hrs, not allowing infant to go over 3 hrs without a feeding and when to expect her mature milk to come in. [facility] encouraged MOB to call for further assistance as needed. [facility] referral sent out today.  Maternal Data Has patient been taught Hand Expression?: No Does the patient have breastfeeding experience prior to this delivery?: Yes How long did the patient breastfeed?: 1 day, milk did not come in until 2 weeks pp and infant wouldn't latch by then due to bottle feeding only  Feeding Mother's Current Feeding Choice: Breast Milk and Formula Nipple Type: Slow - flow  LATCH Score Latch: Repeated attempts needed to sustain latch, nipple held in mouth throughout feeding, stimulation needed to elicit sucking reflex.  Audible Swallowing: None  Type of Nipple: Everted at rest and after  stimulation  Comfort (Breast/Nipple): Soft / non-tender  Hold (Positioning): Assistance needed to correctly position infant at breast and maintain latch.  LATCH Score: 6   Lactation Tools Discussed/Used Tools: Pump;Flanges Flange Size: 21 Breast pump type: Manual Pump Education: Setup, frequency, and cleaning;Milk Storage Reason for Pumping: MOB request Pumping frequency: 15-20 min every 3 hrs  Interventions Interventions: Breast feeding basics reviewed;Hand pump;Education;Pace feeding;LC Services brochure  Discharge Discharge Education: Engorgement and breast care;Warning signs for feeding baby Pump: Referral sent for [facility] Pump  Consult Status Consult Status: Follow-up Date: 05/23/23 Follow-up type: In-patient    Dema Severin BS, IBCLC 05/22/2023, 3:59 PM

## 2023-05-22 NOTE — Op Note (Signed)
 Kristina Alvarado PROCEDURE DATE: 05/22/2023  PREOPERATIVE DIAGNOSIS: Intrauterine pregnancy at  [redacted]w[redacted]d weeks gestation; patient declines vag del attempt and desires permanent sterilization  POSTOPERATIVE DIAGNOSIS: The same  PROCEDURE:     Cesarean Section and left salpingectomy  SURGEON:  Dr. Catalina Antigua  ASSISTANT: None  INDICATIONS: Kristina Alvarado is a 33 y.o. G3P1011 at [redacted]w[redacted]d scheduled for cesarean section secondary to patient declines vag del attempt.  The risks of cesarean section discussed with the patient included but were not limited to: bleeding which may require transfusion or reoperation; infection which may require antibiotics; injury to bowel, bladder, ureters or other surrounding organs; injury to the fetus; need for additional procedures including hysterectomy in the event of a life-threatening hemorrhage; placental abnormalities wth subsequent pregnancies, incisional problems, thromboembolic phenomenon and other postoperative/anesthesia complications. Patient also desires permanent sterilization. Risks and benefits of salpingectomy discussed with patient including permanence of method, bleeding, infection, injury to surrounding organs and need for additional procedures. Risk failure of 0.5-1% with increased risk of ectopic gestation if pregnancy occurs was also discussed with patient. The patient concurred with the proposed plan, giving informed written consent for the procedure.    FINDINGS:  Viable female infant in cephalic presentation.  Apgars 9 and 9, weight, 6 pounds and 4 ounces.  Clear amniotic fluid.  Intact placenta, three vessel cord.  Normal uterus, left fallopian tube and ovaries bilaterally. Right fallopian tube previously surgically removed. Minimal adhesions noted  ANESTHESIA:    Spinal INTRAVENOUS FLUIDS:1500 ml ESTIMATED BLOOD LOSS: 160 ml URINE OUTPUT:  100 ml SPECIMENS: Placenta sent to L&D and left fallopian tube sent to pathology COMPLICATIONS: None  immediate  PROCEDURE IN DETAIL:  The patient received intravenous antibiotics and had sequential compression devices applied to her lower extremities while in the preoperative area.  She was then taken to the operating room where anesthesia was induced and was found to be adequate. A foley catheter was placed into her bladder and attached to Snigdha Howser gravity. She was then placed in a dorsal supine position with a leftward tilt, and prepped and draped in a sterile manner. After an adequate timeout was performed, a Pfannenstiel skin incision was made with scalpel and carried through to the underlying layer of fascia. The fascia was incised in the midline and this incision was extended bilaterally using the Mayo scissors. Kocher clamps were applied to the superior aspect of the fascial incision and the underlying rectus muscles were dissected off bluntly. A similar process was carried out on the inferior aspect of the facial incision. The rectus muscles were separated in the midline bluntly and the peritoneum was entered bluntly. The Alexis self-retaining retractor was introduced into the abdominal cavity. Attention was turned to the lower uterine segment where a bladder flap was created, and a transverse hysterotomy was made with a scalpel and extended bilaterally bluntly. The infant was successfully delivered, and delayed cord clamping was performed for 1 minute. The cord was clamped and cut and infant was handed over to awaiting neonatology team. Uterine massage was then administered and the placenta delivered intact with three-vessel cord. The uterus was cleared of clot and debris.  The hysterotomy was closed with 0 Vicryl in a running locked fashion, and an imbricating layer was also placed with a 0 Vicryl. Overall, excellent hemostasis was noted. The pelvis was  cleared of all clot and debris. The patient's left fallopian tube was then identified, brought to the incision, and grasped with a Babcock clamp. The  tube was then  followed out to the fimbria. The tube was doubly clamped with a Kelly clamp, transected and suture ligated x 2. Hemostasis was confirmed on all surfaces.  The peritoneum and the muscles were reapproximated using 0 vicryl interrupted stitches. The fascia was then closed using 0 Vicryl in a running fashion.  The subcutaneous layer was reapproximated with plain gut and the skin was closed in a subcuticular fashion using 3.0 Vicryl. The patient tolerated the procedure well. Sponge, lap, instrument and needle counts were correct x 2. She was taken to the recovery room in stable condition.    Rolly Magri ConstantMD  05/22/2023 12:15 PM

## 2023-05-22 NOTE — Transfer of Care (Signed)
 Immediate Anesthesia Transfer of Care Note  Patient: Kristina Alvarado  Procedure(s) Performed: CESAREAN SECTION, WITH BILATERAL TUBAL LIGATION (Bilateral)  Patient Location: PACU  Anesthesia Type:Spinal  Level of Consciousness: awake, alert , and oriented  Airway & Oxygen Therapy: Patient Spontanous Breathing  Post-op Assessment: Report given to RN and Post -op Vital signs reviewed and stable  Post vital signs: Reviewed and stable  Last Vitals:  Vitals Value Taken Time  BP 119/76 05/22/23 1230  Temp    Pulse 80 05/22/23 1231  Resp 19 05/22/23 1231  SpO2 96 % 05/22/23 1231  Vitals shown include unfiled device data.  Last Pain:  Vitals:   05/22/23 1033  TempSrc:   PainSc: 0-No pain         Complications: No notable events documented.

## 2023-05-23 LAB — CBC
HCT: 28.3 % — ABNORMAL LOW (ref 36.0–46.0)
Hemoglobin: 9.5 g/dL — ABNORMAL LOW (ref 12.0–15.0)
MCH: 28.8 pg (ref 26.0–34.0)
MCHC: 33.6 g/dL (ref 30.0–36.0)
MCV: 85.8 fL (ref 80.0–100.0)
Platelets: 234 10*3/uL (ref 150–400)
RBC: 3.3 MIL/uL — ABNORMAL LOW (ref 3.87–5.11)
RDW: 15.6 % — ABNORMAL HIGH (ref 11.5–15.5)
WBC: 11.5 10*3/uL — ABNORMAL HIGH (ref 4.0–10.5)
nRBC: 0 % (ref 0.0–0.2)

## 2023-05-23 NOTE — Lactation Note (Signed)
 This note was copied from a baby's chart. Lactation Consultation Note  Patient Name: Boy Fia Hebert [id number] Date: 05/23/2023 Age:31 hours  LC entered the room, MOB and infant asleep at this time.   Maternal Data    Feeding Nipple Type: Slow - flow  LATCH Score                    Lactation Tools Discussed/Used    Interventions    Discharge    Consult Status      Pecolia Bourbon 05/23/2023, 9:43 PM

## 2023-05-23 NOTE — Progress Notes (Signed)
 MOB educated on UDS screen for infant. MOB opt not to, stated THC use was prior to pregnancy, not during.

## 2023-05-23 NOTE — Progress Notes (Signed)
 POSTPARTUM PROGRESS NOTE  POD #1  Subjective:  Kristina Alvarado is a 33 y.o. [id number] s/p rLTCS at [redacted]w[redacted]d. No acute events overnight. She reports she is doing well. She denies any problems with ambulating, voiding or po intake. Denies nausea or vomiting. She has passed flatus. Pain is well controlled.  Lochia is appropriate.  Objective: Blood pressure 127/74, pulse 67, temperature 98.4 F (36.9 C), temperature source Oral, resp. rate 18, height 4' 11.5" (1.511 m), weight 103.1 kg, last menstrual period 08/13/2022, SpO2 99%, unknown if currently breastfeeding.  Physical Exam:  General: alert, cooperative and no distress Chest: no respiratory distress Heart: regular rate, distal pulses intact Uterine Fundus: firm, appropriately tender DVT Evaluation: No calf swelling or tenderness Extremities: trace edema Skin: warm, dry; incision clean/dry/intact w/honeycomb dressing in place  Recent Labs    05/22/23 1425 05/23/23 0434  HGB 11.0* 9.5*  HCT 32.9* 28.3*    Assessment/Plan: Baelyn Doring is a 33 y.o. [id number] s/p rLTCS and salpingectomy at [redacted]w[redacted]d for scheduled procedure.  POD#1 - Doing welll; pain well controlled. H/H appropriate  Routine postpartum care  OOB, ambulated  Lovenox for VTE prophylaxis Acute blood loss Anemia: asymptomatic, clinically significant  Start po ferrous sulfate every other day  Circumcision consent completed; see infant chart for details.  Contraception: s/p salpingectomy Feeding: bottle  Dispo: Plan for discharge 4/13.   LOS: 1 day   Teletha Petrea, MD OB Fellow  05/23/2023, 4:31 PM

## 2023-05-23 NOTE — Clinical Social Work Maternal (Signed)
 CLINICAL SOCIAL WORK MATERNAL/CHILD NOTE  Patient Details  Name: Kristina Alvarado MRN: [id number] Date of Birth: 1990-05-13  Date:  05/23/2023  Clinical Social Worker Initiating Note:  Zygmunt Hives Date/Time: Initiated:  05/23/23/1350     Child's Name:  Antoinette Batman   Biological Parents:  Mother, Father   Need for Interpreter:  None   Reason for Referral:  Current Substance Use/Substance Use During Pregnancy     Address:  [address] [place] [address]    Phone number:  [phone] (home)     Additional phone number:   Household Members/Support Persons (HM/SP):   Household Member/Support Person 1, Household Member/Support Person 2   HM/SP Name Relationship DOB or Age  HM/SP -1 Kristina Cowman Sr. FOB 04/17/1990  HM/SP -2 Kristina Alvarado 02/17/2019 daughter  HM/SP -3        HM/SP -4        HM/SP -5        HM/SP -6        HM/SP -7        HM/SP -8          Natural Supports (not living in the home):  Extended Family, Immediate Family, Parent   Professional Supports: None   Employment: Unemployed   Type of Work:     Education:  Engineer, agricultural   Homebound arranged:    Surveyor, quantity Resources:  Medicaid   Other Resources:  Sales executive  , WIC   Cultural/Religious Considerations Which May Impact Care:  none reported  Strengths:  Ability to meet basic needs  , Merchandiser, retail, Home prepared for child     Psychotropic Medications:         Pediatrician:    Armed forces operational officer area  Pediatrician List:   [facility]  [place]    [place] [facility]      Pediatrician Fax Number:    Risk Factors/Current Problems:  Substance Use     Cognitive State:  Alert  , Insightful  , Able to Concentrate  , Goal Oriented     Mood/Affect:  Comfortable  , Interested  , Happy  , Bright  , Calm  , Relaxed     CSW Assessment: CSW met with MOB at  bedside in room 418  to complete assessment for consult regarding hx of THC use. Upon this writers arrival, MOB was resting and bed, FOB was resting on the couch, and infant was asleep in his bassinet. This Clinical research associate explained role and reasoning for visit. MOB gave this Clinical research associate permission to have FOB to leave the room in order for this writer to assess MOB in private.  MOB was warm and welcoming. This Clinical research associate inquired about substance use hx. MOB noted she used [facility] recreationally prior to confirming pregnancy and quit early on. This Clinical research associate educated MOB on the hospitals policy and procedure regarding substance use and mandated reporting for positive screens. MOB verbalized understanding. This Clinical research associate informed MOB that if UDS or CDS come back positive she will be notified and a report will be made to [facility]. MOB verbalized understanding and noted no further questions as she is confident noting will appear since she quit so many months ago. This Clinical research associate assessed for other psychosocial stressors. MOB notes she has no further needs and has everything she needs for baby's arrival home to include car seat, crib, and  clothing items. At this time, CSW has no barriers to d/c.  MOB denied having any CPS hx.  CSW Plan/Description:  Psychosocial Support and Ongoing Assessment of Needs, Sudden Infant Death Syndrome (SIDS) Education, Perinatal Mood and Anxiety Disorder (PMADs) Education, Hospital Drug Screen Policy Information, CSW Will Continue to Monitor Umbilical Cord Tissue Drug Screen Results and Make Report if Warranted    Zygmunt Hives, MSW, LCSW Clinical Social Work [phone]  Selinda Dales, LCSW 05/23/2023, 1:53 PM

## 2023-05-24 MED ORDER — SENNOSIDES-DOCUSATE SODIUM 8.6-50 MG PO TABS: 2.0000 | ORAL_TABLET | Freq: Every evening | ORAL | 0 refills | Status: DC | PRN

## 2023-05-24 MED ORDER — IBUPROFEN 600 MG PO TABS: 600.0000 mg | ORAL_TABLET | ORAL | 0 refills | Status: AC | PRN

## 2023-05-24 MED ORDER — OXYCODONE HCL 5 MG PO TABS: 5.0000 mg | ORAL_TABLET | Freq: Four times a day (QID) | ORAL | 0 refills | Status: DC | PRN

## 2023-05-24 MED ORDER — FERROUS GLUCONATE 324 (38 FE) MG PO TABS
324.0000 mg | ORAL_TABLET | ORAL | Status: DC
  Administered 2023-05-24: 324 mg via ORAL
  Filled 2023-05-24: qty 1

## 2023-05-24 MED ORDER — ACETAMINOPHEN 500 MG PO TABS: 1000.0000 mg | ORAL_TABLET | Freq: Three times a day (TID) | ORAL | 0 refills | Status: AC | PRN

## 2023-05-24 NOTE — Lactation Note (Signed)
 This note was copied from a baby's chart. Lactation Consultation Note  Patient Name: Kristina Alvarado [id number] Date: 05/24/2023 Age:31 hours Reason for consult: Follow-up assessment;Term  P2, When LC entered room, baby was lying flat with bottle propped in his mouth with no assistance.  Stopped feeding and provided education.  RN aware to reinforce education. Mother inquired about her milk supply.  She has been hand expressing drops and has pumped some.  Discussed how breastmilk comes to volume. Mother states she plans on pumping once home. Reviewed engorgement care and monitoring voids/stools. Feed on demand with cues.  Goal 8-12+ times per day after first 24 hrs.  Place baby STS if not cueing.    Maternal Data Has patient been taught Hand Expression?: Yes  Feeding Mother's Current Feeding Choice: Breast Milk and Formula Nipple Type: Slow - flow  Lactation Tools Discussed/Used Breast pump type: Manual Pumping frequency: PRN  Interventions Interventions: Education  Discharge Discharge Education: Engorgement and breast care;Warning signs for feeding baby  Consult Status Consult Status: Complete Date: 05/24/23 Kristina Sages  RN, IBCLC 05/24/2023, 12:05 PM

## 2023-05-25 LAB — SURGICAL PATHOLOGY

## 2023-05-26 ENCOUNTER — Encounter: Payer: Self-pay | Admitting: Obstetrics & Gynecology

## 2023-05-29 ENCOUNTER — Inpatient Hospital Stay (HOSPITAL_COMMUNITY)
Admission: AD | Admit: 2023-05-29 | Discharge: 2023-05-30 | Disposition: A | Discharge status: Home / Self Care | Attending: Obstetrics and Gynecology | Admitting: Obstetrics and Gynecology

## 2023-05-29 DIAGNOSIS — T81321A Disruption or dehiscence of closure of internal operation (surgical) wound of abdominal wall muscle or fascia, initial encounter: Secondary | ICD-10-CM

## 2023-05-29 DIAGNOSIS — O902 Hematoma of obstetric wound: Secondary | ICD-10-CM | POA: Insufficient documentation

## 2023-05-29 NOTE — MAU Note (Signed)
..  Kristina Alvarado is a 33 y.o. at Unknown here in MAU reporting: was getting ready for bed and felt bleeding from incision. Has soaked through gauze. Denies pain or any strenuous activity prior to bleeding.  Denies headache, visual changes, epigastric pain.   Pain score: 0/10 Vitals:   05/29/23 2330  BP: 139/86  Pulse: 75  Resp: 18  Temp: 98.2 F (36.8 C)  SpO2: 100%

## 2023-05-30 DIAGNOSIS — T81321A Disruption or dehiscence of closure of internal operation (surgical) wound of abdominal wall muscle or fascia, initial encounter: Secondary | ICD-10-CM

## 2023-05-30 DIAGNOSIS — O902 Hematoma of obstetric wound: Secondary | ICD-10-CM | POA: Diagnosis not present

## 2023-05-30 LAB — COMPREHENSIVE METABOLIC PANEL WITH GFR
ALT: 23 U/L (ref 0–44)
AST: 18 U/L (ref 15–41)
Albumin: 3.1 g/dL — ABNORMAL LOW (ref 3.5–5.0)
Alkaline Phosphatase: 73 U/L (ref 38–126)
Anion gap: 11 (ref 5–15)
BUN: 16 mg/dL (ref 6–20)
CO2: 23 mmol/L (ref 22–32)
Calcium: 9.3 mg/dL (ref 8.9–10.3)
Chloride: 105 mmol/L (ref 98–111)
Creatinine, Ser: 1.18 mg/dL — ABNORMAL HIGH (ref 0.44–1.00)
GFR, Estimated: >60 mL/min (ref >60)
Glucose, Bld: 86 mg/dL (ref 70–99)
Potassium: 4.6 mmol/L (ref 3.5–5.1)
Sodium: 139 mmol/L (ref 135–145)
Total Bilirubin: 0.4 mg/dL (ref 0.0–1.2)
Total Protein: 7.1 g/dL (ref 6.5–8.1)

## 2023-05-30 LAB — CBC
HCT: 32.1 % — ABNORMAL LOW (ref 36.0–46.0)
Hemoglobin: 10.4 g/dL — ABNORMAL LOW (ref 12.0–15.0)
MCH: 28.3 pg (ref 26.0–34.0)
MCHC: 32.4 g/dL (ref 30.0–36.0)
MCV: 87.2 fL (ref 80.0–100.0)
Platelets: 314 10*3/uL (ref 150–400)
RBC: 3.68 MIL/uL — ABNORMAL LOW (ref 3.87–5.11)
RDW: 15.3 % (ref 11.5–15.5)
WBC: 7.6 10*3/uL (ref 4.0–10.5)
nRBC: 0 % (ref 0.0–0.2)

## 2023-05-30 NOTE — MAU Provider Note (Signed)
 Chief Complaint: Bleeding from incision   Event Date/Time   First Provider Initiated Contact with Patient 05/30/23 0014      SUBJECTIVE HPI: Kristina Alvarado is a 33 y.o. [id number] on postpartum/POD #8 following RLTCS who presents to maternity admissions reporting bleeding from right side of incision site.  She denies any pain, and reports no bleeding until tonight, when bright red bleeding started from the right side of the incision, soaking through onto her clothes.   HPI  Past Medical History:  Diagnosis Date   Anemia    Dry skin 08/01/2021   History of ectopic pregnancy 2019   Vitamin D  deficiency 08/01/2021   Past Surgical History:  Procedure Laterality Date   CESAREAN SECTION N/A 02/17/2019   Procedure: CESAREAN SECTION;  Surgeon: Verlyn Goad, MD;  Location: [facility];  Service: Obstetrics;  Laterality: N/A;   CESAREAN SECTION WITH BILATERAL TUBAL LIGATION Bilateral 05/22/2023   Procedure: CESAREAN SECTION, WITH BILATERAL TUBAL LIGATION;  Surgeon: Verlyn Goad, MD;  Location: [facility];  Service: Obstetrics;  Laterality: Bilateral;   DIAGNOSTIC LAPAROSCOPY WITH REMOVAL OF ECTOPIC PREGNANCY N/A 09/10/2017   Procedure: DIAGNOSTIC LAPAROSCOPY WITH REMOVAL OF ECTOPIC PREGNANCY RIGHT SALPENGECTOMY RIGHT;  Surgeon: Julianne Octave, MD;  Location: [facility];  Service: Gynecology;  Laterality: N/A;   Social History   Socioeconomic History   Marital status: Single    Spouse name: Not on file   Number of children: 1   Years of education: Not on file   Highest education level: Not on file  Occupational History    Comment: [employer]  Tobacco Use   Smoking status: Former    Types: Cigars    Quit date: 09/11/2022    Years since quitting: 0.7   Smokeless tobacco: Never   Tobacco comments:    Recently restarted due to stress 09/28/20  Vaping Use   Vaping status: Never Used  Substance and Sexual Activity   Alcohol use: Not Currently    Comment: a bottle of wine a day   Drug  use: Not Currently    Types: Marijuana   Sexual activity: Not Currently    Partners: Male  Other Topics Concern   Not on file  Social History Narrative   Not on file   Social Drivers of Health   Financial Resource Strain: Not on file  Food Insecurity: No Food Insecurity (09/28/2020)   Hunger Vital Sign    Worried About Running Out of Food in the Last Year: Never true    Ran Out of Food in the Last Year: Never true  Transportation Needs: No Transportation Needs (09/28/2020)   PRAPARE - Administrator, Civil Service (Medical): No    Lack of Transportation (Non-Medical): No  Physical Activity: Not on file  Stress: Stress Concern Present (09/28/2020)   [employer] of Occupational Health - Occupational Stress Questionnaire    Feeling of Stress : Rather much  Social Connections: Not on file  Intimate Partner Violence: Not At Risk (07/21/2018)   Humiliation, Afraid, Rape, and Kick questionnaire    Fear of Current or Ex-Partner: No    Emotionally Abused: No    Physically Abused: No    Sexually Abused: No   No current facility-administered medications on file prior to encounter.   Current Outpatient Medications on File Prior to Encounter  Medication Sig Dispense Refill   ibuprofen  (ADVIL ) 600 MG tablet Take 1 tablet (600 mg total) by mouth every 4 (four) hours as needed for moderate  pain (pain score 4-6). 60 tablet 0   acetaminophen  (TYLENOL ) 500 MG tablet Take 2 tablets (1,000 mg total) by mouth every 8 (eight) hours as needed for moderate pain (pain score 4-6). 60 tablet 0   ferrous sulfate  325 (65 FE) MG tablet Take 1 tablet (325 mg total) by mouth every other day. 90 tablet 1   oxyCODONE  (ROXICODONE ) 5 MG immediate release tablet Take 1 tablet (5 mg total) by mouth every 6 (six) hours as needed for severe pain (pain score 7-10). 15 tablet 0   Prenatal Vit-Fe Fumarate-FA (PREPLUS) 27-1 MG TABS Take 1 tablet by mouth daily. 30 tablet 13   senna-docusate (SENOKOT-S)  8.6-50 MG tablet Take 2 tablets by mouth at bedtime as needed for mild constipation. 60 tablet 0   No Known Allergies  ROS:  Review of Systems  Constitutional:  Negative for chills, fatigue and fever.  Eyes:  Negative for visual disturbance.  Respiratory:  Negative for shortness of breath.   Cardiovascular:  Negative for chest pain.  Gastrointestinal:  Negative for abdominal pain, nausea and vomiting.  Genitourinary:  Negative for difficulty urinating, dysuria, flank pain, pelvic pain, vaginal bleeding, vaginal discharge and vaginal pain.  Skin:  Positive for wound.       Cesarean incision with bleeding  Neurological:  Negative for dizziness and headaches.  Psychiatric/Behavioral: Negative.       I have reviewed patient's Past Medical Hx, Surgical Hx, Family Hx, Social Hx, medications and allergies.   Physical Exam  Patient Vitals for the past 24 hrs:  BP Temp Temp src Pulse Resp SpO2  05/30/23 0101 138/82 -- -- 63 -- --  05/30/23 0050 -- -- -- -- -- 97 %  05/30/23 0046 (!) 148/88 -- -- 61 -- --  05/30/23 0001 122/81 -- -- 62 -- --  05/29/23 2346 (!) 140/86 -- -- 70 -- --  05/29/23 2330 139/86 98.2 F (36.8 C) Oral 75 18 100 %   Constitutional: Well-developed, well-nourished female in no acute distress.  Cardiovascular: normal rate Respiratory: normal effort GI: Abd soft, non-tender. Pos BS x 4 MS: Extremities nontender, no edema, normal ROM Neurologic: Alert and oriented x 4.  Integumentary:  Low transverse abdominal incision well approximated, no edema or erythema, with small amount of dark red bleeding GU: Neg CVAT.    LAB RESULTS Results for orders placed or performed during the hospital encounter of 05/29/23 (from the past 24 hours)  CBC     Status: Abnormal   Collection Time: 05/30/23  1:16 AM  Result Value Ref Range   WBC 7.6 4.0 - 10.5 K/uL   RBC 3.68 (L) 3.87 - 5.11 MIL/uL   Hemoglobin 10.4 (L) 12.0 - 15.0 g/dL   HCT 40.9 (L) 81.1 - 91.4 %   MCV 87.2 80.0  - 100.0 fL   MCH 28.3 26.0 - 34.0 pg   MCHC 32.4 30.0 - 36.0 g/dL   RDW 78.2 95.6 - 21.3 %   Platelets 314 150 - 400 K/uL   nRBC 0.0 0.0 - 0.2 %  Comprehensive metabolic panel with GFR     Status: Abnormal   Collection Time: 05/30/23  1:16 AM  Result Value Ref Range   Sodium 139 135 - 145 mmol/L   Potassium 4.6 3.5 - 5.1 mmol/L   Chloride 105 98 - 111 mmol/L   CO2 23 22 - 32 mmol/L   Glucose, Bld 86 70 - 99 mg/dL   BUN 16 6 - 20 mg/dL   Creatinine,  Ser 1.18 (H) 0.44 - 1.00 mg/dL   Calcium 9.3 8.9 - 16.1 mg/dL   Total Protein 7.1 6.5 - 8.1 g/dL   Albumin 3.1 (L) 3.5 - 5.0 g/dL   AST 18 15 - 41 U/L   ALT 23 0 - 44 U/L   Alkaline Phosphatase 73 38 - 126 U/L   Total Bilirubin 0.4 0.0 - 1.2 mg/dL   GFR, Estimated >09 >60 mL/min   Anion gap 11 5 - 15    --/--/O POS (04/09 0945)  IMAGING US  MFM OB FOLLOW UP Result Date: 05/08/2023 ----------------------------------------------------------------------  OBSTETRICS REPORT                       (Signed Final 05/08/2023 02:05 pm) ---------------------------------------------------------------------- Patient Info  ID #:       [id number]                          D.O.B.:  [birth year] (32 yrs)(F)  Name:       Alonzo Artis              Visit Date: 05/08/2023 01:18 pm ---------------------------------------------------------------------- Performed By  Attending:        Sal Crass MD         Ref. Address:     [address]                                                             [place], [place]                                                             [address]  Performed By:     Earnest Goad            [facility]                                                             [facility]  Referred By:      [facility] ---------------------------------------------------------------------- Orders  #  Description                           Code        Ordered By  1  US  MFM OB FOLLOW UP                   56213.08    Lenoard Rad ----------------------------------------------------------------------  #  Order #                     Accession #                Episode #  1  [id number]                   [id number]                 [id number] ---------------------------------------------------------------------- Indications  Obesity complicating pregnancy, third          O99.213  trimester (BMI 39)  History of cesarean delivery, currently        O34.219  pregnant  Poor obstetric history: Previous fetal growth  O09.299  restriction (FGR)  [redacted] weeks gestation of pregnancy                Z3A.37  Low Risk NIPS   Neg Horizon (2020) ---------------------------------------------------------------------- Fetal Evaluation  Num Of Fetuses:         1  Fetal Heart Rate(bpm):  141  Cardiac Activity:       Observed  Presentation:           Cephalic  Placenta:               Posterior  P. Cord Insertion:      Previously seen  Amniotic Fluid  AFI FV:      Within normal limits  AFI Sum(cm)     %Tile       Largest Pocket(cm)  11.53           36          4.6  RUQ(cm)       RLQ(cm)       LUQ(cm)        LLQ(cm)  1.93          2.01          2.99           4.6 ---------------------------------------------------------------------- Biometry  BPD:      88.1  mm     G. Age:  35w 4d         24  %    CI:        76.98   %    70 - 86                                                          FL/HC:      22.2   %    20.8 - 22.6  HC:       318   mm     G. Age:  35w 5d          5  %    HC/AC:  1.01        0.92 - 1.05  AC:       315   mm     G. Age:  35w 3d         18  %    FL/BPD:     80.2   %    71 - 87  FL:       70.7  mm     G. Age:  36w 2d         26  %    FL/AC:      22.4   %    20 - 24  LV:        5.6  mm  Est. FW:    2745  gm      6 lb 1 oz     21  %  ---------------------------------------------------------------------- OB History  Gravidity:    3         Term:   1         SAB:   1  Living:       1 ---------------------------------------------------------------------- Gestational Age  LMP:           38w 2d        Date:  08/13/22                   EDD:   05/20/23  U/S Today:     35w 5d                                        EDD:   06/07/23  Best:          37w 1d     Det. By:  Delphine Fiedler         EDD:   05/28/23                                      (10/06/22) ---------------------------------------------------------------------- Anatomy  Cranium:               Previously seen        Aortic Arch:            Previously seen  Cavum:                 Previously seen        Ductal Arch:            Previously seen  Ventricles:            Appears normal         Diaphragm:              Appears normal  Choroid Plexus:        Previously seen        Stomach:                Appears normal, left                                                                        sided  Cerebellum:  Previously seen        Abdomen:                Previously seen  Posterior Fossa:       Previously seen        Abdominal Wall:         Previously seen  Face:                  Orbits and profile     Cord Vessels:           Previously seen                         previously seen  Lips:                  Previously seen        Kidneys:                Appear normal  Thoracic:              Previously seen        Bladder:                Appears normal  Heart:                 Previously seen        Spine:                  Previously seen  RVOT:                  Previously seen        Upper Extremities:      Previously seen  LVOT:                  Previously seen        Lower Extremities:      Previously seen  Other:  Fetal anatomic survey complete on prior scans. ---------------------------------------------------------------------- Cervix Uterus Adnexa  Cervix  Not visualized (advanced GA >24wks)   Uterus  No abnormality visualized.  Right Ovary  Not visualized.  Left Ovary  Not visualized.  Cul De Sac  No free fluid seen.  Adnexa  No adnexal mass visualized ---------------------------------------------------------------------- Comments  This patient was seen for a follow up growth scan due to  maternal obesity with a BMI of 39.  She denies any problems  since her last exam.  She was informed that the fetal growth and amniotic fluid  level appears appropriate for her gestational age.  As the fetal growth is within normal limits, no further exams  were scheduled in our office. ----------------------------------------------------------------------                   Sal Crass, MD Electronically Signed Final Report   05/08/2023 02:05 pm ----------------------------------------------------------------------    MAU Management/MDM: Orders Placed This Encounter  Procedures   CBC   Comprehensive metabolic panel with GFR   Discharge patient Discharge disposition: 01-Home or Self Care; Discharge patient date: 05/30/2023   Discharge patient Discharge disposition: 01-Home or Self Care; Discharge patient date: 05/30/2023    No orders of the defined types were placed in this encounter.   Incision well appearing, no dehiscence noted but small/moderate amount of dark red bleeding soaking 2 x 2 guaze and onto pt gown in MAU. No evidence of infection.  Pt BP elevated x 2, with other BPs normal.  Consult Dr Donetta Furl with assessment  and findings.  Pressure dressing applied to incision x 48 hours. PEC labs drawn.  D/C home with return precautions. Keep scheduled appt at [facility] on 06/01/23.    ASSESSMENT 1. Open abdominal incision with drainage, initial encounter     PLAN Discharge home Allergies as of 05/30/2023   No Known Allergies      Medication List     TAKE these medications    acetaminophen  500 MG tablet Commonly known as: TYLENOL  Take 2 tablets (1,000 mg total) by mouth every 8 (eight) hours  as needed for moderate pain (pain score 4-6).   ferrous sulfate  325 (65 FE) MG tablet Take 1 tablet (325 mg total) by mouth every other day.   ibuprofen  600 MG tablet Commonly known as: ADVIL  Take 1 tablet (600 mg total) by mouth every 4 (four) hours as needed for moderate pain (pain score 4-6).   oxyCODONE  5 MG immediate release tablet Commonly known as: Roxicodone  Take 1 tablet (5 mg total) by mouth every 6 (six) hours as needed for severe pain (pain score 7-10).   PrePLUS 27-1 MG Tabs Take 1 tablet by mouth daily.   senna-docusate 8.6-50 MG tablet Commonly known as: Senokot-S Take 2 tablets by mouth at bedtime as needed for mild constipation.        Follow-up Information     [facility] Follow up on 06/01/2023.   Specialty: Obstetrics and Gynecology Why: As scheduled Contact information: [address] [place]  [address] [phone]        [facility] Follow up.   Specialty: Obstetrics and Gynecology Why: As needed for emergencies Contact information: [address] [place]  [phone]                Arlester Bence Certified Nurse-Midwife 05/30/2023  2:15 AM

## 2023-06-01 ENCOUNTER — Ambulatory Visit (INDEPENDENT_AMBULATORY_CARE_PROVIDER_SITE_OTHER)

## 2023-06-01 VITALS — BP 123/84 | HR 85 | Ht 59.0 in | Wt 208.0 lb

## 2023-06-01 DIAGNOSIS — N179 Acute kidney failure, unspecified: Secondary | ICD-10-CM

## 2023-06-01 NOTE — Progress Notes (Addendum)
 Subjective:     Kalima Saylor is a 33 y.o. female who presents to the clinic 1 weeks status post  LTCS  for  Incision Check . Eating a regular diet without difficulty. Bowel movements are normal. Pain is controlled without any medications.    Review of Systems Pertinent items are noted in HPI.    Objective:    BP 123/84 (BP Location: Left Arm, Cuff Size: Large)   Pulse 85   Ht 4\' 11"  (1.499 m)   Wt 208 lb (94.3 kg)   Breastfeeding Yes   BMI 42.01 kg/m  General:  alert  Abdomen: soft, bowel sounds active, non-tender  Incision:   healing well, no erythema, no hernia, no seroma, no swelling, no dehiscence, incision well approximated     Assessment:    Doing well postoperatively.   Plan:    1. Continue any current medications. 2. Wound care discussed. 3. Activity restrictions: none 4. Anticipated return to work: not applicable. 5. Follow up: 4 weeks for PP visit.   Mancel Seashore, RMA

## 2023-06-02 LAB — COMPREHENSIVE METABOLIC PANEL WITH GFR
ALT: 22 IU/L (ref 0–32)
AST: 18 IU/L (ref 0–40)
Albumin: 3.9 g/dL — ABNORMAL LOW (ref 4.0–5.0)
Alkaline Phosphatase: 96 IU/L (ref 44–121)
BUN/Creatinine Ratio: 15 (ref 9–23)
BUN: 17 mg/dL (ref 6–20)
Bilirubin Total: 0.2 mg/dL (ref 0.0–1.2)
CO2: 22 mmol/L (ref 20–29)
Calcium: 9.4 mg/dL (ref 8.7–10.2)
Chloride: 107 mmol/L — ABNORMAL HIGH (ref 96–106)
Creatinine, Ser: 1.11 mg/dL — ABNORMAL HIGH (ref 0.57–1.00)
Globulin, Total: 2.8 g/dL (ref 1.5–4.5)
Glucose: 74 mg/dL (ref 70–99)
Potassium: 4.6 mmol/L (ref 3.5–5.2)
Sodium: 141 mmol/L (ref 134–144)
Total Protein: 6.7 g/dL (ref 6.0–8.5)
eGFR: 69 mL/min/{1.73_m2} (ref >59)

## 2023-06-22 ENCOUNTER — Encounter: Payer: Self-pay | Admitting: Obstetrics and Gynecology

## 2023-06-22 ENCOUNTER — Ambulatory Visit: Admitting: Obstetrics and Gynecology

## 2023-06-22 NOTE — Progress Notes (Signed)
 Post Partum Visit Note  Kristina Alvarado is a 33 y.o. [id number] female who presents for a postpartum visit. She is 4 weeks postpartum following a repeat cesarean section.  I have fully reviewed the prenatal and intrapartum course. The delivery was at 39.1 gestational weeks.  Anesthesia: spinal. Postpartum course has been uncomplicated. Baby is doing well. Baby is feeding by both breast and bottle - Similac Advance. Bleeding no bleeding. Bowel function is normal. Bladder function is normal. Patient is sexually active. Contraception method is tubal ligation.   Postpartum depression screening: negative, score 0.   The pregnancy intention screening data noted above was reviewed. Potential methods of contraception were discussed. The patient elected to proceed with No data recorded.   Edinburgh Postnatal Depression Scale - 06/22/23 1518       Edinburgh Postnatal Depression Scale:  In the Past 7 Days   I have been able to laugh and see the funny side of things. 0    I have looked forward with enjoyment to things. 0    I have blamed myself unnecessarily when things went wrong. 0    I have been anxious or worried for no good reason. 0    I have felt scared or panicky for no good reason. 0    Things have been getting on top of me. 0    I have been so unhappy that I have had difficulty sleeping. 0    I have felt sad or miserable. 0    I have been so unhappy that I have been crying. 0    The thought of harming myself has occurred to me. 0    Edinburgh Postnatal Depression Scale Total 0             Health Maintenance Due  Topic Date Due   COVID-19 Vaccine (1 - 2024-25 season) Never done       Review of Systems Pertinent items noted in HPI and remainder of comprehensive ROS otherwise negative.  Objective:  BP 114/75   Pulse 87   Wt 216 lb (98 kg)   LMP 08/13/2022   BMI 43.63 kg/m    General:  alert, cooperative, and no distress   Breasts:  normal  Lungs: clear to  auscultation bilaterally  Heart:  regular rate and rhythm  Abdomen: soft, non-tender; bowel sounds normal; no masses,  no organomegaly   Wound well approximated incision  GU exam:  not indicated       Assessment:   Normal postpartum exam.   Plan:   Essential components of care per ACOG recommendations:  1.  Mood and well being: Patient with negative depression screening today. Reviewed local resources for support.  - Patient tobacco use? No.   - hx of drug use? No.    2. Infant care and feeding:  -Patient currently breastmilk feeding? Yes and patient reports decrease milk production due to poor latch. Patient does not want to pursue breastfeeding  -Social determinants of health (SDOH) reviewed in EPIC. No concerns  3. Sexuality, contraception and birth spacing - Patient does not want a pregnancy in the next year.  Desired family size is 2 children.  - Salpingectomy performed at the time of cesarean section  4. Sleep and fatigue -Encouraged family/partner/community support of 4 hrs of uninterrupted sleep to help with mood and fatigue  5. Physical Recovery  - Discussed patients delivery and complications.  - Patient has urinary incontinence? No. - Patient is safe to resume physical and sexual  activity  6.  Health Maintenance - HM due items addressed Yes - Last pap smear  Diagnosis  Date Value Ref Range Status  03/18/2022 (A)  Final   - Atypical squamous cells of undetermined significance (ASC-US )   Pap smear not done at today's visit.  -Breast Cancer screening indicated? No.   7. Chronic Disease/Pregnancy Condition follow up: None  - PCP follow up  Verlyn Goad, MD [facility], [facility]

## 2023-08-20 ENCOUNTER — Encounter: Payer: Self-pay | Admitting: Family Medicine

## 2023-08-20 ENCOUNTER — Ambulatory Visit (INDEPENDENT_AMBULATORY_CARE_PROVIDER_SITE_OTHER)

## 2023-08-20 ENCOUNTER — Ambulatory Visit: Admitting: Family Medicine

## 2023-08-20 VITALS — BP 112/78 | HR 90 | Temp 97.7°F | Ht 59.0 in | Wt 224.0 lb

## 2023-08-20 DIAGNOSIS — M25541 Pain in joints of right hand: Secondary | ICD-10-CM | POA: Diagnosis not present

## 2023-08-20 DIAGNOSIS — M67431 Ganglion, right wrist: Secondary | ICD-10-CM

## 2023-08-20 DIAGNOSIS — M25551 Pain in right hip: Secondary | ICD-10-CM

## 2023-08-20 DIAGNOSIS — M25531 Pain in right wrist: Secondary | ICD-10-CM | POA: Diagnosis not present

## 2023-08-20 MED ORDER — MELOXICAM 15 MG PO TABS: 15.0000 mg | ORAL_TABLET | Freq: Every day | ORAL | 0 refills | Status: DC

## 2023-08-20 NOTE — Patient Instructions (Signed)
 Please go downstairs for an x-ray of your right wrist before you leave.  Take the meloxicam  once daily with food.    Avoid any other over-the-counter pain medications except for Tylenol  if needed.  Use an ice pack, elevate your wrist and you may also use a wrist splint if this is helpful.  I referred you to a hand specialist and they will reach out to schedule a visit.

## 2023-08-20 NOTE — Progress Notes (Signed)
 Subjective:     Patient ID: Kristina Alvarado, female    DOB: [birth year], 33 y.o.   MRN: [id number]  Chief Complaint  Patient presents with   Wrist Pain    Right wrist keeps locking up and giving pain. Happening for about 2 months. Started off as every now and then but now constant. Noticed a lump on her wrist a few days ago   Hip Pain    Right hip hurting, can barely lay on right side. Going on for the last few weeks and hasn't been doing anything    Wrist Pain   Hip Pain     History of Present Illness         C/o right wrist pain and locking of thumb joint x 2 months along with a lump in her wrist   Right hip pain x 1 week. Lateral right hip pain. Painful to sleep on that hip.  No known injury. No numbness, tingling or weakness of RLE. No fever, chills.   Taking ibuprofen  with some relief.   Delivered a son via C-section in April. Is not breastfeeding.  LMP: last week    Health Maintenance Due  Topic Date Due   Hepatitis B Vaccines (1 of 3 - 19+ 3-dose series) Never done   HPV VACCINES (1 - 3-dose SCDM series) Never done   COVID-19 Vaccine (1 - 2024-25 season) Never done    Past Medical History:  Diagnosis Date   Anemia    Dry skin 08/01/2021   History of ectopic pregnancy 2019   Vitamin D  deficiency 08/01/2021    Past Surgical History:  Procedure Laterality Date   CESAREAN SECTION N/A 02/17/2019   Procedure: CESAREAN SECTION;  Surgeon: Alger Gong, MD;  Location: [facility];  Service: Obstetrics;  Laterality: N/A;   CESAREAN SECTION WITH BILATERAL TUBAL LIGATION Bilateral 05/22/2023   Procedure: CESAREAN SECTION, WITH BILATERAL TUBAL LIGATION;  Surgeon: Alger Gong, MD;  Location: [facility];  Service: Obstetrics;  Laterality: Bilateral;   DIAGNOSTIC LAPAROSCOPY WITH REMOVAL OF ECTOPIC PREGNANCY N/A 09/10/2017   Procedure: DIAGNOSTIC LAPAROSCOPY WITH REMOVAL OF ECTOPIC PREGNANCY RIGHT SALPENGECTOMY RIGHT;  Surgeon: Herchel Gloris LABOR, MD;  Location: [facility];  Service: Gynecology;  Laterality: N/A;    Family History  Problem Relation Age of Onset   Kidney disease Mother    Heart disease Mother    Asthma Mother    Hypertension Mother    Diabetes Mother        takes insulin   Asthma Father    Asthma Sister    Heart disease Maternal Aunt    Heart disease Maternal Grandmother     Social History   Socioeconomic History   Marital status: Single    Spouse name: Not on file   Number of children: 1   Years of education: Not on file   Highest education level: Some college, no degree  Occupational History    Comment: [employer]  Tobacco Use   Smoking status: Former    Types: Cigars    Quit date: 09/11/2022    Years since quitting: 0.9   Smokeless tobacco: Never   Tobacco comments:    Recently restarted due to stress 09/28/20  Vaping Use   Vaping status: Never Used  Substance and Sexual Activity   Alcohol use: Not Currently    Comment: a bottle of wine a day   Drug use: Not Currently    Types: Marijuana   Sexual activity: Not Currently  Partners: Male  Other Topics Concern   Not on file  Social History Narrative   Not on file   Social Drivers of Health   Financial Resource Strain: Low Risk  (08/16/2023)   Overall Financial Resource Strain (CARDIA)    Difficulty of Paying Living Expenses: Not hard at all  Food Insecurity: No Food Insecurity (08/16/2023)   Hunger Vital Sign    Worried About Running Out of Food in the Last Year: Never true    Ran Out of Food in the Last Year: Never true  Transportation Needs: No Transportation Needs (08/16/2023)   PRAPARE - Administrator, Civil Service (Medical): No    Lack of Transportation (Non-Medical): No  Physical Activity: Insufficiently Active (08/16/2023)   Exercise Vital Sign    Days of Exercise per Week: 3 days    Minutes of Exercise per Session: 30 min  Stress: No Stress Concern Present (08/16/2023)   [employer] of Occupational Health - Occupational Stress  Questionnaire    Feeling of Stress: Not at all  Social Connections: Moderately Integrated (08/16/2023)   Social Connection and Isolation Panel    Frequency of Communication with Friends and Family: More than three times a week    Frequency of Social Gatherings with Friends and Family: Three times a week    Attends Religious Services: 1 to 4 times per year    Active Member of Clubs or Organizations: No    Attends Banker Meetings: Not on file    Marital Status: Living with partner  Intimate Partner Violence: Not At Risk (07/21/2018)   Humiliation, Afraid, Rape, and Kick questionnaire    Fear of Current or Ex-Partner: No    Emotionally Abused: No    Physically Abused: No    Sexually Abused: No    Outpatient Medications Prior to Visit  Medication Sig Dispense Refill   acetaminophen  (TYLENOL ) 500 MG tablet Take 2 tablets (1,000 mg total) by mouth every 8 (eight) hours as needed for moderate pain (pain score 4-6). 60 tablet 0   ibuprofen  (ADVIL ) 600 MG tablet Take 1 tablet (600 mg total) by mouth every 4 (four) hours as needed for moderate pain (pain score 4-6). 60 tablet 0   ferrous sulfate  325 (65 FE) MG tablet Take 1 tablet (325 mg total) by mouth every other day. (Patient not taking: Reported on 08/20/2023) 90 tablet 1   oxyCODONE  (ROXICODONE ) 5 MG immediate release tablet Take 1 tablet (5 mg total) by mouth every 6 (six) hours as needed for severe pain (pain score 7-10). 15 tablet 0   Prenatal Vit-Fe Fumarate-FA (PREPLUS) 27-1 MG TABS Take 1 tablet by mouth daily. 30 tablet 13   senna-docusate (SENOKOT-S) 8.6-50 MG tablet Take 2 tablets by mouth at bedtime as needed for mild constipation. 60 tablet 0   No facility-administered medications prior to visit.    No Known Allergies  ROS See HPI    Objective:    Physical Exam Constitutional:      General: She is not in acute distress.    Appearance: She is not ill-appearing.  Eyes:     Extraocular Movements: Extraocular  movements intact.     Conjunctiva/sclera: Conjunctivae normal.  Cardiovascular:     Rate and Rhythm: Normal rate.  Pulmonary:     Effort: Pulmonary effort is normal.  Musculoskeletal:     Right elbow: Normal.     Right forearm: No swelling or tenderness.     Right wrist: Tenderness present. No  swelling. Decreased range of motion. Normal pulse.     Right hand: No swelling. Decreased range of motion. Decreased strength of thumb/finger opposition. Normal sensation. Normal capillary refill. Normal pulse.     Cervical back: Normal range of motion and neck supple.     Right hip: Tenderness present. No crepitus. Decreased range of motion. Normal strength.     Comments: Right hand and RLE neurovascularly intact  TTP over right lateral hip, bursa  No sign of acute infectious process. Normal gait   Skin:    General: Skin is warm and dry.  Neurological:     General: No focal deficit present.     Mental Status: She is alert and oriented to person, place, and time.  Psychiatric:        Mood and Affect: Mood normal.        Behavior: Behavior normal.        Thought Content: Thought content normal.      BP 112/78 (BP Location: Left Arm, Patient Position: Sitting)   Pulse 90   Temp 97.7 F (36.5 C) (Temporal)   Ht 4' 11 (1.499 m)   Wt 224 lb (101.6 kg)   LMP 08/11/2023 (Approximate)   SpO2 99%   Breastfeeding No   BMI 45.24 kg/m  Wt Readings from Last 3 Encounters:  08/20/23 224 lb (101.6 kg)  06/22/23 216 lb (98 kg)  06/01/23 208 lb (94.3 kg)       Assessment & Plan:   Problem List Items Addressed This Visit   None Visit Diagnoses       Acute pain of right wrist    -  Primary   Relevant Orders   DG Wrist Complete Right   Ambulatory referral to Hand Surgery     Acute right hip pain         Ganglion of right wrist       Relevant Orders   DG Wrist Complete Right   Ambulatory referral to Hand Surgery     Pain in thumb joint with movement of right hand       Relevant Orders    DG Wrist Complete Right   Ambulatory referral to Hand Surgery      Right wrist X ray ordered.  Meloxicam  prescribed.  RICE method discussed.  Use hand/wrist splint she has at home.  Referral to hand specialist  Suspect bursitis vs arthritis of right hip x 1 wk of symptoms.  Meloxicam  will be helpful. Follow up if any new or worsening symptoms.  Follow up for fasting CPE  I have discontinued Yolonda Seiden's PrePLUS, oxyCODONE , and senna-docusate. I am also having her start on meloxicam . Additionally, I am having her maintain her ferrous sulfate , ibuprofen , and acetaminophen .  Meds ordered this encounter  Medications   meloxicam  (MOBIC ) 15 MG tablet    Sig: Take 1 tablet (15 mg total) by mouth daily.    Dispense:  30 tablet    Refill:  0    Supervising Provider:   ROLLENE NORRIS A [[id number]]

## 2023-08-24 ENCOUNTER — Ambulatory Visit: Payer: Self-pay | Admitting: Family Medicine

## 2023-09-01 ENCOUNTER — Ambulatory Visit (INDEPENDENT_AMBULATORY_CARE_PROVIDER_SITE_OTHER): Admitting: Orthopaedic Surgery

## 2023-09-01 ENCOUNTER — Encounter: Payer: Self-pay | Admitting: Orthopaedic Surgery

## 2023-09-01 DIAGNOSIS — M654 Radial styloid tenosynovitis [de Quervain]: Secondary | ICD-10-CM | POA: Diagnosis not present

## 2023-09-01 MED ORDER — METHYLPREDNISOLONE 4 MG PO TBPK: ORAL_TABLET | ORAL | 0 refills | Status: AC

## 2023-09-01 NOTE — Progress Notes (Signed)
 Office Visit Note   Patient: Kristina Alvarado           Date of Birth: 03/31/1990           MRN: [id number] Visit Date: 09/01/2023              Requested by: Lendia Boby CROME, NP-C [address] [place],  [place] [address] PCP: Lendia Boby CROME, NP-C   Assessment & Plan: Visit Diagnoses:  1. De Quervain's tenosynovitis, right     Plan: History of Present Illness Kristina Alvarado is a 33 year old female who presents with wrist pain and a ganglion cyst.  She experiences wrist pain that began at the thumb and has worsened to involve the entire wrist. The wrist is tender with occasional popping sensations. Pain increases with movements like making a fist with the thumb inside. She has been at home caring for a newborn, which involves lifting and repetitive hand movements.  Physical Exam MUSCULOSKELETAL: Tenderness over the thumb and wrist. Pain with Finkelstein's test on the wrist.  Assessment and Plan Tendonitis of wrist Tendonitis due to repetitive overuse, with inflammation in the first tunnel of the wrist. - Provide thumb spica brace to immobilize and reduce inflammation. - Prescribe anti-inflammatory medication for 1-2 weeks. - Advise wearing brace during hand use to minimize movement. - Consider cortisone injection if no improvement.  Ganglion cyst of wrist, dorsal Benign ganglion cyst, currently symptomatic. - Consider surgical removal if cyst enlarges or becomes symptomatic.  Follow-Up Instructions: No follow-ups on file.   Orders:  No orders of the defined types were placed in this encounter.  Meds ordered this encounter  Medications   methylPREDNISolone  (MEDROL  DOSEPAK) 4 MG TBPK tablet    Sig: Take as directed    Dispense:  21 tablet    Refill:  0      Procedures: No procedures performed   Clinical Data: No additional findings.   Subjective: Chief Complaint  Patient presents with   Right Wrist - Pain    HPI  Review of Systems   Constitutional: Negative.   HENT: Negative.    Eyes: Negative.   Respiratory: Negative.    Cardiovascular: Negative.   Endocrine: Negative.   Musculoskeletal: Negative.   Neurological: Negative.   Hematological: Negative.   Psychiatric/Behavioral: Negative.    All other systems reviewed and are negative.    Objective: Vital Signs: LMP 08/11/2023 (Approximate)   Physical Exam Vitals and nursing note reviewed.  Constitutional:      Appearance: She is well-developed.  HENT:     Head: Atraumatic.     Nose: Nose normal.  Eyes:     Extraocular Movements: Extraocular movements intact.  Cardiovascular:     Pulses: Normal pulses.  Pulmonary:     Effort: Pulmonary effort is normal.  Abdominal:     Palpations: Abdomen is soft.  Musculoskeletal:     Cervical back: Neck supple.  Skin:    General: Skin is warm.     Capillary Refill: Capillary refill takes less than 2 seconds.  Neurological:     Mental Status: She is alert. Mental status is at baseline.  Psychiatric:        Behavior: Behavior normal.        Thought Content: Thought content normal.        Judgment: Judgment normal.     Ortho Exam  Specialty Comments:  No specialty comments available.  Imaging: No results found.   PMFS History: Patient Active Problem List  Diagnosis Date Noted   De Quervain's tenosynovitis, right 09/01/2023   S/P repeat low transverse C-section 05/22/2023   Pregnancy 05/22/2023   Obesity affecting pregnancy, antepartum 12/22/2022   History of cesarean delivery 11/13/2022   Supervision of other normal pregnancy, antepartum 10/16/2022   Acute left-sided low back pain without sciatica 08/01/2021   Fatigue 08/01/2021   Vitamin D  deficiency 08/01/2021   Abnormal TSH 08/18/2018   Past Medical History:  Diagnosis Date   Anemia    Dry skin 08/01/2021   History of ectopic pregnancy 2019   Vitamin D  deficiency 08/01/2021    Family History  Problem Relation Age of Onset   Kidney  disease Mother    Heart disease Mother    Asthma Mother    Hypertension Mother    Diabetes Mother        takes insulin   Asthma Father    Asthma Sister    Heart disease Maternal Aunt    Heart disease Maternal Grandmother     Past Surgical History:  Procedure Laterality Date   CESAREAN SECTION N/A 02/17/2019   Procedure: CESAREAN SECTION;  Surgeon: Alger Gong, MD;  Location: [facility];  Service: Obstetrics;  Laterality: N/A;   CESAREAN SECTION WITH BILATERAL TUBAL LIGATION Bilateral 05/22/2023   Procedure: CESAREAN SECTION, WITH BILATERAL TUBAL LIGATION;  Surgeon: Alger Gong, MD;  Location: [facility];  Service: Obstetrics;  Laterality: Bilateral;   DIAGNOSTIC LAPAROSCOPY WITH REMOVAL OF ECTOPIC PREGNANCY N/A 09/10/2017   Procedure: DIAGNOSTIC LAPAROSCOPY WITH REMOVAL OF ECTOPIC PREGNANCY RIGHT SALPENGECTOMY RIGHT;  Surgeon: Herchel Gloris LABOR, MD;  Location: [facility];  Service: Gynecology;  Laterality: N/A;   Social History   Occupational History    Comment: [employer]  Tobacco Use   Smoking status: Former    Types: Cigars    Quit date: 09/11/2022    Years since quitting: 0.9   Smokeless tobacco: Never   Tobacco comments:    Recently restarted due to stress 09/28/20  Vaping Use   Vaping status: Never Used  Substance and Sexual Activity   Alcohol use: Not Currently    Comment: a bottle of wine a day   Drug use: Not Currently    Types: Marijuana   Sexual activity: Not Currently    Partners: Male

## 2023-09-21 ENCOUNTER — Other Ambulatory Visit: Payer: Self-pay | Admitting: Family Medicine

## 2023-12-14 ENCOUNTER — Encounter: Payer: Self-pay | Admitting: Radiology

## 2024-01-21 ENCOUNTER — Other Ambulatory Visit (INDEPENDENT_AMBULATORY_CARE_PROVIDER_SITE_OTHER)

## 2024-01-21 ENCOUNTER — Ambulatory Visit: Admitting: Orthopaedic Surgery

## 2024-01-21 DIAGNOSIS — M25571 Pain in right ankle and joints of right foot: Secondary | ICD-10-CM | POA: Diagnosis not present

## 2024-01-21 MED ORDER — METHYLPREDNISOLONE 4 MG PO TBPK: ORAL_TABLET | ORAL | 0 refills | Status: AC

## 2024-01-21 MED ORDER — MELOXICAM 15 MG PO TABS: 15.0000 mg | ORAL_TABLET | Freq: Every day | ORAL | 2 refills | Status: AC

## 2024-01-21 NOTE — Progress Notes (Signed)
 Office Visit Note   Patient: Kristina Alvarado           Date of Birth: [birth year]           MRN: [id number] Visit Date: 01/21/2024              Requested by: Lendia Boby CROME, NP-C [address] [place],  [place] [address] PCP: Lendia Boby CROME, NP-C   Assessment & Plan: Visit Diagnoses:  1. Pain in right ankle and joints of right foot     Plan: History of Present Illness Kristina Alvarado is a 33 year old female who presents with three weeks of right foot and ankle pain.  For three weeks she has had persistent pain along the dorsal right foot with maximal tenderness over the tibialis anterior tendon, worsened by ambulation and weight-bearing.  She has intermittent shooting pain radiating proximally into the ankle with certain movements, occurs with routine walking, and is not linked to any trauma.  Tylenol  Arthritis gives only temporary relief. She is not using NSAIDs and is not currently taking meloxicam .  Physical Exam MUSCULOSKELETAL: Tenderness over the tibialis anterior tendon of the right foot.  Pain with ankle dorsiflexion.  No swelling.    Results Radiology Right foot and ankle X-ray (01/21/2024): Normal  Assessment and Plan Right anterior tibial tendinitis - Prescribed Medrol  Dosepak for six days. - Prescribed meloxicam  as needed post-Medrol  Dosepak. - Provided cam boot for one to two weeks or until symptomatic improvement. - Instructed to wean from boot as symptoms resolve.  Follow-Up Instructions: Return if symptoms worsen or fail to improve.   Orders:  Orders Placed This Encounter  Procedures   XR Ankle Complete Right   XR Foot Complete Right   Meds ordered this encounter  Medications   methylPREDNISolone  (MEDROL  DOSEPAK) 4 MG TBPK tablet    Sig: Take as directed    Dispense:  21 tablet    Refill:  0   meloxicam  (MOBIC ) 15 MG tablet    Sig: Take 1 tablet (15 mg total) by mouth daily.    Dispense:  14 tablet    Refill:  2       Procedures: No procedures performed   Clinical Data: No additional findings.   Subjective: Chief Complaint  Patient presents with   Right Ankle - Pain    HPI  Review of Systems  Constitutional: Negative.   HENT: Negative.    Eyes: Negative.   Respiratory: Negative.    Cardiovascular: Negative.   Endocrine: Negative.   Musculoskeletal: Negative.   Neurological: Negative.   Hematological: Negative.   Psychiatric/Behavioral: Negative.    All other systems reviewed and are negative.    Objective: Vital Signs: There were no vitals taken for this visit.  Physical Exam Vitals and nursing note reviewed.  Constitutional:      Appearance: She is well-developed.  HENT:     Head: Atraumatic.     Nose: Nose normal.  Eyes:     Extraocular Movements: Extraocular movements intact.  Cardiovascular:     Pulses: Normal pulses.  Pulmonary:     Effort: Pulmonary effort is normal.  Abdominal:     Palpations: Abdomen is soft.  Musculoskeletal:     Cervical back: Neck supple.  Skin:    General: Skin is warm.     Capillary Refill: Capillary refill takes less than 2 seconds.  Neurological:     Mental Status: She is alert. Mental status is at baseline.  Psychiatric:  Behavior: Behavior normal.        Thought Content: Thought content normal.        Judgment: Judgment normal.     Ortho Exam  Specialty Comments:  No specialty comments available.  Imaging: XR Ankle Complete Right Result Date: 01/21/2024 X-rays of the right ankle show no acute findings.  XR Foot Complete Right Result Date: 01/21/2024 No acute or structural abnormalities.    PMFS History: Patient Active Problem List   Diagnosis Date Noted   De Quervain's tenosynovitis, right 09/01/2023   S/P repeat low transverse C-section 05/22/2023   Pregnancy 05/22/2023   Obesity affecting pregnancy, antepartum 12/22/2022   History of cesarean delivery 11/13/2022   Supervision of other normal pregnancy,  antepartum 10/16/2022   Acute left-sided low back pain without sciatica 08/01/2021   Fatigue 08/01/2021   Vitamin D  deficiency 08/01/2021   Abnormal TSH 08/18/2018   Past Medical History:  Diagnosis Date   Anemia    Dry skin 08/01/2021   History of ectopic pregnancy 2019   Vitamin D  deficiency 08/01/2021    Family History  Problem Relation Age of Onset   Kidney disease Mother    Heart disease Mother    Asthma Mother    Hypertension Mother    Diabetes Mother        takes insulin   Asthma Father    Asthma Sister    Heart disease Maternal Aunt    Heart disease Maternal Grandmother     Past Surgical History:  Procedure Laterality Date   CESAREAN SECTION N/A 02/17/2019   Procedure: CESAREAN SECTION;  Surgeon: Alger Gong, MD;  Location: [facility];  Service: Obstetrics;  Laterality: N/A;   CESAREAN SECTION WITH BILATERAL TUBAL LIGATION Bilateral 05/22/2023   Procedure: CESAREAN SECTION, WITH BILATERAL TUBAL LIGATION;  Surgeon: Alger Gong, MD;  Location: [facility];  Service: Obstetrics;  Laterality: Bilateral;   DIAGNOSTIC LAPAROSCOPY WITH REMOVAL OF ECTOPIC PREGNANCY N/A 09/10/2017   Procedure: DIAGNOSTIC LAPAROSCOPY WITH REMOVAL OF ECTOPIC PREGNANCY RIGHT SALPENGECTOMY RIGHT;  Surgeon: Herchel Gloris LABOR, MD;  Location: [facility];  Service: Gynecology;  Laterality: N/A;   Social History   Occupational History    Comment: [employer]  Tobacco Use   Smoking status: Former    Types: Cigars    Quit date: 09/11/2022    Years since quitting: 1.3   Smokeless tobacco: Never   Tobacco comments:    Recently restarted due to stress 09/28/20  Vaping Use   Vaping status: Never Used  Substance and Sexual Activity   Alcohol use: Not Currently    Comment: a bottle of wine a day   Drug use: Not Currently    Types: Marijuana   Sexual activity: Not Currently    Partners: Male

## 2024-04-07 ENCOUNTER — Encounter: Admitting: Family Medicine
